# Patient Record
Sex: Female | Born: 1950 | Hispanic: No | Smoking: Former smoker
Health system: Southern US, Community
[De-identification: ages and names within clinical notes are randomized; demographics above are authoritative.]

## PROBLEM LIST (undated history)

## (undated) DIAGNOSIS — R7303 Prediabetes: Secondary | ICD-10-CM

## (undated) DIAGNOSIS — E079 Disorder of thyroid, unspecified: Secondary | ICD-10-CM

## (undated) DIAGNOSIS — T8859XA Other complications of anesthesia, initial encounter: Secondary | ICD-10-CM

## (undated) DIAGNOSIS — H269 Unspecified cataract: Secondary | ICD-10-CM

## (undated) DIAGNOSIS — F32A Depression, unspecified: Secondary | ICD-10-CM

## (undated) DIAGNOSIS — I10 Essential (primary) hypertension: Secondary | ICD-10-CM

## (undated) DIAGNOSIS — I454 Nonspecific intraventricular block: Secondary | ICD-10-CM

## (undated) DIAGNOSIS — E785 Hyperlipidemia, unspecified: Secondary | ICD-10-CM

## (undated) HISTORY — PX: CHOLECYSTECTOMY: SHX55

## (undated) HISTORY — PX: COSMETIC SURGERY: SHX468

## (undated) HISTORY — DX: Disorder of thyroid, unspecified: E07.9

## (undated) HISTORY — DX: Essential (primary) hypertension: I10

## (undated) HISTORY — DX: Prediabetes: R73.03

## (undated) HISTORY — PX: REDUCTION MAMMAPLASTY: SUR839

## (undated) HISTORY — PX: TONSILLECTOMY: SUR1361

## (undated) HISTORY — DX: Depression, unspecified: F32.A

## (undated) HISTORY — PX: EYE SURGERY: SHX253

## (undated) HISTORY — DX: Hyperlipidemia, unspecified: E78.5

## (undated) HISTORY — DX: Unspecified cataract: H26.9

## (undated) HISTORY — PX: BREAST SURGERY: SHX581

## (undated) HISTORY — PX: HERNIA REPAIR: SHX51

---

## 1978-12-21 DIAGNOSIS — E039 Hypothyroidism, unspecified: Secondary | ICD-10-CM

## 1978-12-21 HISTORY — DX: Hypothyroidism, unspecified: E03.9

## 1988-12-21 HISTORY — PX: NASAL SEPTUM SURGERY: SHX37

## 1993-12-21 DIAGNOSIS — D649 Anemia, unspecified: Secondary | ICD-10-CM

## 1993-12-21 HISTORY — DX: Anemia, unspecified: D64.9

## 2018-06-30 LAB — HM MAMMOGRAPHY

## 2018-07-11 LAB — HM PAP SMEAR: HM Pap smear: ABNORMAL

## 2019-09-25 ENCOUNTER — Ambulatory Visit (INDEPENDENT_AMBULATORY_CARE_PROVIDER_SITE_OTHER): Payer: Medicare HMO | Admitting: Nurse Practitioner

## 2019-09-25 ENCOUNTER — Encounter: Payer: Self-pay | Admitting: Nurse Practitioner

## 2019-09-25 ENCOUNTER — Other Ambulatory Visit: Payer: Self-pay

## 2019-09-25 VITALS — BP 132/80 | HR 61 | Temp 97.8°F | Ht 65.8 in | Wt 223.8 lb

## 2019-09-25 DIAGNOSIS — E782 Mixed hyperlipidemia: Secondary | ICD-10-CM

## 2019-09-25 DIAGNOSIS — E039 Hypothyroidism, unspecified: Secondary | ICD-10-CM

## 2019-09-25 DIAGNOSIS — H04123 Dry eye syndrome of bilateral lacrimal glands: Secondary | ICD-10-CM

## 2019-09-25 DIAGNOSIS — Z87898 Personal history of other specified conditions: Secondary | ICD-10-CM | POA: Diagnosis not present

## 2019-09-25 DIAGNOSIS — N182 Chronic kidney disease, stage 2 (mild): Secondary | ICD-10-CM

## 2019-09-25 DIAGNOSIS — I129 Hypertensive chronic kidney disease with stage 1 through stage 4 chronic kidney disease, or unspecified chronic kidney disease: Secondary | ICD-10-CM | POA: Diagnosis not present

## 2019-09-25 DIAGNOSIS — I10 Essential (primary) hypertension: Secondary | ICD-10-CM | POA: Insufficient documentation

## 2019-09-25 DIAGNOSIS — R7303 Prediabetes: Secondary | ICD-10-CM

## 2019-09-25 DIAGNOSIS — Z1231 Encounter for screening mammogram for malignant neoplasm of breast: Secondary | ICD-10-CM

## 2019-09-25 LAB — POCT URINALYSIS DIPSTICK
Bilirubin, UA: NEGATIVE
Blood, UA: NEGATIVE
Glucose, UA: NEGATIVE
Ketones, UA: NEGATIVE
Leukocytes, UA: NEGATIVE
Nitrite, UA: NEGATIVE
Protein, UA: NEGATIVE
Spec Grav, UA: 1.025 (ref 1.010–1.025)
Urobilinogen, UA: 0.2 E.U./dL
pH, UA: 5 (ref 5.0–8.0)

## 2019-09-25 LAB — POCT UA - MICROALBUMIN
Albumin/Creatinine Ratio, Urine, POC: 30
Creatinine, POC: 200 mg/dL
Microalbumin Ur, POC: 10 mg/L

## 2019-09-25 MED ORDER — LEVOTHYROXINE SODIUM 150 MCG PO TABS: 150.0000 ug | ORAL_TABLET | Freq: Every day | ORAL | 1 refills | Status: DC

## 2019-09-25 MED ORDER — SPIRONOLACTONE 50 MG PO TABS: 50.0000 mg | ORAL_TABLET | Freq: Every day | ORAL | 1 refills | Status: DC

## 2019-09-25 NOTE — Progress Notes (Signed)
Subjective:     Patient ID: Nancy [name] , female    DOB: [birth year] , 68 y.o.   MRN: [id number]   Chief Complaint  Patient presents with  . Establish Care  . Hypertension    patient needs a refill on spironlactone     HPI  Here to establish care - she relocated approximately from [place], [place] - Lucas Mallow MD - [phone].  The last time she was seen was January 2020.  She worked as an Tourist information centre manager, now retired.  Divorced. No children.  Brother live here.    She has lost 90 lbs over 1 1/2 years intentionally. She did this with diet and exercise.  She now has loose skin.     Hypertension This is a chronic problem. The current episode started more than 1 year ago. The problem is controlled. Pertinent negatives include no anxiety, chest pain, headaches or palpitations. There are no associated agents to hypertension. Risk factors for coronary artery disease include obesity (prediabetes). Past treatments include diuretics and lifestyle changes. There is no history of angina, kidney disease or CVA.  Diabetes She presents for her follow-up diabetic visit. Diabetes type: prediabetes. Pertinent negatives for hypoglycemia include no dizziness or headaches. Pertinent negatives for diabetes include no chest pain, no polydipsia, no polyphagia and no polyuria. There are no hypoglycemic complications. Pertinent negatives for diabetic complications include no CVA. Risk factors for coronary artery disease include hypertension and obesity. When asked about current treatments, none were reported. When asked about meal planning, she reported none. She has not had a previous visit with a dietitian. (Average blood sugars - 85)      Past Medical History:  Diagnosis Date  . Hyperlipidemia   . Hypertension   . Prediabetes   . Thyroid disease      Family History  Problem Relation Age of Onset  . Diabetes Mother   . Hypertension Mother   . Hypertension Father   . Heart disease Father   .  Diabetes Sister   . Kidney failure Sister   . Diabetes Brother   . Kidney disease Brother   . Diabetes Sister   . Diabetes Sister   . Asthma Sister   . Cancer Maternal Grandfather   . Cancer Paternal Grandfather      Current Outpatient Medications:  .  atorvastatin (LIPITOR) 20 MG tablet, Take 20 mg by mouth daily., Disp: , Rfl:  .  azelastine (OPTIVAR) 0.05 % ophthalmic solution, Place 1 drop into both eyes 2 (two) times daily., Disp: , Rfl:  .  glucose blood (FREESTYLE TEST STRIPS) test strip, 1 each by Other route as needed for other. Take 1 strip twice a day, Disp: , Rfl:  .  levothyroxine (SYNTHROID) 150 MCG tablet, Take 150 mcg by mouth daily before breakfast. Take 2 tablets on wed and Saturday and one tablet once a day the other days, Disp: , Rfl:  .  spironolactone (ALDACTONE) 50 MG tablet, Take 50 mg by mouth daily., Disp: , Rfl:  .  triamcinolone cream (KENALOG) 0.1 %, Apply 1 application topically as needed., Disp: , Rfl:    No Known Allergies   Review of Systems  Constitutional: Negative.   Respiratory: Negative.   Cardiovascular: Negative for chest pain, palpitations and leg swelling.  Endocrine: Negative for polydipsia, polyphagia and polyuria.  Allergic/Immunologic: Negative.   Neurological: Negative for dizziness and headaches.     Today's Vitals   09/25/19 0959  BP: 132/80  Pulse: 61  Temp: 97.8  F (36.6 C)  TempSrc: Oral  SpO2: 97%  Weight: 223 lb 12.8 oz (101.5 kg)  Height: 5' 5.8" (1.671 m)  PainSc: 0-No pain   Body mass index is 36.34 kg/m.   Objective:  Physical Exam Vitals signs reviewed.  Constitutional:      Appearance: Normal appearance.  HENT:     Head: Normocephalic.  Cardiovascular:     Rate and Rhythm: Normal rate and regular rhythm.     Pulses: Normal pulses.     Heart sounds: Normal heart sounds. No murmur.  Pulmonary:     Effort: Pulmonary effort is normal. No respiratory distress.     Breath sounds: Normal breath sounds.   Skin:    General: Skin is warm and dry.     Capillary Refill: Capillary refill takes less than 2 seconds.     Comments: Loose hanging skin noted to bilateral arms   Neurological:     General: No focal deficit present.     Mental Status: She is alert.  Psychiatric:        Mood and Affect: Mood normal.        Behavior: Behavior normal.        Thought Content: Thought content normal.        Judgment: Judgment normal.         Assessment And Plan:     1. Essential hypertension  Blood pressure is fairly controlled today.  - spironolactone (ALDACTONE) 50 MG tablet; Take 1 tablet (50 mg total) by mouth daily.  Dispense: 90 tablet; Refill: 1  2. Stage 2 chronic kidney disease  She reports a history of chronic kidney disease  Encouraged to avoid ibuprofen type medications  3. Mixed hyperlipidemia  Continue with current medications  Will check lipid panel  4. Dry eyes  Chronic, using eye drops regularly  5. Prediabetes  Will refer to opthalmology for her routine screening - Ambulatory referral to Ophthalmology  6. Encounter for screening mammogram for malignant neoplasm of breast  Pt instructed on Self Breast Exam.According to ACOG guidelines Women aged 91 and older are recommended to get an annual mammogram. Form completed and given to patient contact the [facility] for appointment scheduing.   Pt encouraged to get annual mammogram - MM Digital Screening; Future  7. Acquired hypothyroidism  Chronic,   Will check thyroid studies - levothyroxine (SYNTHROID) 150 MCG tablet; Take 1 tablet (150 mcg total) by mouth daily before breakfast.  Dispense: 90 tablet; Refill: 1  8. History of weight loss  Would like to consult about the excess skin since her intentional weight loss - Ambulatory referral to Plastic Surgery    [name], FNP    THE PATIENT IS ENCOURAGED TO PRACTICE SOCIAL DISTANCING DUE TO THE COVID-19 PANDEMIC.

## 2019-09-26 LAB — LIPID PANEL
Chol/HDL Ratio: 3.5 ratio (ref 0.0–4.4)
Cholesterol, Total: 235 mg/dL — ABNORMAL HIGH (ref 100–199)
HDL: 68 mg/dL (ref >39)
LDL Chol Calc (NIH): 151 mg/dL — ABNORMAL HIGH (ref 0–99)
Triglycerides: 92 mg/dL (ref 0–149)
VLDL Cholesterol Cal: 16 mg/dL (ref 5–40)

## 2019-09-26 LAB — TSH: TSH: 2.15 u[IU]/mL (ref 0.450–4.500)

## 2019-09-26 LAB — T3: T3, Total: 81 ng/dL (ref 71–180)

## 2019-10-02 ENCOUNTER — Other Ambulatory Visit: Payer: Self-pay

## 2019-10-02 ENCOUNTER — Other Ambulatory Visit: Payer: Medicare HMO

## 2019-10-02 ENCOUNTER — Other Ambulatory Visit: Payer: Self-pay | Admitting: Internal Medicine

## 2019-10-03 LAB — HEMOGLOBIN A1C
Est. average glucose Bld gHb Est-mCnc: 111 mg/dL
Hgb A1c MFr Bld: 5.5 % (ref 4.8–5.6)

## 2019-10-05 ENCOUNTER — Telehealth: Payer: Self-pay

## 2019-10-05 NOTE — Telephone Encounter (Signed)
Rodriguez-Southworth, Sunday Spillers, PA-C  [place], [place]        Please inform pt that her DM test is normal    Hemoglobin A1c Order: [id number] Status:  Final result Visible to patient:  No (not released)  Ref Range & Units 3d ago  Hgb A1c MFr Bld 4.8 - 5.6 % 5.5   Comment:     Prediabetes: 5.7 - 6.4      Diabetes: >6.4      Glycemic control for adults with diabetes: <7.0        Spoke w/pt

## 2019-10-23 DIAGNOSIS — H0288B Meibomian gland dysfunction left eye, upper and lower eyelids: Secondary | ICD-10-CM | POA: Diagnosis not present

## 2019-10-23 DIAGNOSIS — H25013 Cortical age-related cataract, bilateral: Secondary | ICD-10-CM | POA: Diagnosis not present

## 2019-10-23 DIAGNOSIS — H0288A Meibomian gland dysfunction right eye, upper and lower eyelids: Secondary | ICD-10-CM | POA: Diagnosis not present

## 2019-10-23 DIAGNOSIS — E119 Type 2 diabetes mellitus without complications: Secondary | ICD-10-CM | POA: Diagnosis not present

## 2019-10-23 DIAGNOSIS — H18413 Arcus senilis, bilateral: Secondary | ICD-10-CM | POA: Diagnosis not present

## 2019-10-23 DIAGNOSIS — H11153 Pinguecula, bilateral: Secondary | ICD-10-CM | POA: Diagnosis not present

## 2019-10-23 DIAGNOSIS — H2513 Age-related nuclear cataract, bilateral: Secondary | ICD-10-CM | POA: Diagnosis not present

## 2019-10-23 DIAGNOSIS — H11823 Conjunctivochalasis, bilateral: Secondary | ICD-10-CM | POA: Diagnosis not present

## 2019-10-23 LAB — HM DIABETES EYE EXAM

## 2019-10-25 ENCOUNTER — Encounter: Payer: Self-pay | Admitting: Nurse Practitioner

## 2019-10-31 ENCOUNTER — Other Ambulatory Visit: Payer: Self-pay | Admitting: Nurse Practitioner

## 2019-10-31 DIAGNOSIS — R69 Illness, unspecified: Secondary | ICD-10-CM | POA: Diagnosis not present

## 2019-11-06 DIAGNOSIS — M793 Panniculitis, unspecified: Secondary | ICD-10-CM | POA: Diagnosis not present

## 2019-11-06 DIAGNOSIS — E881 Lipodystrophy, not elsewhere classified: Secondary | ICD-10-CM | POA: Diagnosis not present

## 2019-12-05 ENCOUNTER — Telehealth: Payer: Self-pay

## 2019-12-05 NOTE — Telephone Encounter (Signed)
Patient called wanting some suggestions for diverticulitis and constipation.  I RETURNED PT CALL AND SCHEDULED HER AN APPOINTMENT. Nancy [name]

## 2019-12-06 ENCOUNTER — Encounter: Payer: Self-pay | Admitting: Nurse Practitioner

## 2019-12-06 ENCOUNTER — Ambulatory Visit (INDEPENDENT_AMBULATORY_CARE_PROVIDER_SITE_OTHER): Payer: Medicare HMO | Admitting: Nurse Practitioner

## 2019-12-06 ENCOUNTER — Ambulatory Visit
Admission: RE | Admit: 2019-12-06 | Discharge: 2019-12-06 | Disposition: A | Discharge status: Home / Self Care | Payer: Medicare HMO | Source: Ambulatory Visit | Attending: Nurse Practitioner | Admitting: Nurse Practitioner

## 2019-12-06 ENCOUNTER — Other Ambulatory Visit: Payer: Self-pay

## 2019-12-06 VITALS — BP 128/80 | HR 69 | Temp 97.9°F | Ht 65.8 in | Wt 218.0 lb

## 2019-12-06 DIAGNOSIS — Z1231 Encounter for screening mammogram for malignant neoplasm of breast: Secondary | ICD-10-CM | POA: Diagnosis not present

## 2019-12-06 DIAGNOSIS — K5900 Constipation, unspecified: Secondary | ICD-10-CM | POA: Diagnosis not present

## 2019-12-06 DIAGNOSIS — K5790 Diverticulosis of intestine, part unspecified, without perforation or abscess without bleeding: Secondary | ICD-10-CM | POA: Diagnosis not present

## 2019-12-06 DIAGNOSIS — Z8719 Personal history of other diseases of the digestive system: Secondary | ICD-10-CM

## 2019-12-06 NOTE — Progress Notes (Signed)
This visit occurred during the SARS-CoV-2 public health emergency.  Safety protocols were in place, including screening questions prior to the visit, additional usage of staff PPE, and extensive cleaning of exam room while observing appropriate contact time as indicated for disinfecting solutions.  Subjective:     Patient ID: Nancy [name] , female    DOB: 05/22/1951 , 68 y.o.   MRN: [id number]   Chief Complaint  Patient presents with  . Constipation    patient stated she has been having constipation since saturday. she stated she had a episode of diverticulitis     HPI  She seen Dr. Towanda Malkin for her abdominal hernia, scheduled to see him in January for possible surgery. She tells me she was diagnosed with diverticulitis - unable to complete colonoscopy with a CT scan found to have diverticulitis.   She is having bad abdominal pain, unsure of what triggers the pain. She had been eating a handful of nuts. Took 3 days to calm down.  Having a small amount of pain now.  She is trying to eat bland. Describes a sharp pain, hits and peaks then dies off.      Constipation This is a new problem. The current episode started in the past 7 days. The problem is unchanged. Her stool frequency is 2 to 3 times per week. The patient is not on a high fiber diet. She does not exercise regularly. There has been adequate water intake. Pertinent negatives include no abdominal pain (abdominal discomfort), back pain, bloating, diarrhea, fever, flatus, nausea (when she was having pain ) or vomiting. She has tried stool softeners (magnesium citrate 2 bottles, 2 dulcolax) for the symptoms.     Past Medical History:  Diagnosis Date  . Hyperlipidemia   . Hypertension   . Prediabetes   . Thyroid disease      Family History  Problem Relation Age of Onset  . Diabetes Mother   . Hypertension Mother   . Hypertension Father   . Heart disease Father   . Diabetes Sister   . Kidney failure Sister   . Diabetes  Brother   . Kidney disease Brother   . Diabetes Sister   . Diabetes Sister   . Asthma Sister   . Cancer Maternal Grandfather   . Cancer Paternal Grandfather      Current Outpatient Medications:  .  atorvastatin (LIPITOR) 20 MG tablet, TAKE 1 TABLET EVERY NIGHT AT BEDTIME FOR 90 DAY(S) FOR CHOLESTEROL, Disp: 90 tablet, Rfl: 0 .  azelastine (OPTIVAR) 0.05 % ophthalmic solution, Place 1 drop into both eyes 2 (two) times daily., Disp: , Rfl:  .  glucose blood (FREESTYLE TEST STRIPS) test strip, 1 each by Other route as needed for other. Take 1 strip twice a day, Disp: , Rfl:  .  levothyroxine (SYNTHROID) 150 MCG tablet, Take 1 tablet (150 mcg total) by mouth daily before breakfast., Disp: 90 tablet, Rfl: 1 .  spironolactone (ALDACTONE) 50 MG tablet, Take 1 tablet (50 mg total) by mouth daily., Disp: 90 tablet, Rfl: 1 .  triamcinolone cream (KENALOG) 0.1 %, TAKE 1 THIN LAYER TWICE A DAY AS NEEDED FOR 30 DAY(S), Disp: 60 g, Rfl: 0   No Known Allergies   Review of Systems  Constitutional: Negative for fatigue and fever.  Gastrointestinal: Positive for constipation. Negative for abdominal distention, abdominal pain (abdominal discomfort), bloating, diarrhea, flatus, nausea (when she was having pain ) and vomiting.  Genitourinary: Negative for frequency.  Musculoskeletal: Negative for back pain.  Neurological: Negative for headaches.  Psychiatric/Behavioral: Negative.      Today's Vitals   12/06/19 1138  BP: 128/80  Pulse: 69  Temp: 97.9 F (36.6 C)  TempSrc: Oral  Weight: 218 lb (98.9 kg)  Height: 5' 5.8" (1.671 m)  PainSc: 3   PainLoc: Abdomen   Body mass index is 35.4 kg/m.   Objective:  Physical Exam Constitutional:      General: She is not in acute distress.    Appearance: Normal appearance.  Cardiovascular:     Rate and Rhythm: Normal rate and regular rhythm.     Pulses: Normal pulses.     Heart sounds: Normal heart sounds. No murmur.  Pulmonary:     Effort:  Pulmonary effort is normal. No respiratory distress.     Breath sounds: Normal breath sounds.  Abdominal:     Palpations: Abdomen is soft.     Tenderness: There is abdominal tenderness (mild tenderness to lower abdomen area). There is no right CVA tenderness or left CVA tenderness.     Comments: Hypoactive bowel sounds  Neurological:     General: No focal deficit present.     Mental Status: She is alert and oriented to person, place, and time.     Cranial Nerves: No cranial nerve deficit.  Psychiatric:        Mood and Affect: Mood normal.        Behavior: Behavior normal.        Thought Content: Thought content normal.        Judgment: Judgment normal.         Assessment And Plan:     1. Constipation, unspecified constipation type  5 day history of no bowel movement  Given samples of linzess to take daily  She is also advised to take miralax 1 tsp daily with at least 8 oz of water  Increase fiber intake and can drink warmed prune juice or apple juice  Will obtain abdominal xray  She has hypoactive bowel sounds - DG Abd 1 View; Future  2. Diverticulosis  Previous history of diverticulosis  Advised to avoid foods with seeds and spicy foods  3. History of abdominal hernia  She is being follow by Dr. Smitty Pluck  May need to contact him since she has new onset constipation   [name], FNP    THE PATIENT IS ENCOURAGED TO PRACTICE SOCIAL DISTANCING DUE TO THE COVID-19 PANDEMIC.

## 2019-12-06 NOTE — Patient Instructions (Signed)
Diverticulosis  Diverticulosis is a condition that develops when small pouches (diverticula) form in the wall of the large intestine (colon). The colon is where water is absorbed and stool is formed. The pouches form when the inside layer of the colon pushes through weak spots in the outer layers of the colon. You may have a few pouches or many of them. What are the causes? The cause of this condition is not known. What increases the risk? The following factors may make you more likely to develop this condition:  Being older than age 60. Your risk for this condition increases with age. Diverticulosis is rare among people younger than age 30. By age 80, many people have it.  Eating a low-fiber diet.  Having frequent constipation.  Being overweight.  Not getting enough exercise.  Smoking.  Taking over-the-counter pain medicines, like aspirin and ibuprofen.  Having a family history of diverticulosis. What are the signs or symptoms? In most people, there are no symptoms of this condition. If you do have symptoms, they may include:  Bloating.  Cramps in the abdomen.  Constipation or diarrhea.  Pain in the lower left side of the abdomen. How is this diagnosed? This condition is most often diagnosed during an exam for other colon problems. Because diverticulosis usually has no symptoms, it often cannot be diagnosed independently. This condition may be diagnosed by:  Using a flexible scope to examine the colon (colonoscopy).  Taking an X-ray of the colon after dye has been put into the colon (barium enema).  Doing a CT scan. How is this treated? You may not need treatment for this condition if you have never developed an infection related to diverticulosis. If you have had an infection before, treatment may include:  Eating a high-fiber diet. This may include eating more fruits, vegetables, and grains.  Taking a fiber supplement.  Taking a live bacteria supplement (probiotic).   Taking medicine to relax your colon.  Taking antibiotic medicines. Follow these instructions at home:  Drink 6-8 glasses of water or more each day to prevent constipation.  Try not to strain when you have a bowel movement.  If you have had an infection before: ? Eat more fiber as directed by your health care provider or your diet and nutrition specialist (dietitian). ? Take a fiber supplement or probiotic, if your health care provider approves.  Take over-the-counter and prescription medicines only as told by your health care provider.  If you were prescribed an antibiotic, take it as told by your health care provider. Do not stop taking the antibiotic even if you start to feel better.  Keep all follow-up visits as told by your health care provider. This is important. Contact a health care provider if:  You have pain in your abdomen.  You have bloating.  You have cramps.  You have not had a bowel movement in 3 days. Get help right away if:  Your pain gets worse.  Your bloating becomes very bad.  You have a fever or chills, and your symptoms suddenly get worse.  You vomit.  You have bowel movements that are bloody or black.  You have bleeding from your rectum. Summary  Diverticulosis is a condition that develops when small pouches (diverticula) form in the wall of the large intestine (colon).  You may have a few pouches or many of them.  This condition is most often diagnosed during an exam for other colon problems.  If you have had an infection related to   diverticulosis, treatment may include increasing the fiber in your diet, taking supplements, or taking medicines. This information is not intended to replace advice given to you by your health care provider. Make sure you discuss any questions you have with your health care provider. Document Released: 09/03/2004 Document Revised: 11/19/2017 Document Reviewed: 10/26/2016 Elsevier Patient Education  [place]. Constipation, Adult Constipation is when a person:  Poops (has a bowel movement) fewer times in a week than normal.  Has a hard time pooping.  Has poop that is dry, hard, or bigger than normal. Follow these instructions at home: Eating and drinking   Eat foods that have a lot of fiber, such as: ? Fresh fruits and vegetables. ? Whole grains. ? Beans.  Eat less of foods that are high in fat, low in fiber, or overly processed, such as: ? [place] fries. ? Hamburgers. ? Cookies. ? Candy. ? Soda.  Drink enough fluid to keep your pee (urine) clear or pale yellow. General instructions  Exercise regularly or as told by your doctor.  Go to the restroom when you feel like you need to poop. Do not hold it in.  Take over-the-counter and prescription medicines only as told by your doctor. These include any fiber supplements.  Do pelvic floor retraining exercises, such as: ? Doing deep breathing while relaxing your lower belly (abdomen). ? Relaxing your pelvic floor while pooping.  Watch your condition for any changes.  Keep all follow-up visits as told by your doctor. This is important. Contact a doctor if:  You have pain that gets worse.  You have a fever.  You have not pooped for 4 days.  You throw up (vomit).  You are not hungry.  You lose weight.  You are bleeding from the anus.  You have thin, pencil-like poop (stool). Get help right away if:  You have a fever, and your symptoms suddenly get worse.  You leak poop or have blood in your poop.  Your belly feels hard or bigger than normal (is bloated).  You have very bad belly pain.  You feel dizzy or you faint. This information is not intended to replace advice given to you by your health care provider. Make sure you discuss any questions you have with your health care provider. Document Released: 05/25/2008 Document Revised: 11/19/2017 Document Reviewed: 05/27/2016 Elsevier Patient Education   2020 [employer].

## 2019-12-08 ENCOUNTER — Ambulatory Visit
Admission: RE | Admit: 2019-12-08 | Discharge: 2019-12-08 | Disposition: A | Discharge status: Home / Self Care | Payer: Medicare HMO | Source: Ambulatory Visit | Attending: Nurse Practitioner | Admitting: Nurse Practitioner

## 2019-12-08 ENCOUNTER — Other Ambulatory Visit: Payer: Self-pay

## 2019-12-08 DIAGNOSIS — R109 Unspecified abdominal pain: Secondary | ICD-10-CM | POA: Diagnosis not present

## 2019-12-08 DIAGNOSIS — K5900 Constipation, unspecified: Secondary | ICD-10-CM

## 2019-12-30 ENCOUNTER — Other Ambulatory Visit: Payer: Self-pay | Admitting: Nurse Practitioner

## 2019-12-30 DIAGNOSIS — E039 Hypothyroidism, unspecified: Secondary | ICD-10-CM

## 2020-01-03 DIAGNOSIS — K439 Ventral hernia without obstruction or gangrene: Secondary | ICD-10-CM | POA: Diagnosis not present

## 2020-01-09 ENCOUNTER — Ambulatory Visit: Payer: Medicare HMO

## 2020-01-09 ENCOUNTER — Encounter: Payer: Medicare HMO | Admitting: Nurse Practitioner

## 2020-01-11 ENCOUNTER — Ambulatory Visit (INDEPENDENT_AMBULATORY_CARE_PROVIDER_SITE_OTHER): Payer: Medicare HMO | Admitting: Nurse Practitioner

## 2020-01-11 ENCOUNTER — Encounter: Payer: Self-pay | Admitting: Nurse Practitioner

## 2020-01-11 ENCOUNTER — Other Ambulatory Visit: Payer: Self-pay

## 2020-01-11 ENCOUNTER — Ambulatory Visit (INDEPENDENT_AMBULATORY_CARE_PROVIDER_SITE_OTHER): Payer: Medicare HMO

## 2020-01-11 VITALS — BP 108/60 | HR 73 | Temp 97.6°F | Ht 65.4 in | Wt 206.0 lb

## 2020-01-11 VITALS — BP 108/60 | HR 73 | Temp 97.6°F | Ht 65.4 in | Wt 206.2 lb

## 2020-01-11 DIAGNOSIS — Z23 Encounter for immunization: Secondary | ICD-10-CM

## 2020-01-11 DIAGNOSIS — M25531 Pain in right wrist: Secondary | ICD-10-CM

## 2020-01-11 DIAGNOSIS — Z0001 Encounter for general adult medical examination with abnormal findings: Secondary | ICD-10-CM

## 2020-01-11 DIAGNOSIS — Z1159 Encounter for screening for other viral diseases: Secondary | ICD-10-CM

## 2020-01-11 DIAGNOSIS — I452 Bifascicular block: Secondary | ICD-10-CM

## 2020-01-11 DIAGNOSIS — E039 Hypothyroidism, unspecified: Secondary | ICD-10-CM | POA: Diagnosis not present

## 2020-01-11 DIAGNOSIS — R7303 Prediabetes: Secondary | ICD-10-CM | POA: Diagnosis not present

## 2020-01-11 DIAGNOSIS — R9431 Abnormal electrocardiogram [ECG] [EKG]: Secondary | ICD-10-CM | POA: Diagnosis not present

## 2020-01-11 DIAGNOSIS — I10 Essential (primary) hypertension: Secondary | ICD-10-CM

## 2020-01-11 DIAGNOSIS — Z0000 Encounter for general adult medical examination without abnormal findings: Secondary | ICD-10-CM | POA: Diagnosis not present

## 2020-01-11 DIAGNOSIS — K5900 Constipation, unspecified: Secondary | ICD-10-CM

## 2020-01-11 DIAGNOSIS — E782 Mixed hyperlipidemia: Secondary | ICD-10-CM | POA: Diagnosis not present

## 2020-01-11 DIAGNOSIS — Z01818 Encounter for other preprocedural examination: Secondary | ICD-10-CM | POA: Diagnosis not present

## 2020-01-11 MED ORDER — PREVNAR 13 IM SUSP: 0.5000 mL | INTRAMUSCULAR | 0 refills | Status: AC

## 2020-01-11 MED ORDER — TETANUS-DIPHTH-ACELL PERTUSSIS 5-2.5-18.5 LF-MCG/0.5 IM SUSP: 0.5000 mL | Freq: Once | INTRAMUSCULAR | 0 refills | Status: AC

## 2020-01-11 MED ORDER — LINACLOTIDE 72 MCG PO CAPS: 72.0000 ug | ORAL_CAPSULE | Freq: Every day | ORAL | 1 refills | Status: DC

## 2020-01-11 NOTE — Progress Notes (Signed)
This visit occurred during the SARS-CoV-2 public health emergency.  Safety protocols were in place, including screening questions prior to the visit, additional usage of staff PPE, and extensive cleaning of exam room while observing appropriate contact time as indicated for disinfecting solutions.  Subjective:     Patient ID: Nancy [name] , female    DOB: 01-16-1951 , 69 y.o.   MRN: [id number]   Chief Complaint  Patient presents with  . Annual Exam    HPI  Here for HM   She had a CT scan of her abdomen in less than a year and had a "twisted colon".    Dr. Towanda Malkin (plastic surgeon) is doing a panus on next Friday.  Would like a copy of physical note.     Dr. Thornell Mule (GI) in [facility] did her colonoscopy.   Hypertension This is a chronic problem. The current episode started more than 1 year ago. The problem is unchanged. The problem is controlled. Pertinent negatives include no anxiety, chest pain, headaches, palpitations or peripheral edema. There are no associated agents to hypertension. Risk factors for coronary artery disease include obesity and sedentary lifestyle. Past treatments include diuretics. There are no compliance problems.   Constipation The stool is described as firm. The patient is on a high fiber diet. She exercises regularly. There has been adequate water intake. Pertinent negatives include no abdominal pain, bloating or nausea. Treatments tried: linzess. The treatment provided moderate relief. There is no history of abdominal surgery. (Per patient history of twisted colon)    The patient states she uses post menopausal status for birth control. Mammogram last done 12/06/2019.  Negative for: breast discharge, breast lump(s), breast pain and breast self exam.  Pertinent negatives include abnormal bleeding (hematology), anxiety, decreased libido, depression, difficulty falling sleep, dyspareunia, history of infertility, nocturia, sexual dysfunction, sleep  disturbances, urinary incontinence, urinary urgency, vaginal discharge and vaginal itching. Diet regular, she is cutting back on carbs and meat intake. The patient states her exercise level moderate; exercises daily with walking for at least 30 minutes.     The patient's tobacco use is:  Social History   Tobacco Use  Smoking Status Former Smoker  Smokeless Tobacco Never Used   She has been exposed to passive smoke. The patient's alcohol use is:  Social History   Substance and Sexual Activity  Alcohol Use Yes   Comment: occasionally      Past Medical History:  Diagnosis Date  . Hyperlipidemia   . Hypertension   . Prediabetes   . Thyroid disease      Family History  Problem Relation Age of Onset  . Diabetes Mother   . Hypertension Mother   . Hypertension Father   . Heart disease Father   . Diabetes Sister   . Kidney failure Sister   . Diabetes Brother   . Kidney disease Brother   . Diabetes Sister   . Diabetes Sister   . Asthma Sister   . Cancer Maternal Grandfather   . Cancer Paternal Grandfather      Current Outpatient Medications:  .  atorvastatin (LIPITOR) 20 MG tablet, TAKE 1 TABLET EVERY NIGHT AT BEDTIME FOR 90 DAY(S) FOR CHOLESTEROL, Disp: 90 tablet, Rfl: 0 .  azelastine (OPTIVAR) 0.05 % ophthalmic solution, Place 1 drop into both eyes 2 (two) times daily., Disp: , Rfl:  .  glucose blood (FREESTYLE TEST STRIPS) test strip, 1 each by Other route as needed for other. Take 1 strip twice a day,  Disp: , Rfl:  .  levothyroxine (SYNTHROID) 150 MCG tablet, TAKE 2 TABLETS ON WED AND SATURDAY AND ONE TABLET ONCE A DAY THE OTHER DAYS, Disp: 180 tablet, Rfl: 0 .  spironolactone (ALDACTONE) 50 MG tablet, Take 1 tablet (50 mg total) by mouth daily., Disp: 90 tablet, Rfl: 1 .  triamcinolone cream (KENALOG) 0.1 %, TAKE 1 THIN LAYER TWICE A DAY AS NEEDED FOR 30 DAY(S), Disp: 60 g, Rfl: 0   No Known Allergies   Review of Systems  Constitutional: Negative.   HENT: Negative.    Eyes: Negative.   Respiratory: Negative.   Cardiovascular: Negative.  Negative for chest pain, palpitations and leg swelling.  Gastrointestinal: Positive for constipation. Negative for abdominal pain, bloating and nausea.  Endocrine: Negative.   Genitourinary: Negative.   Musculoskeletal: Negative.   Skin: Negative.   Neurological: Negative for dizziness and headaches.  Psychiatric/Behavioral: Negative.      Today's Vitals   01/11/20 0946  BP: 108/60  Pulse: 73  Temp: 97.6 F (36.4 C)  TempSrc: Oral  Weight: 206 lb 3.2 oz (93.5 kg)  Height: 5' 5.4" (1.661 m)  PainSc: 0-No pain   Body mass index is 33.9 kg/m.   Objective:  Physical Exam Constitutional:      General: She is not in acute distress.    Appearance: Normal appearance. She is obese.  HENT:     Head: Normocephalic and atraumatic.  Eyes:     Extraocular Movements: Extraocular movements intact.     Conjunctiva/sclera: Conjunctivae normal.     Pupils: Pupils are equal, round, and reactive to light.  Cardiovascular:     Rate and Rhythm: Normal rate and regular rhythm.     Pulses: Normal pulses.     Heart sounds: Normal heart sounds. No murmur.  Pulmonary:     Effort: Pulmonary effort is normal. No respiratory distress.     Breath sounds: Normal breath sounds.  Abdominal:     General: Bowel sounds are normal. There is no distension.     Palpations: Abdomen is soft.     Tenderness: There is no abdominal tenderness.  Musculoskeletal:        General: No tenderness. Normal range of motion.     Cervical back: Normal range of motion and neck supple. No tenderness.  Skin:    General: Skin is warm and dry.     Capillary Refill: Capillary refill takes less than 2 seconds.  Neurological:     General: No focal deficit present.     Mental Status: She is alert and oriented to person, place, and time.  Psychiatric:        Mood and Affect: Mood normal.        Behavior: Behavior normal.        Thought Content: Thought  content normal.        Judgment: Judgment normal.         Assessment And Plan:     1. Health maintenance examination . Behavior modifications discussed and diet history reviewed.   . Pt will continue to exercise regularly and modify diet with low GI, plant based foods and decrease intake of processed foods.  . Recommend intake of daily multivitamin, Vitamin D, and calcium.  . Recommend mammogram and colonoscopy for preventive screenings, as well as recommend immunizations that include influenza, TDAP, and Shingles  2. Encounter for hepatitis C screening test for low risk patient  Will check for Hepatitis C screening due to being born between the years  2992-4268 - Hepatitis C antibody  3. 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 Constipation, unspecified constipation type  Continue with Linzess daily - linaclotide (LINZESS) 72 MCG capsule; Take 1 capsule (72 mcg total) by mouth daily before breakfast.  Dispense: 90 capsule; Refill: 1  5. Mixed hyperlipidemia Chronic, controlled Continue with current medications - CMP14+EGFR - Lipid panel  6. Prediabetes  Chronic, stable  Continue to eat a healthy diet low in sugar and starches and regular physical activity - Hemoglobin A1c  7. Pre-operative clearance  Pending labs she can be cleared medically for surgery, I am referring to Cardiology for an evaluation due to abnormal EKG - CBC with Differential/Platelet  8. Acquired hypothyroidism  Chronic, controlled  Continue with current medications - TSH - T4 - T3, free  9. Encounter for immunization  Will give tetanus vaccine today while in  office. Refer to order management. TDAP will be administered to adults 75-69 years old every 10 years. - Tdap (BOOSTRIX) 5-2.5-18.5 LF-MCG/0.5 injection; Inject 0.5 mLs into the muscle once for 1 dose.  Dispense: 0.5 mL; Refill: 0  10. Right wrist pain  Bursitis vs arthritis vs carpal tunnel  Rx given for wrist splint  11. 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  Needs to be cleared before her surgery       [name], FNP    THE PATIENT IS ENCOURAGED TO PRACTICE SOCIAL DISTANCING DUE TO THE COVID-19 PANDEMIC.

## 2020-01-11 NOTE — Patient Instructions (Signed)
Nancy [name] , Thank you for taking time to come for your Medicare Wellness Visit. I appreciate your ongoing commitment to your health goals. Please review the following plan we discussed and let me know if I can assist you in the future.   Screening recommendations/referrals: Colonoscopy: 2020 per patient Mammogram: 11/2019 Bone Density: due Recommended yearly ophthalmology/optometry visit for glaucoma screening and checkup Recommended yearly dental visit for hygiene and checkup  Vaccinations: Influenza vaccine: 07/2019 Pneumococcal vaccine: sent to pharmacy Tdap vaccine: sent to pharmacy Shingles vaccine: discussed    Advanced directives: Advance directive discussed with you today. I have provided a copy for you to complete at home and have notarized. Once this is complete please bring a copy in to our office so we can scan it into your chart.   Conditions/risks identified: obesity  Next appointment:    Preventive Care 90 Years and Older, Female Preventive care refers to lifestyle choices and visits with your health care provider that can promote health and wellness. What does preventive care include?  A yearly physical exam. This is also called an annual well check.  Dental exams once or twice a year.  Routine eye exams. Ask your health care provider how often you should have your eyes checked.  Personal lifestyle choices, including:  Daily care of your teeth and gums.  Regular physical activity.  Eating a healthy diet.  Avoiding tobacco and drug use.  Limiting alcohol use.  Practicing safe sex.  Taking low-dose aspirin every day.  Taking vitamin and mineral supplements as recommended by your health care provider. What happens during an annual well check? The services and screenings done by your health care provider during your annual well check will depend on your age, overall health, lifestyle risk factors, and family history of disease. Counseling  Your health  care provider may ask you questions about your:  Alcohol use.  Tobacco use.  Drug use.  Emotional well-being.  Home and relationship well-being.  Sexual activity.  Eating habits.  History of falls.  Memory and ability to understand (cognition).  Work and work Statistician.  Reproductive health. Screening  You may have the following tests or measurements:  Height, weight, and BMI.  Blood pressure.  Lipid and cholesterol levels. These may be checked every 5 years, or more frequently if you are over 67 years old.  Skin check.  Lung cancer screening. You may have this screening every year starting at age 60 if you have a 30-pack-year history of smoking and currently smoke or have quit within the past 15 years.  Fecal occult blood test (FOBT) of the stool. You may have this test every year starting at age 46.  Flexible sigmoidoscopy or colonoscopy. You may have a sigmoidoscopy every 5 years or a colonoscopy every 10 years starting at age 85.  Hepatitis C blood test.  Hepatitis B blood test.  Sexually transmitted disease (STD) testing.  Diabetes screening. This is done by checking your blood sugar (glucose) after you have not eaten for a while (fasting). You may have this done every 1-3 years.  Bone density scan. This is done to screen for osteoporosis. You may have this done starting at age 14.  Mammogram. This may be done every 1-2 years. Talk to your health care provider about how often you should have regular mammograms. Talk with your health care provider about your test results, treatment options, and if necessary, the need for more tests. Vaccines  Your health care provider may recommend certain  vaccines, such as:  Influenza vaccine. This is recommended every year.  Tetanus, diphtheria, and acellular pertussis (Tdap, Td) vaccine. You may need a Td booster every 10 years.  Zoster vaccine. You may need this after age 3.  Pneumococcal 13-valent conjugate  (PCV13) vaccine. One dose is recommended after age 76.  Pneumococcal polysaccharide (PPSV23) vaccine. One dose is recommended after age 48. Talk to your health care provider about which screenings and vaccines you need and how often you need them. This information is not intended to replace advice given to you by your health care provider. Make sure you discuss any questions you have with your health care provider. Document Released: 01/03/2016 Document Revised: 08/26/2016 Document Reviewed: 10/08/2015 Elsevier Interactive Patient Education  2017 [place] Prevention in the Home Falls can cause injuries. They can happen to people of all ages. There are many things you can do to make your home safe and to help prevent falls. What can I do on the outside of my home?  Regularly fix the edges of walkways and driveways and fix any cracks.  Remove anything that might make you trip as you walk through a door, such as a raised step or threshold.  Trim any bushes or trees on the path to your home.  Use bright outdoor lighting.  Clear any walking paths of anything that might make someone trip, such as rocks or tools.  Regularly check to see if handrails are loose or broken. Make sure that both sides of any steps have handrails.  Any raised decks and porches should have guardrails on the edges.  Have any leaves, snow, or ice cleared regularly.  Use sand or salt on walking paths during winter.  Clean up any spills in your garage right away. This includes oil or grease spills. What can I do in the bathroom?  Use night lights.  Install grab bars by the toilet and in the tub and shower. Do not use towel bars as grab bars.  Use non-skid mats or decals in the tub or shower.  If you need to sit down in the shower, use a plastic, non-slip stool.  Keep the floor dry. Clean up any water that spills on the floor as soon as it happens.  Remove soap buildup in the tub or shower  regularly.  Attach bath mats securely with double-sided non-slip rug tape.  Do not have throw rugs and other things on the floor that can make you trip. What can I do in the bedroom?  Use night lights.  Make sure that you have a light by your bed that is easy to reach.  Do not use any sheets or blankets that are too big for your bed. They should not hang down onto the floor.  Have a firm chair that has side arms. You can use this for support while you get dressed.  Do not have throw rugs and other things on the floor that can make you trip. What can I do in the kitchen?  Clean up any spills right away.  Avoid walking on wet floors.  Keep items that you use a lot in easy-to-reach places.  If you need to reach something above you, use a strong step stool that has a grab bar.  Keep electrical cords out of the way.  Do not use floor polish or wax that makes floors slippery. If you must use wax, use non-skid floor wax.  Do not have throw rugs and other things  on the floor that can make you trip. What can I do with my stairs?  Do not leave any items on the stairs.  Make sure that there are handrails on both sides of the stairs and use them. Fix handrails that are broken or loose. Make sure that handrails are as long as the stairways.  Check any carpeting to make sure that it is firmly attached to the stairs. Fix any carpet that is loose or worn.  Avoid having throw rugs at the top or bottom of the stairs. If you do have throw rugs, attach them to the floor with carpet tape.  Make sure that you have a light switch at the top of the stairs and the bottom of the stairs. If you do not have them, ask someone to add them for you. What else can I do to help prevent falls?  Wear shoes that:  Do not have high heels.  Have rubber bottoms.  Are comfortable and fit you well.  Are closed at the toe. Do not wear sandals.  If you use a stepladder:  Make sure that it is fully  opened. Do not climb a closed stepladder.  Make sure that both sides of the stepladder are locked into place.  Ask someone to hold it for you, if possible.  Clearly mark and make sure that you can see:  Any grab bars or handrails.  First and last steps.  Where the edge of each step is.  Use tools that help you move around (mobility aids) if they are needed. These include:  Canes.  Walkers.  Scooters.  Crutches.  Turn on the lights when you go into a dark area. Replace any light bulbs as soon as they burn out.  Set up your furniture so you have a clear path. Avoid moving your furniture around.  If any of your floors are uneven, fix them.  If there are any pets around you, be aware of where they are.  Review your medicines with your doctor. Some medicines can make you feel dizzy. This can increase your chance of falling. Ask your doctor what other things that you can do to help prevent falls. This information is not intended to replace advice given to you by your health care provider. Make sure you discuss any questions you have with your health care provider. Document Released: 10/03/2009 Document Revised: 05/14/2016 Document Reviewed: 01/11/2015 Elsevier Interactive Patient Education  2017 [employer].

## 2020-01-11 NOTE — Progress Notes (Signed)
This visit occurred during the SARS-CoV-2 public health emergency.  Safety protocols were in place, including screening questions prior to the visit, additional usage of staff PPE, and extensive cleaning of exam room while observing appropriate contact time as indicated for disinfecting solutions.  Subjective:   Nancy [name] is a 69 y.o. female who presents for Medicare Annual (Subsequent) preventive examination.  Review of Systems:  n/a Cardiac Risk Factors include: advanced age (>65men, >25 women);dyslipidemia;obesity (BMI >30kg/m2);hypertension     Objective:     Vitals: BP 108/60 (BP Location: Left Arm, Patient Position: Sitting)   Pulse 73   Temp 97.6 F (36.4 C) (Oral)   Ht 5' 5.4" (1.661 m)   Wt 206 lb (93.4 kg)   BMI 33.86 kg/m   Body mass index is 33.86 kg/m.  Advanced Directives 01/11/2020  Does Patient Have a Medical Advance Directive? No  Would patient like information on creating a medical advance directive? Yes (MAU/Ambulatory/Procedural Areas - Information given)    Tobacco Social History   Tobacco Use  Smoking Status Former Smoker  Smokeless Tobacco Never Used     Counseling given: Not Answered   Clinical Intake:  Pre-visit preparation completed: Yes  Pain : No/denies pain     Nutritional Status: BMI > 30  Obese Nutritional Risks: None Diabetes: No  How often do you need to have someone help you when you read instructions, pamphlets, or other written materials from your doctor or pharmacy?: 1 - Never What is the last grade level you completed in school?: master's degree  Interpreter Needed?: No  Information entered by :: NAllen LPN  Past Medical History:  Diagnosis Date  . Hyperlipidemia   . Hypertension   . Prediabetes   . Thyroid disease    Past Surgical History:  Procedure Laterality Date  . BREAST SURGERY    . CHOLECYSTECTOMY    . REDUCTION MAMMAPLASTY    . TONSILLECTOMY     Family History  Problem Relation Age of Onset  .  Diabetes Mother   . Hypertension Mother   . Hypertension Father   . Heart disease Father   . Diabetes Sister   . Kidney failure Sister   . Diabetes Brother   . Kidney disease Brother   . Diabetes Sister   . Diabetes Sister   . Asthma Sister   . Cancer Maternal Grandfather   . Cancer Paternal Grandfather    Social History   Socioeconomic History  . Marital status: Unknown    Spouse name: Not on file  . Number of children: Not on file  . Years of education: Not on file  . Highest education level: Not on file  Occupational History  . Occupation: retired  Tobacco Use  . Smoking status: Former Research scientist (life sciences)  . Smokeless tobacco: Never Used  Substance and Sexual Activity  . Alcohol use: Not Currently  . Drug use: Never  . Sexual activity: Not Currently  Other Topics Concern  . Not on file  Social History Narrative  . Not on file   Social Determinants of Health   Financial Resource Strain: Low Risk   . Difficulty of Paying Living Expenses: Not hard at all  Food Insecurity: No Food Insecurity  . Worried About Charity fundraiser in the Last Year: Never true  . Ran Out of Food in the Last Year: Never true  Transportation Needs: No Transportation Needs  . Lack of Transportation (Medical): No  . Lack of Transportation (Non-Medical): No  Physical Activity: Sufficiently  Active  . Days of Exercise per Week: 7 days  . Minutes of Exercise per Session: 90 min  Stress: No Stress Concern Present  . Feeling of Stress : Not at all  Social Connections:   . Frequency of Communication with Friends and Family: Not on file  . Frequency of Social Gatherings with Friends and Family: Not on file  . Attends Religious Services: Not on file  . Active Member of Clubs or Organizations: Not on file  . Attends Archivist Meetings: Not on file  . Marital Status: Not on file    Outpatient Encounter Medications as of 01/11/2020  Medication Sig  . atorvastatin (LIPITOR) 20 MG tablet TAKE 1  TABLET EVERY NIGHT AT BEDTIME FOR 90 DAY(S) FOR CHOLESTEROL  . azelastine (OPTIVAR) 0.05 % ophthalmic solution Place 1 drop into both eyes 2 (two) times daily.  Marland Kitchen glucose blood (FREESTYLE TEST STRIPS) test strip 1 each by Other route as needed for other. Take 1 strip twice a day  . levothyroxine (SYNTHROID) 150 MCG tablet TAKE 2 TABLETS ON WED AND SATURDAY AND ONE TABLET ONCE A DAY THE OTHER DAYS  . linaclotide (LINZESS) 72 MCG capsule Take 1 capsule (72 mcg total) by mouth daily before breakfast.  . pneumococcal 13-valent conjugate vaccine (PREVNAR 13) SUSP injection Inject 0.5 mLs into the muscle tomorrow at 10 am for 1 dose.  . spironolactone (ALDACTONE) 50 MG tablet Take 1 tablet (50 mg total) by mouth daily.  Marland Kitchen triamcinolone cream (KENALOG) 0.1 % TAKE 1 THIN LAYER TWICE A DAY AS NEEDED FOR 30 DAY(S)   No facility-administered encounter medications on file as of 01/11/2020.    Activities of Daily Living In your present state of health, do you have any difficulty performing the following activities: 01/11/2020 09/25/2019  Hearing? N N  Vision? N N  Difficulty concentrating or making decisions? N N  Walking or climbing stairs? N N  Dressing or bathing? N N  Doing errands, shopping? N N  Preparing Food and eating ? N -  Using the Toilet? N -  In the past six months, have you accidently leaked urine? N -  Do you have problems with loss of bowel control? N -  Managing your Medications? N -  Managing your Finances? N -  Housekeeping or managing your Housekeeping? N -    Patient Care Team: [name], FNP as PCP - General (General Practice)    Assessment:   This is a routine wellness examination for Merna.  Exercise Activities and Dietary recommendations Current Exercise Habits: Home exercise routine, Type of exercise: walking, Time (Minutes): > 60, Frequency (Times/Week): 7, Weekly Exercise (Minutes/Week): 0  Goals    . Patient Stated     01/11/2020, wants to get down to 160-170  pounds       Fall Risk Fall Risk  01/11/2020 01/11/2020 12/06/2019 09/25/2019  Falls in the past year? 1 0 0 1  Comment tripped - - -  Number falls in past yr: - - - 0  Injury with Fall? - - - 0  Risk for fall due to : History of fall(s) - - -  Follow up Falls evaluation completed;Education provided;Falls prevention discussed - - -   Is the patient's home free of loose throw rugs in walkways, pet beds, electrical cords, etc?   yes      Grab bars in the bathroom? yes      Handrails on the stairs?   n/a  Adequate lighting?   yes  Timed Get Up and Go performed: n/a  Depression Screen PHQ 2/9 Scores 01/11/2020 01/11/2020 12/06/2019 09/25/2019  PHQ - 2 Score 0 0 0 0  PHQ- 9 Score 0 - - -     Cognitive Function     6CIT Screen 01/11/2020  What Year? 0 points  What month? 0 points  What time? 0 points  Count back from 20 0 points  Months in reverse 0 points  Repeat phrase 0 points  Total Score 0    Immunization History  Administered Date(s) Administered  . Influenza,inj,Quad PF,6+ Mos 08/13/2019    Qualifies for Shingles Vaccine? yes  Screening Tests Health Maintenance  Topic Date Due  . TETANUS/TDAP  04/30/1970  . DEXA SCAN  04/30/2016  . PNA vac Low Risk Adult (1 of 2 - PCV13) 04/30/2016  . COLONOSCOPY  01/10/2021 (Originally 04/30/2001)  . HEMOGLOBIN A1C  04/01/2020  . URINE MICROALBUMIN  09/24/2020  . OPHTHALMOLOGY EXAM  10/22/2020  . FOOT EXAM  01/10/2021  . MAMMOGRAM  12/05/2021  . INFLUENZA VACCINE  Completed  . Hepatitis C Screening  Completed    Cancer Screenings: Lung: Low Dose CT Chest recommended if Age 38-80 years, 30 pack-year currently smoking OR have quit w/in 15years. Patient does not qualify. Breast:  Up to date on Mammogram? Yes   Up to date of Bone Density/Dexa? No Colorectal: up to date  Additional Screenings: : Hepatitis C Screening: today     Plan:    Patient would like to get more in to exercise and weigh 160-170 pounds.   I  have personally reviewed and noted the following in the patient's chart:   . Medical and social history . Use of alcohol, tobacco or illicit drugs  . Current medications and supplements . Functional ability and status . Nutritional status . Physical activity . Advanced directives . List of other physicians . Hospitalizations, surgeries, and ER visits in previous 12 months . Vitals . Screenings to include cognitive, depression, and falls . Referrals and appointments  In addition, I have reviewed and discussed with patient certain preventive protocols, quality metrics, and best practice recommendations. A written personalized care plan for preventive services as well as general preventive health recommendations were provided to patient.     Kellie Simmering, LPN  [phone]

## 2020-01-12 LAB — HEMOGLOBIN A1C
Est. average glucose Bld gHb Est-mCnc: 117 mg/dL
Hgb A1c MFr Bld: 5.7 % — ABNORMAL HIGH (ref 4.8–5.6)

## 2020-01-12 LAB — CBC WITH DIFFERENTIAL/PLATELET
Basophils Absolute: 0.1 10*3/uL (ref 0.0–0.2)
Basos: 1 %
EOS (ABSOLUTE): 0.2 10*3/uL (ref 0.0–0.4)
Eos: 3 %
Hematocrit: 44.7 % (ref 34.0–46.6)
Hemoglobin: 14.9 g/dL (ref 11.1–15.9)
Immature Grans (Abs): 0 10*3/uL (ref 0.0–0.1)
Immature Granulocytes: 0 %
Lymphocytes Absolute: 1.9 10*3/uL (ref 0.7–3.1)
Lymphs: 32 %
MCH: 30.3 pg (ref 26.6–33.0)
MCHC: 33.3 g/dL (ref 31.5–35.7)
MCV: 91 fL (ref 79–97)
Monocytes Absolute: 0.4 10*3/uL (ref 0.1–0.9)
Monocytes: 7 %
Neutrophils Absolute: 3.4 10*3/uL (ref 1.4–7.0)
Neutrophils: 57 %
Platelets: 244 10*3/uL (ref 150–450)
RBC: 4.92 x10E6/uL (ref 3.77–5.28)
RDW: 13.5 % (ref 11.7–15.4)
WBC: 6 10*3/uL (ref 3.4–10.8)

## 2020-01-12 LAB — CMP14+EGFR
ALT: 16 IU/L (ref 0–32)
AST: 20 IU/L (ref 0–40)
Albumin/Globulin Ratio: 1.8 (ref 1.2–2.2)
Albumin: 4.7 g/dL (ref 3.8–4.8)
Alkaline Phosphatase: 56 IU/L (ref 39–117)
BUN/Creatinine Ratio: 14 (ref 12–28)
BUN: 14 mg/dL (ref 8–27)
Bilirubin Total: 0.6 mg/dL (ref 0.0–1.2)
CO2: 25 mmol/L (ref 20–29)
Calcium: 9.9 mg/dL (ref 8.7–10.3)
Chloride: 105 mmol/L (ref 96–106)
Creatinine, Ser: 0.99 mg/dL (ref 0.57–1.00)
GFR calc Af Amer: 68 mL/min/{1.73_m2} (ref >59)
GFR calc non Af Amer: 59 mL/min/{1.73_m2} — ABNORMAL LOW (ref >59)
Globulin, Total: 2.6 g/dL (ref 1.5–4.5)
Glucose: 78 mg/dL (ref 65–99)
Potassium: 4.2 mmol/L (ref 3.5–5.2)
Sodium: 143 mmol/L (ref 134–144)
Total Protein: 7.3 g/dL (ref 6.0–8.5)

## 2020-01-12 LAB — HEPATITIS C ANTIBODY: Hep C Virus Ab: <0.1 s/co ratio (ref 0.0–0.9)

## 2020-01-12 LAB — TSH: TSH: 0.317 u[IU]/mL — ABNORMAL LOW (ref 0.450–4.500)

## 2020-01-12 LAB — LIPID PANEL
Chol/HDL Ratio: 3.2 ratio (ref 0.0–4.4)
Cholesterol, Total: 198 mg/dL (ref 100–199)
HDL: 62 mg/dL (ref >39)
LDL Chol Calc (NIH): 122 mg/dL — ABNORMAL HIGH (ref 0–99)
Triglycerides: 78 mg/dL (ref 0–149)
VLDL Cholesterol Cal: 14 mg/dL (ref 5–40)

## 2020-01-12 LAB — T4: T4, Total: 10.3 ug/dL (ref 4.5–12.0)

## 2020-01-12 LAB — T3, FREE: T3, Free: 2.8 pg/mL (ref 2.0–4.4)

## 2020-01-15 ENCOUNTER — Other Ambulatory Visit: Payer: Self-pay

## 2020-01-15 DIAGNOSIS — R9431 Abnormal electrocardiogram [ECG] [EKG]: Secondary | ICD-10-CM

## 2020-01-15 NOTE — Patient Instructions (Signed)
Health Maintenance  Topic Date Due  . TETANUS/TDAP  04/30/1970  . DEXA SCAN  04/30/2016  . PNA vac Low Risk Adult (1 of 2 - PCV13) 04/30/2016  . COLONOSCOPY  01/10/2021 (Originally 04/30/2001)  . HEMOGLOBIN A1C  07/10/2020  . URINE MICROALBUMIN  09/24/2020  . OPHTHALMOLOGY EXAM  10/22/2020  . FOOT EXAM  01/10/2021  . MAMMOGRAM  12/05/2021  . INFLUENZA VACCINE  Completed  . Hepatitis C Screening  Completed   Health Maintenance After Age 69 After age 53, you are at a higher risk for certain long-term diseases and infections as well as injuries from falls. Falls are a major cause of broken bones and head injuries in people who are older than age 38. Getting regular preventive care can help to keep you healthy and well. Preventive care includes getting regular testing and making lifestyle changes as recommended by your health care provider. Talk with your health care provider about:  Which screenings and tests you should have. A screening is a test that checks for a disease when you have no symptoms.  A diet and exercise plan that is right for you. What should I know about screenings and tests to prevent falls? Screening and testing are the best ways to find a health problem early. Early diagnosis and treatment give you the best chance of managing medical conditions that are common after age 17. Certain conditions and lifestyle choices may make you more likely to have a fall. Your health care provider may recommend:  Regular vision checks. Poor vision and conditions such as cataracts can make you more likely to have a fall. If you wear glasses, make sure to get your prescription updated if your vision changes.  Medicine review. Work with your health care provider to regularly review all of the medicines you are taking, including over-the-counter medicines. Ask your health care provider about any side effects that may make you more likely to have a fall. Tell your health care provider if any  medicines that you take make you feel dizzy or sleepy.  Osteoporosis screening. Osteoporosis is a condition that causes the bones to get weaker. This can make the bones weak and cause them to break more easily.  Blood pressure screening. Blood pressure changes and medicines to control blood pressure can make you feel dizzy.  Strength and balance checks. Your health care provider may recommend certain tests to check your strength and balance while standing, walking, or changing positions.  Foot health exam. Foot pain and numbness, as well as not wearing proper footwear, can make you more likely to have a fall.  Depression screening. You may be more likely to have a fall if you have a fear of falling, feel emotionally low, or feel unable to do activities that you used to do.  Alcohol use screening. Using too much alcohol can affect your balance and may make you more likely to have a fall. What actions can I take to lower my risk of falls? General instructions  Talk with your health care provider about your risks for falling. Tell your health care provider if: ? You fall. Be sure to tell your health care provider about all falls, even ones that seem minor. ? You feel dizzy, sleepy, or off-balance.  Take over-the-counter and prescription medicines only as told by your health care provider. These include any supplements.  Eat a healthy diet and maintain a healthy weight. A healthy diet includes low-fat dairy products, low-fat (lean) meats, and fiber from  whole grains, beans, and lots of fruits and vegetables. Home safety  Remove any tripping hazards, such as rugs, cords, and clutter.  Install safety equipment such as grab bars in bathrooms and safety rails on stairs.  Keep rooms and walkways well-lit. Activity   Follow a regular exercise program to stay fit. This will help you maintain your balance. Ask your health care provider what types of exercise are appropriate for you.  If you  need a cane or walker, use it as recommended by your health care provider.  Wear supportive shoes that have nonskid soles. Lifestyle  Do not drink alcohol if your health care provider tells you not to drink.  If you drink alcohol, limit how much you have: ? 0-1 drink a day for women. ? 0-2 drinks a day for men.  Be aware of how much alcohol is in your drink. In the [place] one drink equals one typical bottle of beer (12 oz), one-half glass of wine (5 oz), or one shot of hard liquor (1 oz).  Do not use any products that contain nicotine or tobacco, such as cigarettes and e-cigarettes. If you need help quitting, ask your health care provider. Summary  Having a healthy lifestyle and getting preventive care can help to protect your health and wellness after age 28.  Screening and testing are the best way to find a health problem early and help you avoid having a fall. Early diagnosis and treatment give you the best chance for managing medical conditions that are more common for people who are older than age 3.  Falls are a major cause of broken bones and head injuries in people who are older than age 88. Take precautions to prevent a fall at home.  Work with your health care provider to learn what changes you can make to improve your health and wellness and to prevent falls. This information is not intended to replace advice given to you by your health care provider. Make sure you discuss any questions you have with your health care provider. Document Revised: 03/30/2019 Document Reviewed: 10/20/2017 Elsevier Patient Education  2020 [employer].

## 2020-01-17 ENCOUNTER — Encounter: Payer: Self-pay | Admitting: Nurse Practitioner

## 2020-01-18 ENCOUNTER — Encounter: Payer: Self-pay | Admitting: Nurse Practitioner

## 2020-01-24 ENCOUNTER — Telehealth: Payer: Self-pay | Admitting: Nurse Practitioner

## 2020-01-24 NOTE — Telephone Encounter (Signed)
Spoke with the patient via phone to explain more in detail of her results of her EKG, re-assured the referral was not an urgent referral but needed to be addressed before she has her surgery. Once cardiology clears her she is cleared medically for her surgery. After review of her medical records from her previous provider the notes indicate her sister is deceased from cardiogenic shock, endocarditis, septic shock and aortic stenosis.

## 2020-01-26 ENCOUNTER — Other Ambulatory Visit: Payer: Self-pay | Admitting: Nurse Practitioner

## 2020-01-29 DIAGNOSIS — R69 Illness, unspecified: Secondary | ICD-10-CM | POA: Diagnosis not present

## 2020-01-30 DIAGNOSIS — R69 Illness, unspecified: Secondary | ICD-10-CM | POA: Diagnosis not present

## 2020-02-08 ENCOUNTER — Encounter: Payer: Self-pay | Admitting: Cardiology

## 2020-02-08 ENCOUNTER — Encounter: Payer: Self-pay | Admitting: *Deleted

## 2020-02-08 ENCOUNTER — Other Ambulatory Visit: Payer: Self-pay

## 2020-02-08 ENCOUNTER — Telehealth (INDEPENDENT_AMBULATORY_CARE_PROVIDER_SITE_OTHER): Payer: Medicare HMO | Admitting: Cardiology

## 2020-02-08 VITALS — Ht 65.4 in | Wt 207.0 lb

## 2020-02-08 DIAGNOSIS — I4510 Unspecified right bundle-branch block: Secondary | ICD-10-CM

## 2020-02-08 DIAGNOSIS — Z8249 Family history of ischemic heart disease and other diseases of the circulatory system: Secondary | ICD-10-CM | POA: Insufficient documentation

## 2020-02-08 DIAGNOSIS — I452 Bifascicular block: Secondary | ICD-10-CM | POA: Diagnosis not present

## 2020-02-08 DIAGNOSIS — Z01810 Encounter for preprocedural cardiovascular examination: Secondary | ICD-10-CM | POA: Diagnosis not present

## 2020-02-08 DIAGNOSIS — R9431 Abnormal electrocardiogram [ECG] [EKG]: Secondary | ICD-10-CM

## 2020-02-08 DIAGNOSIS — I444 Left anterior fascicular block: Secondary | ICD-10-CM | POA: Insufficient documentation

## 2020-02-08 NOTE — Progress Notes (Signed)
Virtual Visit via Video Note   This visit type was conducted due to national recommendations for restrictions regarding the COVID-19 Pandemic (e.g. social distancing) in an effort to limit this patient's exposure and mitigate transmission in our community.  Due to her co-morbid illnesses, this patient is at least at moderate risk for complications without adequate follow up.  This format is felt to be most appropriate for this patient at this time.  All issues noted in this document were discussed and addressed.  A limited physical exam was performed with this format.  Please refer to the patient's chart for her consent to telehealth for Nancy [name].   Date:  02/08/2020   ID:  Nancy [name], DOB 10/01/1951, MRN [id number]  Patient Location: Home Provider Location: Home  PCP:  Nancy Brine, FNP  Cardiologist:  No primary care provider on file. Nancy [name] Electrophysiologist:  None   Evaluation Performed:  New Patient Evaluation  Chief Complaint: Abnormal EKG  History of Present Illness:    Nancy [name] is a 69 y.o. female here for preoperative evaluation and evaluation of abnormal EKG.    Dr. Towanda [name] (plastic surgeon) is to perform a pannus removal.  She has had approximately 115 pound weight loss over the past 2 years.  She is being treated for hypertension.  She had an EKG performed during her preoperative office visit that was abnormal.  This EKG personally reviewed and interpreted from 01/15/2020 shows a sinus rhythm, PAC, right bundle branch block with left anterior fascicular block.  There is no old EKG for comparison.  She remembers that years ago she had an EKG on "ticker tape "and was sent to a cardiologist who performed a stress test that was normal.  She does not recall the words right bundle branch block but my hunch is that this may have been present for years.  Overall, she tries to walk the treadmill every day, over 2 hours she walks approximately 5 miles.  She thinks  that she would be able to traverse 1-2 flights of stairs without major difficulty.  Potentially would be short winded but not severe.  She denies any syncopal episodes, no significant chest pain, no bleeding no fevers no chills.  She smoked for 1 year out of her life and quit many years ago.  She states that her father had early coronary artery disease with his first heart attack in his 22s.  Her lab work was unremarkable.  LDL cholesterol however mildly elevated at 122.  Creatinine was 0.9.  Potassium 4.2 hemoglobin 14.9.   Her sister died from cardiogenic shock endocarditis septic shock and aortic stenosis.  She had end-stage renal disease.  The patient does not have symptoms concerning for COVID-19 infection (fever, chills, cough, or new shortness of breath).    Past Medical History:  Diagnosis Date  . Hyperlipidemia   . Hypertension   . Prediabetes   . Thyroid disease    Past Surgical History:  Procedure Laterality Date  . BREAST SURGERY    . CHOLECYSTECTOMY    . REDUCTION MAMMAPLASTY    . TONSILLECTOMY       Current Meds  Medication Sig  . atorvastatin (LIPITOR) 20 MG tablet TAKE 1 TABLET BY MOUTH EVERY NIGHT FOR CHOLESTEROL  . azelastine (OPTIVAR) 0.05 % ophthalmic solution Place 1 drop into both eyes 2 (two) times daily.  Marland Kitchen glucose blood (FREESTYLE TEST STRIPS) test strip 1 each by Other route as needed for other. Take 1 strip twice a day  .  levothyroxine (SYNTHROID) 150 MCG tablet TAKE 2 TABLETS ON WED AND SATURDAY AND ONE TABLET ONCE A DAY THE OTHER DAYS (Patient taking differently: Take 150 mcg by mouth daily. )  . linaclotide (LINZESS) 72 MCG capsule Take 1 capsule (72 mcg total) by mouth daily before breakfast.  . spironolactone (ALDACTONE) 50 MG tablet Take 1 tablet (50 mg total) by mouth daily.  Marland Kitchen triamcinolone cream (KENALOG) 0.1 % TAKE 1 THIN LAYER TWICE A DAY AS NEEDED FOR 30 DAY(S)     Allergies:   Patient has no known allergies.   Social History   Tobacco  Use  . Smoking status: Former Research scientist (life sciences)  . Smokeless tobacco: Never Used  Substance Use Topics  . Alcohol use: Not Currently  . Drug use: Never     Family Hx: The patient's family history includes Asthma in her sister; Cancer in her maternal grandfather and paternal grandfather; Diabetes in her brother, mother, sister, sister, and sister; Heart disease in her father; Hypertension in her father and mother; Kidney disease in her brother; Kidney failure in her sister.  ROS:   Please see the history of present illness.     All other systems reviewed and are negative.   Prior CV studies:   The following studies were reviewed today:  EKG, prior office note, lab work  Labs/Other Tests and Data Reviewed:    EKG:  EKG reviewed as above personally and interpreted shows right bundle branch block left anterior fascicular block  Recent Labs: 01/11/2020: ALT 16; BUN 14; Creatinine, Ser 0.99; Hemoglobin 14.9; Platelets 244; Potassium 4.2; Sodium 143; TSH 0.317   Recent Lipid Panel Lab Results  Component Value Date/Time   CHOL 198 01/11/2020 11:05 AM   TRIG 78 01/11/2020 11:05 AM   HDL 62 01/11/2020 11:05 AM   CHOLHDL 3.2 01/11/2020 11:05 AM   LDLCALC 122 (H) 01/11/2020 11:05 AM    Wt Readings from Last 3 Encounters:  02/08/20 207 lb (93.9 kg)  01/11/20 206 lb (93.4 kg)  01/11/20 206 lb 3.2 oz (93.5 kg)     Objective:    Vital Signs:  Ht 5' 5.4" (1.661 m)   Wt 207 lb (93.9 kg)   BMI 34.03 kg/m    VITAL SIGNS:  reviewed GEN:  no acute distress EYES:  sclerae anicteric, EOMI - Extraocular Movements Intact RESPIRATORY:  normal respiratory effort, symmetric expansion SKIN:  no rash, lesions or ulcers. MUSCULOSKELETAL:  no obvious deformities. NEURO:  alert and oriented x 3, no obvious focal deficit PSYCH:  normal affect  ASSESSMENT & PLAN:    Abnormal EKG, right bundle branch block, left anterior fascicular block -I will go ahead and check an echocardiogram to ensure proper  structure and function of her heart.  Her sister did have aortic stenosis. -I explained to her her increased risk for pacemaker in the future.  Obviously if she has any warning signs as extremely low heart rate in the 30s for instance or syncope, extreme shortness of breath she knows to seek medical attention.  Preoperative cardiac evaluation prior to panniculectomy -Given her abnormal EKG findings including left anterior fascicular block, as well as her father's early family history of coronary artery disease with heart attack in his 53s, I will go ahead and perform a pharmacologic stress test to ensure she is of low risk to proceed with anesthesia.  COVID-19 Education: The signs and symptoms of COVID-19 were discussed with the patient and how to seek care for testing (follow up with PCP or  arrange E-visit).  The importance of social distancing was discussed today.  Time:   Today, I have spent 30 minutes with the patient with telehealth technology discussing the above problems.     Medication Adjustments/Labs and Tests Ordered: Current medicines are reviewed at length with the patient today.  Concerns regarding medicines are outlined above.   Tests Ordered: Orders Placed This Encounter  Procedures  . MYOCARDIAL PERFUSION IMAGING  . ECHOCARDIOGRAM COMPLETE    Medication Changes: No orders of the defined types were placed in this encounter.   Follow Up:  In Person prn  Signed, Candee Furbish, MD  02/08/2020 10:22 AM    [facility]

## 2020-02-08 NOTE — Patient Instructions (Signed)
Medication Instructions:  The current medical regimen is effective;  continue present plan and medications.  *If you need a refill on your cardiac medications before your next appointment, please call your pharmacy*  Testing/Procedures: Your physician has requested that you have an echocardiogram. Echocardiography is a painless test that uses sound waves to create images of your heart. It provides your doctor with information about the size and shape of your heart and how well your heart's chambers and valves are working. This procedure takes approximately one hour. There are no restrictions for this procedure.  Your physician has requested that you have a lexiscan myoview. For further information please visit [email or web address]. Please follow instruction sheet, as given.  Follow-Up: Further follow up will be based on the results of the above testing.  Thank you for choosing [facility]!!     Echocardiogram An echocardiogram is a procedure that uses painless sound waves (ultrasound) to produce an image of the heart. Images from an echocardiogram can provide important information about:  Signs of coronary artery disease (CAD).  Aneurysm detection. An aneurysm is a weak or damaged part of an artery wall that bulges out from the normal force of blood pumping through the body.  Heart size and shape. Changes in the size or shape of the heart can be associated with certain conditions, including heart failure, aneurysm, and CAD.  Heart muscle function.  Heart valve function.  Signs of a past heart attack.  Fluid buildup around the heart.  Thickening of the heart muscle.  A tumor or infectious growth around the heart valves. Tell a health care provider about:  Any allergies you have.  All medicines you are taking, including vitamins, herbs, eye drops, creams, and over-the-counter medicines.  Any blood disorders you have.  Any surgeries you have had.  Any medical  conditions you have.  Whether you are pregnant or may be pregnant. What are the risks? Generally, this is a safe procedure. However, problems may occur, including:  Allergic reaction to dye (contrast) that may be used during the procedure. What happens before the procedure? No specific preparation is needed. You may eat and drink normally. What happens during the procedure?   An IV tube may be inserted into one of your veins.  You may receive contrast through this tube. A contrast is an injection that improves the quality of the pictures from your heart.  A gel will be applied to your chest.  A wand-like tool (transducer) will be moved over your chest. The gel will help to transmit the sound waves from the transducer.  The sound waves will harmlessly bounce off of your heart to allow the heart images to be captured in real-time motion. The images will be recorded on a computer. The procedure may vary among health care providers and hospitals. What happens after the procedure?  You may return to your normal, everyday life, including diet, activities, and medicines, unless your health care provider tells you not to do that. Summary  An echocardiogram is a procedure that uses painless sound waves (ultrasound) to produce an image of the heart.  Images from an echocardiogram can provide important information about the size and shape of your heart, heart muscle function, heart valve function, and fluid buildup around your heart.  You do not need to do anything to prepare before this procedure. You may eat and drink normally.  After the echocardiogram is completed, you may return to your normal, everyday life, unless your health care provider  tells you not to do that. This information is not intended to replace advice given to you by your health care provider. Make sure you discuss any questions you have with your health care provider. Document Revised: 03/30/2019 Document Reviewed:  01/09/2017 Elsevier Patient Education  2020 [place].   Cardiac Nuclear Scan A cardiac nuclear scan is a test that is done to check the flow of blood to your heart. It is done when you are resting and when you are exercising. The test looks for problems such as:  Not enough blood reaching a portion of the heart.  The heart muscle not working as it should. You may need this test if:  You have heart disease.  You have had lab results that are not normal.  You have had heart surgery or a balloon procedure to open up blocked arteries (angioplasty).  You have chest pain.  You have shortness of breath. In this test, a special dye (tracer) is put into your bloodstream. The tracer will travel to your heart. A camera will then take pictures of your heart to see how the tracer moves through your heart. This test is usually done at a hospital and takes 2-4 hours. Tell a doctor about:  Any allergies you have.  All medicines you are taking, including vitamins, herbs, eye drops, creams, and over-the-counter medicines.  Any problems you or family members have had with anesthetic medicines.  Any blood disorders you have.  Any surgeries you have had.  Any medical conditions you have.  Whether you are pregnant or may be pregnant. What are the risks? Generally, this is a safe test. However, problems may occur, such as:  Serious chest pain and heart attack. This is only a risk if the stress portion of the test is done.  Rapid heartbeat.  A feeling of warmth in your chest. This feeling usually does not last long.  Allergic reaction to the tracer. What happens before the test?  Ask your doctor about changing or stopping your normal medicines. This is important.  Follow instructions from your doctor about what you cannot eat or drink.  Remove your jewelry on the day of the test. What happens during the test?  An IV tube will be inserted into one of your veins.  Your doctor  will give you a small amount of tracer through the IV tube.  You will wait for 20-40 minutes while the tracer moves through your bloodstream.  Your heart will be monitored with an electrocardiogram (ECG).  You will lie down on an exam table.  Pictures of your heart will be taken for about 15-20 minutes.  You may also have a stress test. For this test, one of these things may be done: ? You will be asked to exercise on a treadmill or a stationary bike. ? You will be given medicines that will make your heart work harder. This is done if you are unable to exercise.  When blood flow to your heart has peaked, a tracer will again be given through the IV tube.  After 20-40 minutes, you will get back on the exam table. More pictures will be taken of your heart.  Depending on the tracer that is used, more pictures may need to be taken 3-4 hours later.  Your IV tube will be removed when the test is over. The test may vary among doctors and hospitals. What happens after the test?  Ask your doctor: ? Whether you can return to your normal  schedule, including diet, activities, and medicines. ? Whether you should drink more fluids. This will help to remove the tracer from your body. Drink enough fluid to keep your pee (urine) pale yellow.  Ask your doctor, or the department that is doing the test: ? When will my results be ready? ? How will I get my results? Summary  A cardiac nuclear scan is a test that is done to check the flow of blood to your heart.  Tell your doctor whether you are pregnant or may be pregnant.  Before the test, ask your doctor about changing or stopping your normal medicines. This is important.  Ask your doctor whether you can return to your normal activities. You may be asked to drink more fluids. This information is not intended to replace advice given to you by your health care provider. Make sure you discuss any questions you have with your health care  provider. Document Revised: 03/29/2019 Document Reviewed: 05/23/2018 Elsevier Patient Education  [place].

## 2020-02-12 ENCOUNTER — Telehealth (HOSPITAL_COMMUNITY): Payer: Self-pay | Admitting: *Deleted

## 2020-02-12 DIAGNOSIS — R69 Illness, unspecified: Secondary | ICD-10-CM | POA: Diagnosis not present

## 2020-02-12 NOTE — Telephone Encounter (Signed)
Patient given detailed instructions per Myocardial Perfusion Study Information Sheet for the test on 02/14/20. Patient notified to arrive 15 minutes early and that it is imperative to arrive on time for appointment to keep from having the test rescheduled.  If you need to cancel or reschedule your appointment, please call the office within 24 hours of your appointment. . Patient verbalized understanding. Kirstie Peri

## 2020-02-14 ENCOUNTER — Ambulatory Visit (HOSPITAL_COMMUNITY): Payer: Medicare HMO | Attending: Internal Medicine

## 2020-02-14 ENCOUNTER — Other Ambulatory Visit: Payer: Self-pay

## 2020-02-14 DIAGNOSIS — I444 Left anterior fascicular block: Secondary | ICD-10-CM

## 2020-02-14 DIAGNOSIS — I452 Bifascicular block: Secondary | ICD-10-CM | POA: Diagnosis not present

## 2020-02-14 DIAGNOSIS — R9431 Abnormal electrocardiogram [ECG] [EKG]: Secondary | ICD-10-CM | POA: Insufficient documentation

## 2020-02-14 DIAGNOSIS — Z01810 Encounter for preprocedural cardiovascular examination: Secondary | ICD-10-CM | POA: Insufficient documentation

## 2020-02-14 DIAGNOSIS — Z8249 Family history of ischemic heart disease and other diseases of the circulatory system: Secondary | ICD-10-CM | POA: Diagnosis not present

## 2020-02-14 DIAGNOSIS — I4510 Unspecified right bundle-branch block: Secondary | ICD-10-CM | POA: Diagnosis not present

## 2020-02-14 LAB — MYOCARDIAL PERFUSION IMAGING
LV dias vol: 91 mL (ref 46–106)
LV sys vol: 33 mL
Peak HR: 94 {beats}/min
Rest HR: 57 {beats}/min
SDS: 0
SRS: 0
SSS: 0
TID: 1.08

## 2020-02-14 MED ORDER — TECHNETIUM TC 99M TETROFOSMIN IV KIT
10.6000 | PACK | Freq: Once | INTRAVENOUS | Status: AC | PRN
  Administered 2020-02-14: 08:00:00 10.6 via INTRAVENOUS
  Filled 2020-02-14: qty 11

## 2020-02-14 MED ORDER — REGADENOSON 0.4 MG/5ML IV SOLN
0.4000 mg | Freq: Once | INTRAVENOUS | Status: AC
  Administered 2020-02-14: 0.4 mg via INTRAVENOUS

## 2020-02-14 MED ORDER — TECHNETIUM TC 99M TETROFOSMIN IV KIT
32.6000 | PACK | Freq: Once | INTRAVENOUS | Status: AC | PRN
  Administered 2020-02-14: 10:00:00 32.6 via INTRAVENOUS
  Filled 2020-02-14: qty 33

## 2020-02-16 ENCOUNTER — Ambulatory Visit: Payer: Medicare HMO | Attending: Internal Medicine

## 2020-02-16 DIAGNOSIS — Z23 Encounter for immunization: Secondary | ICD-10-CM | POA: Insufficient documentation

## 2020-02-16 NOTE — Progress Notes (Signed)
   Covid-19 Vaccination Clinic  Name:  Nancy [name]    MRN: [id number] DOB: 06-16-1951  02/16/2020  [name] was observed post Covid-19 immunization for 15 minutes without incidence. She was provided with Vaccine Information Sheet and instruction to access the V-Safe system.   [name] was instructed to call 911 with any severe reactions post vaccine: Marland Kitchen Difficulty breathing  . Swelling of your face and throat  . A fast heartbeat  . A bad rash all over your body  . Dizziness and weakness    Immunizations Administered    Name Date Dose VIS Date Route   Pfizer COVID-19 Vaccine 02/16/2020  1:13 PM 0.3 mL 12/01/2019 Intramuscular   Manufacturer: [place]   Lot: [id number]   [place]: [id number]

## 2020-02-22 ENCOUNTER — Ambulatory Visit (HOSPITAL_COMMUNITY): Payer: Medicare HMO | Attending: Cardiology

## 2020-02-22 ENCOUNTER — Other Ambulatory Visit: Payer: Self-pay

## 2020-02-22 DIAGNOSIS — Z01810 Encounter for preprocedural cardiovascular examination: Secondary | ICD-10-CM | POA: Insufficient documentation

## 2020-02-22 DIAGNOSIS — R9431 Abnormal electrocardiogram [ECG] [EKG]: Secondary | ICD-10-CM | POA: Insufficient documentation

## 2020-02-26 ENCOUNTER — Other Ambulatory Visit: Payer: Self-pay | Admitting: *Deleted

## 2020-02-26 DIAGNOSIS — I359 Nonrheumatic aortic valve disorder, unspecified: Secondary | ICD-10-CM

## 2020-03-10 ENCOUNTER — Other Ambulatory Visit: Payer: Self-pay | Admitting: Nurse Practitioner

## 2020-03-10 DIAGNOSIS — I10 Essential (primary) hypertension: Secondary | ICD-10-CM

## 2020-03-13 ENCOUNTER — Ambulatory Visit: Payer: Medicare HMO | Attending: Internal Medicine

## 2020-03-13 DIAGNOSIS — Z23 Encounter for immunization: Secondary | ICD-10-CM

## 2020-03-13 NOTE — Progress Notes (Signed)
   Covid-19 Vaccination Clinic  Name:  Nancy [name]    MRN: [id number] DOB: 10-17-1951  03/13/2020  [name] was observed post Covid-19 immunization for 15 minutes without incident. She was provided with Vaccine Information Sheet and instruction to access the V-Safe system.   [name] was instructed to call 911 with any severe reactions post vaccine: Marland Kitchen Difficulty breathing  . Swelling of face and throat  . A fast heartbeat  . A bad rash all over body  . Dizziness and weakness   Immunizations Administered    Name Date Dose VIS Date Route   Pfizer COVID-19 Vaccine 03/13/2020 10:37 AM 0.3 mL 12/01/2019 Intramuscular   Manufacturer: [place]   Lot: [id number]   [place]: [id number]

## 2020-03-14 ENCOUNTER — Telehealth: Payer: Self-pay | Admitting: Cardiology

## 2020-03-14 NOTE — Telephone Encounter (Signed)
   [facility] Pre-operative Risk Assessment    Request for surgical clearance:  1. What type of surgery is being performed? PANICULOPLASTY, HERNIA REPAIR, LIPOSUCTION  2. When is this surgery scheduled? TBD   3. What type of clearance is required (medical clearance vs. Pharmacy clearance to hold med vs. Both)? MEDICAL  4. Are there any medications that need to be held prior to surgery and how long? NO MEDS NEED TO BE HELD PER SURGEON   5. Practice name and name of physician performing surgery? [facility]; DR. Towanda Malkin   6. What is your office phone number [phone]    7.   What is your office fax number [phone]; ATTN : [id number]   8.   Anesthesia type (None, local, MAC, general) ? CONSCIOUS SEDATION   Julaine Hua 03/14/2020, 12:27 PM  _________________________________________________________________   (provider comments below)

## 2020-03-14 NOTE — Telephone Encounter (Signed)
Patient wants to know if she has been cleared for her surgery.

## 2020-03-14 NOTE — Telephone Encounter (Signed)
I will send call to our Pre Op Team to please review Dr. Evon Slack note for pre op clearance.

## 2020-03-14 NOTE — Telephone Encounter (Signed)
   Primary Cardiologist: Candee Furbish, MD  Chart reviewed as part of pre-operative protocol coverage. Nancy [name] was seen in our office on 02/08/2020 for initial cardiac evaluation by Dr. Marlou Porch. She had a low risk myocardial perfusion study on 01/25/20. An echocardiogram on 02/22/20 showed normal LV systolic function with EF [id number], grade 1 diastolic dysfunction. She has mild-mod aortic regurg which will be followed over time. This is not a contraindication for surgery.   According to the [place] is a class I risk with 0.4% risk of major cardiac event perioperatively which is low risk.   Given past medical history and time since last visit, based on ACC/AHA guidelines, Nancy [name] would be at acceptable risk for the planned procedure without further cardiovascular testing.   I will route this recommendation to the requesting party via Epic fax function and remove from pre-op pool.  Please call with questions.  Daune Perch, NP 03/14/2020, 12:33 PM

## 2020-03-26 ENCOUNTER — Other Ambulatory Visit: Payer: Self-pay | Admitting: Nurse Practitioner

## 2020-03-26 DIAGNOSIS — E039 Hypothyroidism, unspecified: Secondary | ICD-10-CM

## 2020-03-28 ENCOUNTER — Encounter: Payer: Self-pay | Admitting: Nurse Practitioner

## 2020-04-10 ENCOUNTER — Other Ambulatory Visit: Payer: Self-pay

## 2020-04-10 ENCOUNTER — Encounter: Payer: Self-pay | Admitting: Nurse Practitioner

## 2020-04-10 ENCOUNTER — Ambulatory Visit (INDEPENDENT_AMBULATORY_CARE_PROVIDER_SITE_OTHER): Payer: Medicare HMO | Admitting: Nurse Practitioner

## 2020-04-10 VITALS — BP 116/80 | HR 58 | Temp 97.6°F | Ht 65.0 in | Wt 209.4 lb

## 2020-04-10 DIAGNOSIS — R7303 Prediabetes: Secondary | ICD-10-CM

## 2020-04-10 DIAGNOSIS — K5900 Constipation, unspecified: Secondary | ICD-10-CM | POA: Diagnosis not present

## 2020-04-10 DIAGNOSIS — E039 Hypothyroidism, unspecified: Secondary | ICD-10-CM

## 2020-04-10 DIAGNOSIS — M79671 Pain in right foot: Secondary | ICD-10-CM | POA: Diagnosis not present

## 2020-04-10 DIAGNOSIS — M79672 Pain in left foot: Secondary | ICD-10-CM | POA: Diagnosis not present

## 2020-04-10 MED ORDER — BLOOD GLUCOSE MONITOR KIT: PACK | 0 refills | Status: DC

## 2020-04-10 MED ORDER — LEVOTHYROXINE SODIUM 150 MCG PO TABS: ORAL_TABLET | ORAL | 1 refills | Status: DC

## 2020-04-10 NOTE — Progress Notes (Addendum)
This visit occurred during the SARS-CoV-2 public health emergency.  Safety protocols were in place, including screening questions prior to the visit, additional usage of staff PPE, and extensive cleaning of exam room while observing appropriate contact time as indicated for disinfecting solutions.  Subjective:     Patient ID: Nancy [name] , female    DOB: 1951-10-10 , 69 y.o.   MRN: [id number]   Chief Complaint  Patient presents with  . Hypertension  . Foot Pain    HPI  She is awaiting cardiac clearance from Dr. Marlou Porch for her liposuction.   Hypertension This is a chronic problem. The current episode started more than 1 year ago. The problem is unchanged. The problem is controlled. Pertinent negatives include no anxiety, chest pain, headaches, palpitations or peripheral edema. There are no associated agents to hypertension. Risk factors for coronary artery disease include obesity and sedentary lifestyle. Past treatments include diuretics. There are no compliance problems.  There is no history of angina. There is no history of chronic renal disease.  Constipation This is a chronic problem. The current episode started more than 1 year ago. Her stool frequency is 2 to 3 times per week. The stool is described as firm. The patient is on a high fiber diet. She exercises regularly. There has been adequate water intake. Pertinent negatives include no abdominal pain, bloating or nausea. Treatments tried: Unable to get Linzess due to cost - $400 - having a bowel movement every 2 days. The treatment provided moderate relief. There is no history of abdominal surgery. (Per patient history of twisted colon)    Past Medical History:  Diagnosis Date  . Hyperlipidemia   . Hypertension   . Prediabetes   . Thyroid disease      Family History  Problem Relation Age of Onset  . Diabetes Mother   . Hypertension Mother   . Hypertension Father   . Heart disease Father   . Diabetes Sister   . Kidney  failure Sister   . Diabetes Brother   . Kidney disease Brother   . Diabetes Sister   . Diabetes Sister   . Asthma Sister   . Cancer Maternal Grandfather   . Cancer Paternal Grandfather      Current Outpatient Medications:  .  atorvastatin (LIPITOR) 20 MG tablet, TAKE 1 TABLET BY MOUTH EVERY NIGHT FOR CHOLESTEROL, Disp: 90 tablet, Rfl: 0 .  azelastine (OPTIVAR) 0.05 % ophthalmic solution, Place 1 drop into both eyes 2 (two) times daily., Disp: , Rfl:  .  glucose blood (FREESTYLE TEST STRIPS) test strip, 1 each by Other route as needed for other. Take 1 strip twice a day, Disp: , Rfl:  .  levothyroxine (SYNTHROID) 150 MCG tablet, 1 tablet by mouth on Mon, Tue, Th, Fri and Sun and 1/2 tablet on Wednesday and saturday, Disp: 90 tablet, Rfl: 1 .  spironolactone (ALDACTONE) 50 MG tablet, TAKE 1 TABLET BY MOUTH EVERY DAY, Disp: 90 tablet, Rfl: 1 .  triamcinolone cream (KENALOG) 0.1 %, TAKE 1 THIN LAYER TWICE A DAY AS NEEDED FOR 30 DAY(S), Disp: 60 g, Rfl: 0 .  blood glucose meter kit and supplies KIT, Dispense based on patient and insurance preference. Use up to four times daily as directed. (FOR ICD-9 250.00, 250.01)., Disp: 1 each, Rfl: 0 .  linaclotide (LINZESS) 72 MCG capsule, Take 1 capsule (72 mcg total) by mouth daily before breakfast. (Patient not taking: Reported on 04/10/2020), Disp: 90 capsule, Rfl: 1   No Known Allergies  Review of Systems  Constitutional: Negative.   HENT: Negative.   Eyes: Negative.   Respiratory: Negative.   Cardiovascular: Negative.  Negative for chest pain, palpitations and leg swelling.  Gastrointestinal: Positive for constipation. Negative for abdominal pain, bloating and nausea.  Endocrine: Negative.   Genitourinary: Negative.   Musculoskeletal: Negative.   Skin: Negative.   Neurological: Negative for dizziness and headaches.  Psychiatric/Behavioral: Negative.      Today's Vitals   04/10/20 1038  BP: 116/80  Pulse: (!) 58  Temp: 97.6 F (36.4  C)  TempSrc: Oral  Weight: 209 lb 6.4 oz (95 kg)  Height: '5\' 5"'$  (1.651 m)  PainSc: 3   PainLoc: Foot   Body mass index is 34.85 kg/m.   Objective:  Physical Exam Constitutional:      General: She is not in acute distress.    Appearance: Normal appearance. She is obese.  HENT:     Head: Normocephalic and atraumatic.  Eyes:     Extraocular Movements: Extraocular movements intact.     Conjunctiva/sclera: Conjunctivae normal.     Pupils: Pupils are equal, round, and reactive to light.  Cardiovascular:     Rate and Rhythm: Normal rate and regular rhythm.     Pulses: Normal pulses.     Heart sounds: Normal heart sounds. No murmur.  Pulmonary:     Effort: Pulmonary effort is normal. No respiratory distress.     Breath sounds: Normal breath sounds.  Abdominal:     General: Bowel sounds are normal. There is no distension.     Palpations: Abdomen is soft.     Tenderness: There is no abdominal tenderness.  Musculoskeletal:        General: No tenderness. Normal range of motion.     Cervical back: Normal range of motion and neck supple. No tenderness.  Skin:    General: Skin is warm and dry.     Capillary Refill: Capillary refill takes less than 2 seconds.  Neurological:     General: No focal deficit present.     Mental Status: She is alert and oriented to person, place, and time.  Psychiatric:        Mood and Affect: Mood normal.        Behavior: Behavior normal.        Thought Content: Thought content normal.        Judgment: Judgment normal.         Assessment And Plan:     1. Prediabetes  Chronic, controlled  Continue with current medications  Encouraged to limit intake of sugary foods and drinks  Encouraged to increase physical activity to 150 minutes per week as tolerated - blood glucose meter kit and supplies KIT; Dispense based on patient and insurance preference. Use up to four times daily as directed. (FOR ICD-9 250.00, 250.01).  Dispense: 1 each; Refill:  0 - Hemoglobin A1c - TSH - T4 - T3, free  2. Acquired hypothyroidism  Chronic  Will check thyroid levels - levothyroxine (SYNTHROID) 150 MCG tablet; 1 tablet by mouth on Mon, Tue, Th, Fri and Sun and 1/2 tablet on Wednesday and saturday  Dispense: 90 tablet; Refill: 1 - CMP14+EGFR - TSH - T4 - T3, free  3. Bilateral foot pain  She is having foot pain to the ball of both feet left worse than right  Will refer to podiatrist - Ambulatory referral to Podiatry   4. Constipation, unspecified constipation type  Improved, now having regular bowel movements  Eveny Anastas, FNP    THE PATIENT IS ENCOURAGED TO PRACTICE SOCIAL DISTANCING DUE TO THE COVID-19 PANDEMIC.   

## 2020-04-10 NOTE — Patient Instructions (Signed)
Chronic Kidney Disease, Adult Chronic kidney disease (CKD) happens when the kidneys are damaged over a long period of time. The kidneys are two organs that help with:  Getting rid of waste and extra fluid from the blood.  Making hormones that maintain the amount of fluid in your tissues and blood vessels.  Making sure that the body has the right amount of fluids and chemicals. Most of the time, CKD does not go away, but it can usually be controlled. Steps must be taken to slow down the kidney damage or to stop it from getting worse. If this is not done, the kidneys may stop working. Follow these instructions at home: Medicines  Take over-the-counter and prescription medicines only as told by your doctor. You may need to change the amount of medicines you take.  Do not take any new medicines unless your doctor says it is okay. Many medicines can make your kidney damage worse.  Do not take any vitamin and supplements unless your doctor says it is okay. Many vitamins and supplements can make your kidney damage worse. General instructions  Follow a diet as told by your doctor. You may need to stay away from: ? Alcohol. ? Salty foods. ? Foods that are high in:  Potassium.  Calcium.  Protein.  Do not use any products that contain nicotine or tobacco, such as cigarettes and e-cigarettes. If you need help quitting, ask your doctor.  Keep track of your blood pressure at home. Tell your doctor about any changes.  If you have diabetes, keep track of your blood sugar as told by your doctor.  Try to stay at a healthy weight. If you need help, ask your doctor.  Exercise at least 30 minutes a day, 5 days a week.  Stay up-to-date with your shots (immunizations) as told by your doctor.  Keep all follow-up visits as told by your doctor. This is important. Contact a doctor if:  Your symptoms get worse.  You have new symptoms. Get help right away if:  You have symptoms of end-stage  kidney disease. These may include: ? Headaches. ? Numbness in your hands or feet. ? Easy bruising. ? Having hiccups often. ? Chest pain. ? Shortness of breath. ? Stopping of menstrual periods in women.  You have a fever.  You have very little pee (urine).  You have pain or bleeding when you pee. Summary  Chronic kidney disease (CKD) happens when the kidneys are damaged over a long period of time.  Most of the time, this condition does not go away, but it can usually be controlled. Steps must be taken to slow down the kidney damage or to stop it from getting worse.  Treatment may include a combination of medicines and lifestyle changes. This information is not intended to replace advice given to you by your health care provider. Make sure you discuss any questions you have with your health care provider. Document Revised: 11/19/2017 Document Reviewed: 01/11/2017 Elsevier Patient Education  2020 [employer]  

## 2020-04-11 ENCOUNTER — Telehealth: Payer: Self-pay | Admitting: Cardiology

## 2020-04-11 LAB — CMP14+EGFR
ALT: 40 IU/L — ABNORMAL HIGH (ref 0–32)
AST: 46 IU/L — ABNORMAL HIGH (ref 0–40)
Albumin/Globulin Ratio: 1.3 (ref 1.2–2.2)
Albumin: 4.6 g/dL (ref 3.8–4.8)
Alkaline Phosphatase: 172 IU/L — ABNORMAL HIGH (ref 39–117)
BUN/Creatinine Ratio: 23 (ref 12–28)
BUN: 24 mg/dL (ref 8–27)
Bilirubin Total: 0.4 mg/dL (ref 0.0–1.2)
CO2: 25 mmol/L (ref 20–29)
Calcium: 10.2 mg/dL (ref 8.7–10.3)
Chloride: 105 mmol/L (ref 96–106)
Creatinine, Ser: 1.06 mg/dL — ABNORMAL HIGH (ref 0.57–1.00)
GFR calc Af Amer: 62 mL/min/{1.73_m2} (ref >59)
GFR calc non Af Amer: 54 mL/min/{1.73_m2} — ABNORMAL LOW (ref >59)
Globulin, Total: 3.5 g/dL (ref 1.5–4.5)
Glucose: 79 mg/dL (ref 65–99)
Potassium: 5 mmol/L (ref 3.5–5.2)
Sodium: 143 mmol/L (ref 134–144)
Total Protein: 8.1 g/dL (ref 6.0–8.5)

## 2020-04-11 LAB — T3, FREE: T3, Free: 1.9 pg/mL — ABNORMAL LOW (ref 2.0–4.4)

## 2020-04-11 LAB — HEMOGLOBIN A1C
Est. average glucose Bld gHb Est-mCnc: 117 mg/dL
Hgb A1c MFr Bld: 5.7 % — ABNORMAL HIGH (ref 4.8–5.6)

## 2020-04-11 LAB — TSH: TSH: 6.61 u[IU]/mL — ABNORMAL HIGH (ref 0.450–4.500)

## 2020-04-11 LAB — T4: T4, Total: 9.4 ug/dL (ref 4.5–12.0)

## 2020-04-11 NOTE — Telephone Encounter (Signed)
Pt asking if surgical clearance has been completed and sent to surgeon.  Pt advised clearance was completed and faxed on 03/14/2020 by Daune Perch, NP.  Pt states she saw her PCP yesterday 04/10/2020 and was advised her HR was 58.  PCP recommended she contact Dr Marlou Porch and notify of lower heart rate d/t upcoming surgery. 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

## 2020-04-11 NOTE — Telephone Encounter (Signed)
New Message  Patient called and stated that she needs a hard copy of release to have surgery for her and for the surgeon. Also stated that Dr. Marlou Porch told her that if her heart rate drops below 60, its a indication of something wrong and her is 58.  Please call and discuss

## 2020-04-12 NOTE — Telephone Encounter (Signed)
Spoke with pt and advised per Dr Marlou Porch OK to proceed with surgery.  Pt verbalizes understanding and thanked Therapist, sports for call.  Pt states she is "so pleased with the staff at [facility] and it was an absolute pleasure to be here."  Pt asked that RN pass this along.

## 2020-04-12 NOTE — Telephone Encounter (Signed)
Please see prior notes. I have addressed. OK to proceed with surgery Candee Furbish, MD

## 2020-04-18 ENCOUNTER — Other Ambulatory Visit: Payer: Self-pay | Admitting: Nurse Practitioner

## 2020-04-19 ENCOUNTER — Encounter: Payer: Self-pay | Admitting: Nurse Practitioner

## 2020-05-01 ENCOUNTER — Other Ambulatory Visit: Payer: Self-pay

## 2020-05-01 DIAGNOSIS — R7303 Prediabetes: Secondary | ICD-10-CM

## 2020-05-01 MED ORDER — BLOOD GLUCOSE MONITOR KIT: PACK | 0 refills | Status: DC

## 2020-05-02 ENCOUNTER — Other Ambulatory Visit: Payer: Self-pay | Admitting: Nurse Practitioner

## 2020-05-02 ENCOUNTER — Other Ambulatory Visit: Payer: Self-pay

## 2020-05-02 DIAGNOSIS — R7303 Prediabetes: Secondary | ICD-10-CM

## 2020-05-02 DIAGNOSIS — R69 Illness, unspecified: Secondary | ICD-10-CM | POA: Diagnosis not present

## 2020-05-02 MED ORDER — ACCU-CHEK GUIDE W/DEVICE KIT: 1.0000 | PACK | Freq: Every day | 0 refills | Status: DC

## 2020-05-02 MED ORDER — ACCU-CHEK GUIDE VI STRP: ORAL_STRIP | 12 refills | Status: DC

## 2020-05-03 DIAGNOSIS — R69 Illness, unspecified: Secondary | ICD-10-CM | POA: Diagnosis not present

## 2020-05-14 ENCOUNTER — Ambulatory Visit (INDEPENDENT_AMBULATORY_CARE_PROVIDER_SITE_OTHER): Payer: Medicare HMO | Admitting: Sports Medicine

## 2020-05-14 ENCOUNTER — Other Ambulatory Visit: Payer: Self-pay | Admitting: Sports Medicine

## 2020-05-14 ENCOUNTER — Other Ambulatory Visit: Payer: Self-pay

## 2020-05-14 ENCOUNTER — Encounter: Payer: Self-pay | Admitting: Sports Medicine

## 2020-05-14 ENCOUNTER — Ambulatory Visit (INDEPENDENT_AMBULATORY_CARE_PROVIDER_SITE_OTHER): Payer: Medicare HMO

## 2020-05-14 VITALS — BP 104/58 | HR 57 | Temp 97.4°F | Resp 16

## 2020-05-14 DIAGNOSIS — M79673 Pain in unspecified foot: Secondary | ICD-10-CM

## 2020-05-14 DIAGNOSIS — M722 Plantar fascial fibromatosis: Secondary | ICD-10-CM

## 2020-05-14 DIAGNOSIS — M2142 Flat foot [pes planus] (acquired), left foot: Secondary | ICD-10-CM

## 2020-05-14 DIAGNOSIS — M79672 Pain in left foot: Secondary | ICD-10-CM

## 2020-05-14 DIAGNOSIS — M79671 Pain in right foot: Secondary | ICD-10-CM

## 2020-05-14 DIAGNOSIS — D229 Melanocytic nevi, unspecified: Secondary | ICD-10-CM | POA: Diagnosis not present

## 2020-05-14 DIAGNOSIS — M79674 Pain in right toe(s): Secondary | ICD-10-CM | POA: Diagnosis not present

## 2020-05-14 DIAGNOSIS — M2141 Flat foot [pes planus] (acquired), right foot: Secondary | ICD-10-CM

## 2020-05-14 DIAGNOSIS — L603 Nail dystrophy: Secondary | ICD-10-CM

## 2020-05-14 NOTE — Patient Instructions (Addendum)
For tennis shoes recommend:  Kandy Garrison Ascis New balance [place] Can be purchased at [employer] or [employer] arch fit Can be purchased at any major retailers  Vionic  SAS Can be purchased at [employer] or [employer]   For work shoes recommend: Runner, broadcasting/film/video Work [employer] Can be purchased at a variety of places or Engineer, maintenance (IT)   For casual shoes recommend: Vionic  Can be purchased at [employer] or [employer]   For Over the [employer] recommend: Power Steps Can be purchased in office/[facility] [employer] Can be purchased at [employer] or [employer] Can be purchased at [employer]   Plantar Fasciitis (Heel Spur Syndrome) with Rehab The plantar fascia is a fibrous, ligament-like, soft-tissue structure that spans the bottom of the foot. Plantar fasciitis is a condition that causes pain in the foot due to inflammation of the tissue. SYMPTOMS   Pain and tenderness on the underneath side of the foot.  Pain that worsens with standing or walking. CAUSES  Plantar fasciitis is caused by irritation and injury to the plantar fascia on the underneath side of the foot. Common mechanisms of injury include:  Direct trauma to bottom of the foot.  Damage to a small nerve that runs under the foot where the main fascia attaches to the heel bone.  Stress placed on the plantar fascia due to bone spurs. RISK INCREASES WITH:   Activities that place stress on the plantar fascia (running, jumping, pivoting, or cutting).  Poor strength and flexibility.  Improperly fitted shoes.  Tight calf muscles.  Flat feet.  Failure to warm-up properly before activity.  Obesity. PREVENTION  Warm up and stretch properly before activity.  Allow for adequate recovery between workouts.  Maintain physical fitness:  Strength, flexibility, and endurance.  Cardiovascular fitness.  Maintain a health body weight.  Avoid stress on the plantar  fascia.  Wear properly fitted shoes, including arch supports for individuals who have flat feet.  PROGNOSIS  If treated properly, then the symptoms of plantar fasciitis usually resolve without surgery. However, occasionally surgery is necessary.  RELATED COMPLICATIONS   Recurrent symptoms that may result in a chronic condition.  Problems of the lower back that are caused by compensating for the injury, such as limping.  Pain or weakness of the foot during push-off following surgery.  Chronic inflammation, scarring, and partial or complete fascia tear, occurring more often from repeated injections.  TREATMENT  Treatment initially involves the use of ice and medication to help reduce pain and inflammation. The use of strengthening and stretching exercises may help reduce pain with activity, especially stretches of the Achilles tendon. These exercises may be performed at home or with a therapist. Your caregiver may recommend that you use heel cups of arch supports to help reduce stress on the plantar fascia. Occasionally, corticosteroid injections are given to reduce inflammation. If symptoms persist for greater than 6 months despite non-surgical (conservative), then surgery may be recommended.   MEDICATION   If pain medication is necessary, then nonsteroidal anti-inflammatory medications, such as aspirin and ibuprofen, or other minor pain relievers, such as acetaminophen, are often recommended.  Do not take pain medication within 7 days before surgery.  Prescription pain relievers may be given if deemed necessary by your caregiver. Use only as directed and only as much as you need.  Corticosteroid injections may be given by your caregiver. These injections should be reserved for the most serious cases, because they may only be  given a certain number of times.  HEAT AND COLD  Cold treatment (icing) relieves pain and reduces inflammation. Cold treatment should be applied for 10 to 15  minutes every 2 to 3 hours for inflammation and pain and immediately after any activity that aggravates your symptoms. Use ice packs or massage the area with a piece of ice (ice massage).  Heat treatment may be used prior to performing the stretching and strengthening activities prescribed by your caregiver, physical therapist, or athletic trainer. Use a heat pack or soak the injury in warm water.  SEEK IMMEDIATE MEDICAL CARE IF:  Treatment seems to offer no benefit, or the condition worsens.  Any medications produce adverse side effects.  EXERCISES- RANGE OF MOTION (ROM) AND STRETCHING EXERCISES - Plantar Fasciitis (Heel Spur Syndrome) These exercises may help you when beginning to rehabilitate your injury. Your symptoms may resolve with or without further involvement from your physician, physical therapist or athletic trainer. While completing these exercises, remember:   Restoring tissue flexibility helps normal motion to return to the joints. This allows healthier, less painful movement and activity.  An effective stretch should be held for at least 30 seconds.  A stretch should never be painful. You should only feel a gentle lengthening or release in the stretched tissue.  RANGE OF MOTION - Toe Extension, Flexion  Sit with your right / left leg crossed over your opposite knee.  Grasp your toes and gently pull them back toward the top of your foot. You should feel a stretch on the bottom of your toes and/or foot.  Hold this stretch for 10 seconds.  Now, gently pull your toes toward the bottom of your foot. You should feel a stretch on the top of your toes and or foot.  Hold this stretch for 10 seconds. Repeat  times. Complete this stretch 3 times per day.   RANGE OF MOTION - Ankle Dorsiflexion, Active Assisted  Remove shoes and sit on a chair that is preferably not on a carpeted surface.  Place right / left foot under knee. Extend your opposite leg for support.  Keeping  your heel down, slide your right / left foot back toward the chair until you feel a stretch at your ankle or calf. If you do not feel a stretch, slide your bottom forward to the edge of the chair, while still keeping your heel down.  Hold this stretch for 10 seconds. Repeat 3 times. Complete this stretch 2 times per day.   STRETCH  Gastroc, Standing  Place hands on wall.  Extend right / left leg, keeping the front knee somewhat bent.  Slightly point your toes inward on your back foot.  Keeping your right / left heel on the floor and your knee straight, shift your weight toward the wall, not allowing your back to arch.  You should feel a gentle stretch in the right / left calf. Hold this position for 10 seconds. Repeat 3 times. Complete this stretch 2 times per day.  STRETCH  Soleus, Standing  Place hands on wall.  Extend right / left leg, keeping the other knee somewhat bent.  Slightly point your toes inward on your back foot.  Keep your right / left heel on the floor, bend your back knee, and slightly shift your weight over the back leg so that you feel a gentle stretch deep in your back calf.  Hold this position for 10 seconds. Repeat 3 times. Complete this stretch 2 times per day.  STRETCH  Gastrocsoleus, Standing  Note: This exercise can place a lot of stress on your foot and ankle. Please complete this exercise only if specifically instructed by your caregiver.   Place the ball of your right / left foot on a step, keeping your other foot firmly on the same step.  Hold on to the wall or a rail for balance.  Slowly lift your other foot, allowing your body weight to press your heel down over the edge of the step.  You should feel a stretch in your right / left calf.  Hold this position for 10 seconds.  Repeat this exercise with a slight bend in your right / left knee. Repeat 3 times. Complete this stretch 2 times per day.   STRENGTHENING EXERCISES - Plantar Fasciitis  (Heel Spur Syndrome)  These exercises may help you when beginning to rehabilitate your injury. They may resolve your symptoms with or without further involvement from your physician, physical therapist or athletic trainer. While completing these exercises, remember:   Muscles can gain both the endurance and the strength needed for everyday activities through controlled exercises.  Complete these exercises as instructed by your physician, physical therapist or athletic trainer. Progress the resistance and repetitions only as guided.  STRENGTH - Towel Curls  Sit in a chair positioned on a non-carpeted surface.  Place your foot on a towel, keeping your heel on the floor.  Pull the towel toward your heel by only curling your toes. Keep your heel on the floor. Repeat 3 times. Complete this exercise 2 times per day.  STRENGTH - Ankle Inversion  Secure one end of a rubber exercise band/tubing to a fixed object (table, pole). Loop the other end around your foot just before your toes.  Place your fists between your knees. This will focus your strengthening at your ankle.  Slowly, pull your big toe up and in, making sure the band/tubing is positioned to resist the entire motion.  Hold this position for 10 seconds.  Have your muscles resist the band/tubing as it slowly pulls your foot back to the starting position. Repeat 3 times. Complete this exercises 2 times per day.  Document Released: 12/07/2005 Document Revised: 02/29/2012 Document Reviewed: 03/21/2009 [facility] Patient Information 2014 [place], [place].

## 2020-05-14 NOTE — Progress Notes (Signed)
Subjective: Nancy [name] is a 69 y.o. female patient presents to office with complaint of arch pain only when she is working out reports that she has increased her mileage over the last month to walking 5 miles on the treadmill and every time she tries to walk 5 miles things of having pain in both her arches.  Patient reports that she has tried changing shoes and used to try to wear insoles but stopped wearing them because her feet started to get hot in her shoes.  Patient also reports that she has issue at her second toenails for years being very thick and also has an issue with some callus skin at her right third toe as well as a dark skin lesion noted to the bottom of the left foot that does not hurt.  Review of Systems  All other systems reviewed and are negative.   Patient Active Problem List   Diagnosis Date Noted  . Right bundle branch block 02/08/2020  . 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 Bifascicular block 02/08/2020  . Family history of early CAD 02/08/2020  . Prediabetes 09/25/2019  . Dry eyes 09/25/2019  . Mixed hyperlipidemia 09/25/2019  . Essential hypertension 09/25/2019    Current Outpatient Medications on File Prior to Visit  Medication Sig Dispense Refill  . atorvastatin (LIPITOR) 20 MG tablet TAKE 1 TABLET BY MOUTH EVERY NIGHT FOR CHOLESTEROL 90 tablet 0  . azelastine (OPTIVAR) 0.05 % ophthalmic solution Place 1 drop into both eyes 2 (two) times daily.    . Blood Glucose Monitoring Suppl (ACCU-CHEK GUIDE) w/Device KIT 1 Device by Does not apply route daily. 1 kit 0  . glucose blood (ACCU-CHEK GUIDE) test strip Use as instructed 100 each 12  . levothyroxine (SYNTHROID) 150 MCG tablet 1 tablet by mouth on Mon, Tue, Th, Fri and Sun and 1/2 tablet on Wednesday and saturday 90 tablet 1  . linaclotide (LINZESS) 72 MCG capsule Take 1 capsule (72 mcg total) by mouth daily before breakfast. 90 capsule 1  . spironolactone (ALDACTONE) 50 MG tablet TAKE 1 TABLET BY MOUTH  EVERY DAY 90 tablet 1  . triamcinolone cream (KENALOG) 0.1 % TAKE 1 THIN LAYER TWICE A DAY AS NEEDED FOR 30 DAY(S) 60 g 0   No current facility-administered medications on file prior to visit.    No Known Allergies  Objective: Physical Exam General: The patient is alert and oriented x3 in no acute distress.  Dermatology: Skin is warm, dry and supple bilateral lower extremities. Nails 1-10 are normal except bilateral second toes where there is some increased thickness of the nail likely mechanical secondary to long hammer second toes. There is no erythema, edema, no eccymosis, no open lesions present.  There is also mild reactive callus first toes bilateral as well as the medial nail fold on the right third toe likely from her toes rubbing.  There is a pigmented circular lesion that measures 0.4 x 0.5 cm that is flat circle shape and even in color likely consistent with nevus however at this time we will closely monitor.  Integument is otherwise unremarkable.  Vascular: Dorsalis Pedis pulse and Posterior Tibial pulse are 1/4 bilateral. Capillary fill time is immediate to all digits.  Neurological: Grossly intact to light touch bilateral.  Musculoskeletal: There is minimal reproducible tenderness to the arches bilateral no pain with compression of calcaneus bilateral.  Pain is worse in arches with exercise. There is decreased Ankle joint range of motion bilateral. All other joints range of  motion within normal limits bilateral except bilateral first metatarsophalangeal joints limited range of motion left greater than right consistent with arthritis with significant dorsal bunion deformity left greater than right as well as hammertoe deformity. Strength 5/5 in all groups bilateral.    Xray, Right/Left foot:  Normal osseous mineralization. Joint spaces preserved except at first metatarsophalangeal joint spur there is bunion deformity and limited range of motion and joint space narrowing left  greater than right first toe joint.  Midtarsal breech supportive of pes planus deformity.  Hammertoe deformities noted.  No fracture/dislocation/boney destruction. Calcaneal spur present with mild thickening of plantar fascia. No other soft tissue abnormalities or radiopaque foreign bodies.   Assessment and Plan: Problem List Items Addressed This Visit    None    Visit Diagnoses    Pain in both feet    -  Primary   Relevant Orders   DG Foot Complete Left (Completed)   Foot arch pain, unspecified laterality       Plantar fasciitis, bilateral       Pes planus of both feet       Nevus       Nail dystrophy       Toe pain, right          -Complete examination performed.  -Xrays reviewed -Discussed with patient in detail above diagnoses -Recommend to monitor nevus on the bottom of the left foot if changes may warrant biopsy -Advised patient to try Vicks VapoRub to thickened toenails every night after bath or shower and to use nail file to help the medication penetrate better -Advised patient to continue with good skin cream for callus skin and to the right third toe medial nail fold to use a little bit of Neosporin to this area to help soften the nail fold and to help flaking or peeling off of the callus that is likely from friction from her right third toe rubbing against the second toe -Also dispensed toe spacer for patient to use in between right second and third toes -Recommended good supportive shoes and advised use of OTC insert.  Advised patient to limit activities to tolerance and to continue with 4 miles instead of trying to walk 5 miles and after several weeks if she is doing good may slowly increase back to her goal of 5 miles of walking on the treadmill -Explained and dispensed to patient daily stretching exercises. -Recommend patient to ice affected area 1-2x daily. -Patient to return to office if no better after a month or sooner if problems or questions arise.  Landis Martins,  DPM

## 2020-05-16 ENCOUNTER — Other Ambulatory Visit: Payer: Medicare HMO

## 2020-05-23 ENCOUNTER — Other Ambulatory Visit: Payer: Medicare HMO

## 2020-05-23 ENCOUNTER — Other Ambulatory Visit: Payer: Self-pay

## 2020-05-23 DIAGNOSIS — E039 Hypothyroidism, unspecified: Secondary | ICD-10-CM | POA: Diagnosis not present

## 2020-05-24 LAB — TSH: TSH: 6.16 u[IU]/mL — ABNORMAL HIGH (ref 0.450–4.500)

## 2020-05-24 LAB — T4: T4, Total: 9.4 ug/dL (ref 4.5–12.0)

## 2020-05-24 LAB — T3, FREE: T3, Free: 2.2 pg/mL (ref 2.0–4.4)

## 2020-06-19 ENCOUNTER — Other Ambulatory Visit: Payer: Self-pay | Admitting: Nurse Practitioner

## 2020-06-19 DIAGNOSIS — E039 Hypothyroidism, unspecified: Secondary | ICD-10-CM

## 2020-07-09 ENCOUNTER — Other Ambulatory Visit: Payer: Self-pay

## 2020-07-09 ENCOUNTER — Ambulatory Visit (INDEPENDENT_AMBULATORY_CARE_PROVIDER_SITE_OTHER): Payer: Medicare HMO | Admitting: Nurse Practitioner

## 2020-07-09 ENCOUNTER — Encounter: Payer: Self-pay | Admitting: Nurse Practitioner

## 2020-07-09 VITALS — BP 118/68 | HR 55 | Temp 98.0°F | Ht 65.0 in | Wt 221.0 lb

## 2020-07-09 DIAGNOSIS — I10 Essential (primary) hypertension: Secondary | ICD-10-CM

## 2020-07-09 DIAGNOSIS — R1084 Generalized abdominal pain: Secondary | ICD-10-CM | POA: Diagnosis not present

## 2020-07-09 DIAGNOSIS — E2839 Other primary ovarian failure: Secondary | ICD-10-CM | POA: Diagnosis not present

## 2020-07-09 DIAGNOSIS — Z23 Encounter for immunization: Secondary | ICD-10-CM | POA: Diagnosis not present

## 2020-07-09 DIAGNOSIS — E782 Mixed hyperlipidemia: Secondary | ICD-10-CM | POA: Diagnosis not present

## 2020-07-09 DIAGNOSIS — E039 Hypothyroidism, unspecified: Secondary | ICD-10-CM | POA: Diagnosis not present

## 2020-07-09 DIAGNOSIS — Z8719 Personal history of other diseases of the digestive system: Secondary | ICD-10-CM | POA: Diagnosis not present

## 2020-07-09 DIAGNOSIS — R7303 Prediabetes: Secondary | ICD-10-CM

## 2020-07-09 MED ORDER — PNEUMOCOCCAL 13-VAL CONJ VACC IM SUSP: 0.5000 mL | INTRAMUSCULAR | 0 refills | Status: AC

## 2020-07-09 NOTE — Progress Notes (Signed)
This visit occurred during the SARS-CoV-2 public health emergency.  Safety protocols were in place, including screening questions prior to the visit, additional usage of staff PPE, and extensive cleaning of exam room while observing appropriate contact time as indicated for disinfecting solutions.  Subjective:     Patient ID: Nancy [name] , female    DOB: 09/26/1951 , 69 y.o.   MRN: [id number]   Chief Complaint  Patient presents with  . Hypertension  . Prediabetes  . Abdominal Pain    HPI  Patient presents today for hypertension, and prediabetes follow up. Pt states she has been having stomach pain, but pain is at a zero at the moment.  She has been having more episodes of feeling hungry in recent months.  Wt Readings from Last 3 Encounters: 07/09/20 : 221 lb (100.2 kg) 04/10/20 : 209 lb 6.4 oz (95 kg) 02/14/20 : 207 lb (93.9 kg)  She is exercising by walking 2 hours a day - walking 4 miles.  She is not using any light hand weights for strength training.  She is inquiring about a medication to suppress her appetite.  She is planning to have her skin repair but is waiting until she loses more weight, would like to get down to 200 lbs. She has been trying to drink more water.  Abdominal pain improves after eating.   Hypertension This is a chronic problem. The current episode started more than 1 year ago. The problem is unchanged. The problem is controlled. Pertinent negatives include no anxiety, chest pain, headaches, palpitations, peripheral edema or shortness of breath. There are no associated agents to hypertension. Risk factors for coronary artery disease include obesity and sedentary lifestyle. Past treatments include diuretics. There are no compliance problems.  There is no history of angina. There is no history of chronic renal disease.  Abdominal Pain Pertinent negatives include no headaches.     Past Medical History:  Diagnosis Date  . Hyperlipidemia   . Hypertension   .  Prediabetes   . Thyroid disease      Family History  Problem Relation Age of Onset  . Diabetes Mother   . Hypertension Mother   . Hypertension Father   . Heart disease Father   . Diabetes Sister   . Kidney failure Sister   . Diabetes Brother   . Kidney disease Brother   . Diabetes Sister   . Diabetes Sister   . Asthma Sister   . Cancer Maternal Grandfather   . Cancer Paternal Grandfather      Current Outpatient Medications:  .  atorvastatin (LIPITOR) 20 MG tablet, TAKE 1 TABLET BY MOUTH EVERY NIGHT FOR CHOLESTEROL, Disp: 90 tablet, Rfl: 0 .  azelastine (OPTIVAR) 0.05 % ophthalmic solution, Place 1 drop into both eyes 2 (two) times daily., Disp: , Rfl:  .  Blood Glucose Monitoring Suppl (ACCU-CHEK GUIDE) w/Device KIT, 1 Device by Does not apply route daily., Disp: 1 kit, Rfl: 0 .  glucose blood (ACCU-CHEK GUIDE) test strip, Use as instructed, Disp: 100 each, Rfl: 12 .  levothyroxine (SYNTHROID) 150 MCG tablet, TAKE 2 TABLETS ON WED AND SATURDAY AND ONE TABLET ONCE A DAY THE OTHER DAYS, Disp: 180 tablet, Rfl: 0 .  linaclotide (LINZESS) 72 MCG capsule, Take 1 capsule (72 mcg total) by mouth daily before breakfast., Disp: 90 capsule, Rfl: 1 .  spironolactone (ALDACTONE) 50 MG tablet, TAKE 1 TABLET BY MOUTH EVERY DAY, Disp: 90 tablet, Rfl: 1 .  triamcinolone cream (KENALOG) 0.1 %, TAKE  1 THIN LAYER TWICE A DAY AS NEEDED FOR 30 DAY(S), Disp: 60 g, Rfl: 0 .  pneumococcal 13-valent conjugate vaccine (PREVNAR 13) SUSP injection, Inject 0.5 mLs into the muscle tomorrow at 10 am for 1 dose., Disp: 0.5 mL, Rfl: 0   No Known Allergies   Review of Systems  Constitutional: Negative.  Negative for fatigue.  HENT: Negative.   Eyes: Negative.   Respiratory: Negative.  Negative for shortness of breath.   Cardiovascular: Negative.  Negative for chest pain and palpitations.  Gastrointestinal: Positive for abdominal pain (no current pain).  Endocrine: Negative for polydipsia, polyphagia and  polyuria.  Musculoskeletal: Negative.   Skin: Negative.   Neurological: Negative for dizziness and headaches.  Psychiatric/Behavioral: Negative.      Today's Vitals   07/09/20 1100  BP: 118/68  Pulse: (!) 55  Temp: 98 F (36.7 C)  TempSrc: Oral  Weight: 221 lb (100.2 kg)  Height: 5' 5"  (1.651 m)  PainSc: 0-No pain   Body mass index is 36.78 kg/m.   Objective:  Physical Exam Constitutional:      General: She is not in acute distress.    Appearance: She is well-developed. She is obese.  Cardiovascular:     Rate and Rhythm: Normal rate and regular rhythm.     Heart sounds: Normal heart sounds. No murmur heard.   Pulmonary:     Effort: Pulmonary effort is normal.     Breath sounds: Normal breath sounds.  Abdominal:     General: Bowel sounds are normal. There is no distension.     Palpations: Abdomen is soft.     Tenderness: There is no abdominal tenderness.  Skin:    General: Skin is warm and dry.     Capillary Refill: Capillary refill takes less than 2 seconds.  Neurological:     General: No focal deficit present.     Mental Status: She is alert.     Cranial Nerves: No cranial nerve deficit.  Psychiatric:        Mood and Affect: Mood normal. Mood is not anxious.        Behavior: Behavior normal.         Assessment And Plan:     1. Essential hypertension Comments: Chronic, well controlled continue current medications, discussed how blood pressure affects kidneys - CMP14+EGFR  2. Prediabetes Comments: Has had an increase in weight, will check HgbA1c  Discussed the chance this is elevated She is not currently interested in any medications - Hemoglobin A1c - CMP14+EGFR  3. Acquired hypothyroidism Comments: Chronic, improved after increase of medication Will check thyroid levels - T4 - T3, free - TSH  4. Mixed hyperlipidemia Comments: Chronic, stable Continue current medications, tolerating well - Lipid panel - CMP14+EGFR  5. Generalized abdominal  pain Comments: Intermittent abdominal pain, will check h pylori but may be related to IBS Encouraged to take a probiotic daily - H Pylori, IGM, IGG, IGA AB  6. Decreased estrogen level - DG Bone Density; Future  7. Encounter for immunization - pneumococcal 13-valent conjugate vaccine (PREVNAR 13) SUSP injection; Inject 0.5 mLs into the muscle tomorrow at 10 am for 1 dose.  Dispense: 0.5 mL; Refill: 0  8. History of irritable bowel syndrome     Patient was given opportunity to ask questions. Patient verbalized understanding of the plan and was able to repeat key elements of the plan. All questions were answered to their satisfaction.  [name], FNP   I, [name], FNP, have  reviewed all documentation for this visit. The documentation on 07/09/20 for the exam, diagnosis, procedures, and orders are all accurate and complete.  THE PATIENT IS ENCOURAGED TO PRACTICE SOCIAL DISTANCING DUE TO THE COVID-19 PANDEMIC.

## 2020-07-09 NOTE — Patient Instructions (Signed)
   A high fiber diet with plenty of fluids (up to 8 glasses of water daily) is suggested to relieve these symptoms.  Metamucil, 1 tablespoon once or twice daily can be used to keep bowels regular if needed.  Avoid sweets if possible  Change your exercise routine as necessary

## 2020-07-10 LAB — HEMOGLOBIN A1C
Est. average glucose Bld gHb Est-mCnc: 114 mg/dL
Hgb A1c MFr Bld: 5.6 % (ref 4.8–5.6)

## 2020-07-10 LAB — CMP14+EGFR
ALT: 17 IU/L (ref 0–32)
AST: 24 IU/L (ref 0–40)
Albumin/Globulin Ratio: 1.7 (ref 1.2–2.2)
Albumin: 4.5 g/dL (ref 3.8–4.8)
Alkaline Phosphatase: 63 IU/L (ref 48–121)
BUN/Creatinine Ratio: 20 (ref 12–28)
BUN: 19 mg/dL (ref 8–27)
Bilirubin Total: 0.7 mg/dL (ref 0.0–1.2)
CO2: 23 mmol/L (ref 20–29)
Calcium: 9.5 mg/dL (ref 8.7–10.3)
Chloride: 104 mmol/L (ref 96–106)
Creatinine, Ser: 0.95 mg/dL (ref 0.57–1.00)
GFR calc Af Amer: 71 mL/min/{1.73_m2} (ref >59)
GFR calc non Af Amer: 61 mL/min/{1.73_m2} (ref >59)
Globulin, Total: 2.7 g/dL (ref 1.5–4.5)
Glucose: 73 mg/dL (ref 65–99)
Potassium: 4.4 mmol/L (ref 3.5–5.2)
Sodium: 141 mmol/L (ref 134–144)
Total Protein: 7.2 g/dL (ref 6.0–8.5)

## 2020-07-10 LAB — LIPID PANEL
Chol/HDL Ratio: 2.6 ratio (ref 0.0–4.4)
Cholesterol, Total: 160 mg/dL (ref 100–199)
HDL: 61 mg/dL (ref >39)
LDL Chol Calc (NIH): 87 mg/dL (ref 0–99)
Triglycerides: 60 mg/dL (ref 0–149)
VLDL Cholesterol Cal: 12 mg/dL (ref 5–40)

## 2020-07-10 LAB — T4: T4, Total: 9.1 ug/dL (ref 4.5–12.0)

## 2020-07-10 LAB — T3, FREE: T3, Free: 2 pg/mL (ref 2.0–4.4)

## 2020-07-10 LAB — H PYLORI, IGM, IGG, IGA AB
H pylori, IgM Abs: <9 units (ref 0.0–8.9)
H. pylori, IgA Abs: 24.4 units — ABNORMAL HIGH (ref 0.0–8.9)
H. pylori, IgG AbS: 5.87 Index Value — ABNORMAL HIGH (ref 0.00–0.79)

## 2020-07-10 LAB — TSH: TSH: 3.63 u[IU]/mL (ref 0.450–4.500)

## 2020-07-12 ENCOUNTER — Other Ambulatory Visit: Payer: Self-pay | Admitting: Nurse Practitioner

## 2020-07-22 ENCOUNTER — Telehealth: Payer: Self-pay | Admitting: Nurse Practitioner

## 2020-07-22 ENCOUNTER — Telehealth: Payer: Self-pay

## 2020-07-22 ENCOUNTER — Other Ambulatory Visit: Payer: Self-pay | Admitting: Nurse Practitioner

## 2020-07-22 DIAGNOSIS — R768 Other specified abnormal immunological findings in serum: Secondary | ICD-10-CM

## 2020-07-22 DIAGNOSIS — R69 Illness, unspecified: Secondary | ICD-10-CM | POA: Diagnosis not present

## 2020-07-22 MED ORDER — AMOXICILLIN 875 MG PO TABS: 875.0000 mg | ORAL_TABLET | Freq: Two times a day (BID) | ORAL | 0 refills | Status: DC

## 2020-07-22 MED ORDER — CLARITHROMYCIN 500 MG PO TABS: 500.0000 mg | ORAL_TABLET | Freq: Two times a day (BID) | ORAL | 0 refills | Status: AC

## 2020-07-22 MED ORDER — OMEPRAZOLE 20 MG PO CPDR: 20.0000 mg | DELAYED_RELEASE_CAPSULE | Freq: Every day | ORAL | 0 refills | Status: DC

## 2020-07-22 NOTE — Telephone Encounter (Signed)
Pt called asking for lab results °

## 2020-07-22 NOTE — Telephone Encounter (Signed)
Patient given results of her labs with the regimen to treat H pylori, she is to call once completed the course if not feeling better.

## 2020-08-27 ENCOUNTER — Other Ambulatory Visit: Payer: Self-pay | Admitting: Nurse Practitioner

## 2020-08-27 DIAGNOSIS — I10 Essential (primary) hypertension: Secondary | ICD-10-CM

## 2020-08-27 MED ORDER — AZELASTINE HCL 0.05 % OP SOLN: 1.0000 [drp] | Freq: Two times a day (BID) | OPHTHALMIC | 3 refills | Status: DC

## 2020-08-29 DIAGNOSIS — R69 Illness, unspecified: Secondary | ICD-10-CM | POA: Diagnosis not present

## 2020-09-04 ENCOUNTER — Telehealth: Payer: Self-pay | Admitting: *Deleted

## 2020-09-04 NOTE — Chronic Care Management (AMB) (Signed)
  Chronic Care Management   Outreach Note  09/04/2020 Name: Nancy [name] MRN: [id number] DOB: [birth year]  Nancy [name] is a 69 y.o. year old female who is a primary care patient of [name], [place]. I reached out to [place] by phone today in response to a referral sent by [name]'s health plan.     An unsuccessful telephone outreach was attempted today. The patient was referred to the case management team for assistance with care management and care coordination.   Follow Up Plan: The care management team will reach out to the patient again over the next 7 days.  If patient returns call to provider office, please advise to call [place] at [phone].  [place] [facility]

## 2020-09-10 DIAGNOSIS — I10 Essential (primary) hypertension: Secondary | ICD-10-CM | POA: Diagnosis not present

## 2020-09-10 DIAGNOSIS — H04129 Dry eye syndrome of unspecified lacrimal gland: Secondary | ICD-10-CM | POA: Diagnosis not present

## 2020-09-10 DIAGNOSIS — E039 Hypothyroidism, unspecified: Secondary | ICD-10-CM | POA: Diagnosis not present

## 2020-09-10 DIAGNOSIS — Z809 Family history of malignant neoplasm, unspecified: Secondary | ICD-10-CM | POA: Diagnosis not present

## 2020-09-10 DIAGNOSIS — R69 Illness, unspecified: Secondary | ICD-10-CM | POA: Diagnosis not present

## 2020-09-10 DIAGNOSIS — E785 Hyperlipidemia, unspecified: Secondary | ICD-10-CM | POA: Diagnosis not present

## 2020-09-10 DIAGNOSIS — K5900 Constipation, unspecified: Secondary | ICD-10-CM | POA: Diagnosis not present

## 2020-09-10 DIAGNOSIS — E669 Obesity, unspecified: Secondary | ICD-10-CM | POA: Diagnosis not present

## 2020-09-10 DIAGNOSIS — Z7722 Contact with and (suspected) exposure to environmental tobacco smoke (acute) (chronic): Secondary | ICD-10-CM | POA: Diagnosis not present

## 2020-09-10 DIAGNOSIS — L409 Psoriasis, unspecified: Secondary | ICD-10-CM | POA: Diagnosis not present

## 2020-09-11 ENCOUNTER — Other Ambulatory Visit: Payer: Self-pay | Admitting: Nurse Practitioner

## 2020-09-11 DIAGNOSIS — E039 Hypothyroidism, unspecified: Secondary | ICD-10-CM

## 2020-09-11 NOTE — Chronic Care Management (AMB) (Signed)
  Chronic Care Management   Note  09/11/2020 Name: Nancy [name] MRN: [id number] DOB: [birth year]  Nancy [name] is a 69 y.o. year old female who is a primary care patient of [name], [place]. I reached out to Nancy [name] by phone today in response to a referral sent by Nancy [name]'s health plan.     Nancy [name] was given information about Chronic Care Management services today including:  1. CCM service includes personalized support from designated clinical staff supervised by her physician, including individualized plan of care and coordination with other care providers 2. 24/7 contact phone numbers for assistance for urgent and routine care needs. 3. Service will only be billed when office clinical staff spend 20 minutes or more in a month to coordinate care. 4. Only one practitioner may furnish and bill the service in a calendar month. 5. The patient may stop CCM services at any time (effective at the end of the month) by phone call to the office staff. 6. The patient will be responsible for cost sharing (co-pay) of up to 20% of the service fee (after annual deductible is met).  Patient agreed to services and verbal consent obtained.   Follow up plan: Telephone appointment with care management team member scheduled for: 09/26/2020 [facility]

## 2020-09-20 ENCOUNTER — Telehealth: Payer: Self-pay

## 2020-09-20 NOTE — Chronic Care Management (AMB) (Signed)
    Chronic Care Management Pharmacy Assistant   Name: Anvitha Hutmacher  MRN: [id number] DOB: [birth year]   Reason for Encounter: Medication Review/ Initial Questions for Pharmacist visit for 09/26/2020.  Patient Questions:  Have you seen any other providers since your last visit? Yes   Any changes in your medications or health? Yes, Synthroid one tablet a day  Any side effects from any medications? No.  Do you have an symptoms or problems not managed by your medications? No.  Any concerns about your health right now? No.  Has your provider asked that you check blood pressure, blood sugar, or follow special diet at home? Blood sugar twice a day.   Do you get any type of exercise on a regular basis?  Yes. Treadmill  50 minutes every day  Can you think of a goal you would like to reach for your health? Patent would like to continue to lose weight  ( has lost 100  Pounds in last four years.)  Do you have any problems getting your medications? Yes.  Patient states that her eye drops are to expensive to purchase.  Is there anything that you would like to discuss during the appointment? Patient would like to discuss her blood pressure medications, she would like to know when is best time of day  to take.   Patient is aware to have all medications and supplements available at time of telephone visit on 09/26/2020 @ 2:00 PM.      PCP : [name], FNP  Allergies:  No Known Allergies  Medications: Outpatient Encounter Medications as of 09/20/2020  Medication Sig  . amoxicillin (AMOXIL) 875 MG tablet Take 1 tablet (875 mg total) by mouth 2 (two) times daily.  Marland Kitchen atorvastatin (LIPITOR) 20 MG tablet TAKE 1 TABLET BY MOUTH EVERY NIGHT FOR CHOLESTEROL  . azelastine (OPTIVAR) 0.05 % ophthalmic solution Place 1 drop into both eyes 2 (two) times daily.  . Blood Glucose Monitoring Suppl (ACCU-CHEK GUIDE) w/Device KIT 1 Device by Does not apply route daily.  Marland Kitchen glucose blood (ACCU-CHEK GUIDE)  test strip Use as instructed  . levothyroxine (SYNTHROID) 150 MCG tablet TAKE 2 TABLETS ON WED AND SATURDAY AND ONE TABLET ONCE A DAY THE OTHER DAYS  . linaclotide (LINZESS) 72 MCG capsule Take 1 capsule (72 mcg total) by mouth daily before breakfast.  . omeprazole (PRILOSEC) 20 MG capsule Take 1 capsule (20 mg total) by mouth daily.  Marland Kitchen spironolactone (ALDACTONE) 50 MG tablet TAKE 1 TABLET BY MOUTH EVERY DAY  . triamcinolone cream (KENALOG) 0.1 % APPLY A THIN LAYER TWICE A DAY AS NEEDED   No facility-administered encounter medications on file as of 09/20/2020.    Current Diagnosis: Patient Active Problem List   Diagnosis Date Noted  . Right bundle branch block 02/08/2020  . 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 Bifascicular block 02/08/2020  . Family history of early CAD 02/08/2020  . Prediabetes 09/25/2019  . Dry eyes 09/25/2019  . Mixed hyperlipidemia 09/25/2019  . Essential hypertension 09/25/2019      Follow-Up:  Pharmacist Review   Jannette Fogo, CPP notified.  Judithann Sheen, [facility] Clinical Pharmacist Assistant [phone]

## 2020-09-26 ENCOUNTER — Other Ambulatory Visit: Payer: Self-pay

## 2020-09-26 ENCOUNTER — Ambulatory Visit: Payer: Medicare HMO

## 2020-09-26 DIAGNOSIS — E782 Mixed hyperlipidemia: Secondary | ICD-10-CM

## 2020-09-26 DIAGNOSIS — I10 Essential (primary) hypertension: Secondary | ICD-10-CM

## 2020-09-26 DIAGNOSIS — R7303 Prediabetes: Secondary | ICD-10-CM

## 2020-09-26 NOTE — Chronic Care Management (AMB) (Signed)
Chronic Care Management Pharmacy  Name: Nancy [name]  MRN: [id number] DOB: August 11, 1951  Chief Complaint/ HPI  Nancy [name],  68 y.o. , female presents for their Initial CCM visit with the clinical pharmacist via telephone due to COVID-19 Pandemic.  PCP : [name], FNP  Their chronic conditions include: Hypertension, Hyperlipidemia, Hypothyroidism and Prediabetes  Office Visits: 07/09/20 OV: HTN/Prediabetes follow up. Pt reports stomach pain. HTN controlled. Ordered bone density scan. Ordered Prevnar13 vaccine. H. Pylor positive. Will treat with amoxicillin/clarithromycin/omeprazole for 14 days. HgbA1c down to 5.6%. Thyroid hormones normal.  04/10/20 OV: HTN follow up. HgbA1c 5.7% stable. Liver enzymes slightly elevated, increase lemon water and cauliflower/broccoli intake. Thyroid levels are underactive, take 1 tablet levothyroxine all days except Saturday take 1/2 tablet. F/u lab visit in 4 weeks. Kidney function about the same. Pt reports bilateral foot pain to the ball of each foot (left worse than right). Referred to Podiatry. Constipation has improved. Now having regular bowel movements.   Consult Visits: 05/14/20 Podiatry OV w/ Dr. Cannon Kettle: Presents with complaint of arch pain. Xrays reviewed. Recommend monitor nevus on bottom of left foot. Monitor.If changes, may warrant biopsy. Advised to apply Vicks VapoRub to thickened toenails every night after bath/shower and use nail file to help medication penetrate better. Continue use of skin cream for callus skin and Neosporin to nail fold. Given toe spacer for right second and third toes. Recommend supportive shoes with OTC insert.   CCM Encounters:  Medications: Outpatient Encounter Medications as of 09/26/2020  Medication Sig  . atorvastatin (LIPITOR) 20 MG tablet TAKE 1 TABLET BY MOUTH EVERY NIGHT FOR CHOLESTEROL  . azelastine (OPTIVAR) 0.05 % ophthalmic solution Place 1 drop into both eyes 2 (two) times daily.  . Blood Glucose  Monitoring Suppl (ACCU-CHEK GUIDE) w/Device KIT 1 Device by Does not apply route daily.  . Cholecalciferol 50 MCG (2000 UT) TABS Take 1 tablet by mouth daily.  . Glucosamine HCl 1500 MG TABS Take 1 tablet by mouth daily.  Marland Kitchen glucose blood (ACCU-CHEK GUIDE) test strip Use as instructed  . Lactobacillus (PROBIOTIC ACIDOPHILUS PO) Take 1 tablet by mouth daily.  Marland Kitchen levothyroxine (SYNTHROID) 150 MCG tablet TAKE 2 TABLETS ON WED AND SATURDAY AND ONE TABLET ONCE A DAY THE OTHER DAYS (Patient taking differently: Take 150 mcg by mouth daily before breakfast. )  . Magnesium 250 MG TABS Take 1 tablet by mouth daily.  . Multiple Vitamins-Minerals (MULTIVITAMIN WOMEN 50+) TABS Take 1 tablet by mouth daily.  Marland Kitchen OVER THE COUNTER MEDICATION Take 1 tablet by mouth daily. Saffron extract 79m  . spironolactone (ALDACTONE) 50 MG tablet TAKE 1 TABLET BY MOUTH EVERY DAY  . triamcinolone cream (KENALOG) 0.1 % APPLY A THIN LAYER TWICE A DAY AS NEEDED  . Turmeric 500 MG TABS Take 2 tablets by mouth daily.  .Marland Kitchenamoxicillin (AMOXIL) 875 MG tablet Take 1 tablet (875 mg total) by mouth 2 (two) times daily.  .Marland Kitchenlinaclotide (LINZESS) 72 MCG capsule Take 1 capsule (72 mcg total) by mouth daily before breakfast. (Patient not taking: Reported on 09/27/2020)  . omeprazole (PRILOSEC) 20 MG capsule Take 1 capsule (20 mg total) by mouth daily.   No facility-administered encounter medications on file as of 09/26/2020.    Current Diagnosis/Assessment:  SDOH Interventions     Most Recent Value  SDOH Interventions  Financial Strain Interventions Other (Comment)  [Discussed cost of azelastine eye drops. Reviewed formulary. Discussed alternatives as well as cost savings options (GoodRx, etc)]  Goals Addressed            This Visit's Progress   . Pharmacy Care Plan       CARE PLAN ENTRY (see longitudinal plan of care for additional care plan information)  Current Barriers:  . Chronic Disease Management support, education,  and care coordination needs related to Hypertension, Hyperlipidemia, and Prediabetes   Hypertension BP Readings from Last 3 Encounters:  07/09/20 118/68  05/14/20 (!) 104/58  04/10/20 116/80   . Pharmacist Clinical Goal(s): o Over the next 180 days, patient will work with PharmD and providers to maintain BP goal <140/90 . Current regimen:  o Spironolactone 12m daily . Interventions: o Provided dietary and exercise recommendations o Advised patient she could take spironolactone in the morning - Determined patient has experienced problems with nighttime urination o Discussed that headaches can sometimes be a sign of high blood pressure . Patient self care activities - Over the next 180 days, patient will: o Check BP periodically and if symptomatic, document, and provide at future appointments o Ensure daily salt intake < 2300 mg/day o Continue current exercise routine and get at least 30 minutes of exercise 5 times weekly  Hyperlipidemia Lab Results  Component Value Date/Time   LDLCALC 87 07/09/2020 02:21 PM   . Pharmacist Clinical Goal(s): o Over the next 180 days, patient will work with PharmD and providers to maintain LDL goal < 100 . Current regimen:  o Atorvastatin 210mdaily . Interventions: o Discussed appropriate goals for LDL (less than 100), HDL (greater than 50), and triglycerides (less than 150) o Provided dietary and exercise recommendations . Patient self care activities - Over the next 180 days, patient will: o Continue current exercise routine and get at least 30 minutes of exercise 5 times weekly  Prediabetes Lab Results  Component Value Date/Time   HGBA1C 5.6 07/09/2020 02:21 PM   HGBA1C 5.7 (H) 04/10/2020 02:06 PM   . Pharmacist Clinical Goal(s): o Over the next 180 days, patient will work with PharmD and providers to maintain A1c goal  < 5.7% . Current regimen:  o N/A . Interventions: o Provided dietary and exercise recommendations o Discussed the  appropriate amount of carbohydrates patient should eat with each meal (45-60 grams per meal, 15 grams per snack) o Discussed PLATE method (include information) o Recommend patient increase water intake to 64 ounces daily o Discussed the effects of exercise to lower Hemoglobin A1c (150 minutes weekly can lower A1c by 0.7%) . Patient self care activities - Over the next 180 days, patient will: o Check blood sugar once daily, document, and provide at future appointments o Contact provider with any episodes of hypoglycemia o Continue current exercise routine and get at least 30 minutes of exercise 5 times weekly o Limit portion sizes of carbohydrates with each meal o Focus on eating well- balanced meals utilizing the PLATE method o Increase water intake to 64 ounces daily  Medication management . Pharmacist Clinical Goal(s): o Over the next 180 days, patient will work with PharmD and providers to maintain optimal medication adherence . [facility] . Interventions o Comprehensive medication review performed. o Continue current medication management strategy o Discussed recommended vaccine (COVID booster, 2nd dose of Shingrix, and Prevnar13) and timing o Reviewed [employer] formulary for cheaper alternative to azelastine eye drops - Determined all antihistamine eye drops are Tier 3 or 4 o Discussed checking price of azelastine eye drops using coupon such as GoodRx, checking price without insurance, or trying  over the counter allergy eye drops (olopatadine) o Advised patient she could check with eye doctor regarding alternatives to azelastine . Patient self care activities - Over the next 180 days, patient will: o Focus on medication adherence by continued [place] of pill box o Take medications as prescribed o Report any questions or concerns to PharmD and/or provider(s)  Initial goal documentation        Prediabetes   A1c goal <5.7%  Recent Relevant Labs: Lab Results    Component Value Date/Time   HGBA1C 5.6 07/09/2020 02:21 PM   HGBA1C 5.7 (H) 04/10/2020 02:06 PM   MICROALBUR 10 09/25/2019 04:40 PM    Last diabetic Eye exam:  Lab Results  Component Value Date/Time   HMDIABEYEEXA No Retinopathy 10/23/2019 12:00 AM    Last diabetic Foot exam: No results found for: HMDIABFOOTEX   Checking BG: Daily  Recent FBG Readings: 80-90s Recent pre-meal BG readings:  Recent 2hr PP BG readings:   Recent HS BG readings:   Patient has failed these meds in past: N/A Patient is currently controlled on the following medications: . N/A  We discussed:  . Diet extensively o Pt states that she battles with carbohydrates o Discussed how many carbohydrates pt should eat with each meal - 45g-60g carbohydrates with each meal, 15g with snacks o Recommend well-balanced diet (PLATE method) o Recommend pt drink 64 ounces of water daily o Pt loves drink mixes with Splenda - Discussed artificial sweeteners - Advised pt to limit artifical sweeteners . Exercise extensively o Exercises every day o Walks 4 miles per day (about 2 hours daily) o Discussed the role exercise plays in lowering HgbA1c (150 minutes weekly can lower HgbA1c up to 0.7%)  Plan Continue control with diet and exercise   Hyperlipidemia   LDL goal < 100  Lipid Panel     Component Value Date/Time   CHOL 160 07/09/2020 1421   TRIG 60 07/09/2020 1421   HDL 61 07/09/2020 1421   LDLCALC 87 07/09/2020 1421    Hepatic Function Latest Ref Rng & Units 07/09/2020 04/10/2020 01/11/2020  Total Protein 6.0 - 8.5 g/dL 7.2 8.1 7.3  Albumin 3.8 - 4.8 g/dL 4.5 4.6 4.7  AST 0 - 40 IU/L 24 46(H) 20  ALT 0 - 32 IU/L 17 40(H) 16  Alk Phosphatase 48 - 121 IU/L 63 172(H) 56  Total Bilirubin 0.0 - 1.2 mg/dL 0.7 0.4 0.6     The ASCVD Risk score (Minto., et al., 2013) failed to calculate for the following reasons:   Unable to determine if patient is Non-Hispanic African American   Patient has failed these  meds in past: N/A Patient is currently controlled on the following medications:  . Atorvastatin 31m daily  We discussed:   .Marland KitchenGoals for LDL, HDL, and triglycerides . Patient's cholesterol is good Diet and exercise extensively  Plan Continue current medications   Hypertension   BP goal is:  <140/90  Office blood pressures are  BP Readings from Last 3 Encounters:  07/09/20 118/68  05/14/20 (!) 104/58  04/10/20 116/80   Patient checks BP at home never Patient home BP readings are ranging: None to provide  Patient has failed these meds in the past: N/A Patient is currently controlled on the following medications:  . Spironolactone 526mdaily  We discussed: . Pt asked if she could take spironolactone in the morning instead of in the evening o Pt states she has had difficulty with nocturia in the past o  Advised pt that she could take spironolactone in the morning daily . Pt gets headaches sometimes when she eats too much salt . Recommend pt check BP if symptomatic Diet and exercise extensively  Plan Continue current medications   Hypothyroidism   Lab Results  Component Value Date/Time   TSH 3.630 07/09/2020 02:21 PM   TSH 6.160 (H) 05/23/2020 12:31 PM   Patient has failed these meds in past: N/A Patient is currently controlled on the following medications:  . Levothyroxine 144mg 1 tablet daily   We discussed:    Pt's thyroid hormone levels have normalized  Plan Continue current medications  Osteopenia / Osteoporosis   Last DEXA Scan: Ordered, scheduled for end of October   T-Score femoral neck:   T-Score total hip:   T-Score lumbar spine:   T-Score forearm radius:   10-year probability of major osteoporotic fracture:   10-year probability of hip fracture:   No results found for: VD25OH   Patient has failed these meds in past: N/A Patient is currently controlled on the following medications:   Vitamin D3 574m (2000 units) daily  We discussed:   Recommend [phone] units of vitamin D daily. Recommend 1200 mg of calcium daily from dietary and supplemental sources. Recommend weight-bearing and muscle strengthening exercises for building and maintaining bone density.   DEXA scan scheduled for later this month  Plan Continue current medications  Recommend Vitamin D level  Review DEXA scan results when available  Allergic Conjunctivitis   Patient has failed these meds in past: N/A Patient is currently controlled on the following medications:   Azelastine 0.05% 1 drop in each eye 2 times daily  We discussed:   Pt states that her eye drops were very expensive last time she went to pick them up at the pharmacy   Reviewed pt's insurance formulary  Azelastine eye drops are Tier 3 (Preferred brand with a $47 copay)  All anti-histamine eye drops are Tier 3 or 4 on pt's plan  Discussed options to help pt save on cost of eye drops  Recommend pt try and use GoodRx (without insurance)  Advised pt she can try and check the "cash price" for the medication at different pharmacies  Gave pt cash price with [facility]  Advised pt she could try over the counter antihistamine eye drops (olopatadine 0.1%)  Advised pt she could speak with eye doctor regarding cheaper alternatives  Plan Continue current medications   Psoriasis   Patient has failed these meds in past: N/A Patient is currently controlled on the following medications:   Triamcinolone 0.1% cream apply a thin layer twice daily as needed  We discussed:    Pt uses cream as needed for psoriasis  Plan Continue current medications  Health Maintenance   Patient is currently on the following medications:  . Turmeric 50016m tablets daily . Vitamin D3 70m33m2000 units) daily . Magnesium 270mg89mly . Glucosamine HCl 1500mg 11my . Women's 50+ Advanced multivitamin daily . Saffron extract 90mg d71m . Probiotics daily  We discussed:   . Was trying saffron  for appetite suppression . Was taking a vitamin for skin but ran out and does not know what it is . Discussed current OTC regimen is appropriate  Plan Continue current medications   Vaccines   Reviewed and discussed patient's vaccination history.    Immunization History  Administered Date(s) Administered  . Influenza, High Dose Seasonal PF 08/13/2019  . Influenza,inj,Quad PF,6+ Mos 08/13/2019  . PFIZER SARS-COV-2 Vaccination  02/16/2020, 03/13/2020  . Tdap 01/11/2020  . Zoster Recombinat (Shingrix) 08/29/2020   We discussed:  Pt has had yearly flu vaccine (08/29/20)  COVID booster vaccine due/available in November  Shingrix 2nd dose due between November and March of next year  Pneumonia  vaccine  Discussed separating vaccines if able  Plan Recommended patient receive the COVID booster vaccine followed by Prevnar 13, followed by 2nd dose of Shingrix at appropriate intervals   Medication Management   [facility] Uses pill box? Yes Pt endorses 100% compliance  We discussed:  . Importance of taking each medications daily as directed . Medication synchronization and adherence packaging available with [facility]  Plan Continue current medication management strategy    Follow up: 3 month phone visit  Jannette Fogo, PharmD Clinical Pharmacist [facility] [phone]

## 2020-09-27 NOTE — Patient Instructions (Addendum)
Visit Information  Goals Addressed            This Visit's Progress   . Pharmacy Care Plan       CARE PLAN ENTRY (see longitudinal plan of care for additional care plan information)  Current Barriers:  . Chronic Disease Management support, education, and care coordination needs related to Hypertension, Hyperlipidemia, and Prediabetes   Hypertension BP Readings from Last 3 Encounters:  07/09/20 118/68  05/14/20 (!) 104/58  04/10/20 116/80   . Pharmacist Clinical Goal(s): o Over the next 180 days, patient will work with PharmD and providers to maintain BP goal <140/90 . Current regimen:  o Spironolactone 50mg  daily . Interventions: o Provided dietary and exercise recommendations o Advised patient she could take spironolactone in the morning - Determined patient has experienced problems with nighttime urination o Discussed that headaches can sometimes be a sign of high blood pressure . Patient self care activities - Over the next 180 days, patient will: o Check BP periodically and if symptomatic, document, and provide at future appointments o Ensure daily salt intake < 2300 mg/day o Continue current exercise routine and get at least 30 minutes of exercise 5 times weekly  Hyperlipidemia Lab Results  Component Value Date/Time   LDLCALC 87 07/09/2020 02:21 PM   . Pharmacist Clinical Goal(s): o Over the next 180 days, patient will work with PharmD and providers to maintain LDL goal < 100 . Current regimen:  o Atorvastatin 20mg  daily . Interventions: o Discussed appropriate goals for LDL (less than 100), HDL (greater than 50), and triglycerides (less than 150) o Provided dietary and exercise recommendations . Patient self care activities - Over the next 180 days, patient will: o Continue current exercise routine and get at least 30 minutes of exercise 5 times weekly  Prediabetes Lab Results  Component Value Date/Time   HGBA1C 5.6 07/09/2020 02:21 PM   HGBA1C 5.7 (H)  04/10/2020 02:06 PM   . Pharmacist Clinical Goal(s): o Over the next 180 days, patient will work with PharmD and providers to maintain A1c goal  < 5.7% . Current regimen:  o N/A . Interventions: o Provided dietary and exercise recommendations o Discussed the appropriate amount of carbohydrates patient should eat with each meal (45-60 grams per meal, 15 grams per snack) o Discussed PLATE method (include information) o Recommend patient increase water intake to 64 ounces daily o Discussed the effects of exercise to lower Hemoglobin A1c (150 minutes weekly can lower A1c by 0.7%) . Patient self care activities - Over the next 180 days, patient will: o Check blood sugar once daily, document, and provide at future appointments o Contact provider with any episodes of hypoglycemia o Continue current exercise routine and get at least 30 minutes of exercise 5 times weekly o Limit portion sizes of carbohydrates with each meal o Focus on eating well- balanced meals utilizing the PLATE method o Increase water intake to 64 ounces daily  Medication management . Pharmacist Clinical Goal(s): o Over the next 180 days, patient will work with PharmD and providers to maintain optimal medication adherence . [facility] . Interventions o Comprehensive medication review performed. o Continue current medication management strategy o Discussed recommended vaccine (COVID booster, 2nd dose of Shingrix, and Prevnar13) and timing o Reviewed [employer] formulary for cheaper alternative to azelastine eye drops - Determined all antihistamine eye drops are Tier 3 or 4 o Discussed checking price of azelastine eye drops using coupon such as GoodRx, checking price without insurance,  or trying over the counter allergy eye drops (olopatadine) o Advised patient she could check with eye doctor regarding alternatives to azelastine . Patient self care activities - Over the next 180 days, patient will: o Focus on  medication adherence by continued [place] of pill box o Take medications as prescribed o Report any questions or concerns to PharmD and/or provider(s)  Initial goal documentation        Nancy [name] was given information about Chronic Care Management services today including:  1. CCM service includes personalized support from designated clinical staff supervised by her physician, including individualized plan of care and coordination with other care providers 2. 24/7 contact phone numbers for assistance for urgent and routine care needs. 3. Standard insurance, coinsurance, copays and deductibles apply for chronic care management only during months in which we provide at least 20 minutes of these services. Most insurances cover these services at 100%, however patients may be responsible for any copay, coinsurance and/or deductible if applicable. This service may help you avoid the need for more expensive face-to-face services. 4. Only one practitioner may furnish and bill the service in a calendar month. 5. The patient may stop CCM services at any time (effective at the end of the month) by phone call to the office staff.  Patient agreed to services and verbal consent obtained.   The patient verbalized understanding of instructions provided today and agreed to receive a mailed copy of patient instruction and/or educational materials. Telephone follow up appointment with pharmacy team member scheduled for:12/27/20 @ 3:30 PM  Jannette Fogo, PharmD Clinical Pharmacist [facility] [phone]   Prediabetes Eating Plan Prediabetes is a condition that causes blood sugar (glucose) levels to be higher than normal. This increases the risk for developing diabetes. In order to prevent diabetes from developing, your health care provider may recommend a diet and other lifestyle changes to help you:  Control your blood glucose levels.  Improve your cholesterol levels.  Manage  your blood pressure. Your health care provider may recommend working with a diet and nutrition specialist (dietitian) to make a meal plan that is best for you. What are tips for following this plan? Lifestyle  Set weight loss goals with the help of your health care team. It is recommended that most people with prediabetes lose 7% of their current body weight.  Exercise for at least 30 minutes at least 5 days a week.  Attend a support group or seek ongoing support from a mental health counselor.  Take over-the-counter and prescription medicines only as told by your health care provider. Reading food labels  Read food labels to check the amount of fat, salt (sodium), and sugar in prepackaged foods. Avoid foods that have: ? Saturated fats. ? Trans fats. ? Added sugars.  Avoid foods that have more than 300 milligrams (mg) of sodium per serving. Limit your daily sodium intake to less than 2,300 mg each day. Shopping  Avoid buying pre-made and processed foods. Cooking  Cook with olive oil. Do not use butter, lard, or ghee.  Bake, broil, grill, or boil foods. Avoid frying. Meal planning   Work with your dietitian to develop an eating plan that is right for you. This may include: ? Tracking how many calories you take in. Use a food diary, notebook, or mobile application to track what you eat at each meal. ? Using the glycemic index (GI) to plan your meals. The index tells you how quickly a food will raise your blood  glucose. Choose low-GI foods. These foods take a longer time to raise blood glucose.  Consider following a Mediterranean diet. This diet includes: ? Several servings each day of fresh fruits and vegetables. ? Eating fish at least twice a week. ? Several servings each day of whole grains, beans, nuts, and seeds. ? Using olive oil instead of other fats. ? Moderate alcohol consumption. ? Eating small amounts of red meat and whole-fat dairy.  If you have high blood  pressure, you may need to limit your sodium intake or follow a diet such as the DASH eating plan. DASH is an eating plan that aims to lower high blood pressure. What foods are recommended? The items listed below may not be a complete list. Talk with your dietitian about what dietary choices are best for you. Grains Whole grains, such as whole-wheat or whole-grain breads, crackers, cereals, and pasta. Unsweetened oatmeal. Bulgur. Barley. Quinoa. Brown rice. Corn or whole-wheat flour tortillas or taco shells. Vegetables Lettuce. Spinach. Peas. Beets. Cauliflower. Cabbage. Broccoli. Carrots. Tomatoes. Squash. Eggplant. Herbs. Peppers. Onions. Cucumbers. Brussels sprouts. Fruits Berries. Bananas. Apples. Oranges. Grapes. Papaya. Mango. Pomegranate. Kiwi. Grapefruit. Cherries. Meats and other protein foods Seafood. Poultry without skin. Lean cuts of pork and beef. Tofu. Eggs. Nuts. Beans. Dairy Low-fat or fat-free dairy products, such as yogurt, cottage cheese, and cheese. Beverages Water. Tea. Coffee. Sugar-free or diet soda. Seltzer water. Lowfat or no-fat milk. Milk alternatives, such as soy or almond milk. Fats and oils Olive oil. Canola oil. Sunflower oil. Grapeseed oil. Avocado. Walnuts. Sweets and desserts Sugar-free or low-fat pudding. Sugar-free or low-fat ice cream and other frozen treats. Seasoning and other foods Herbs. Sodium-free spices. Mustard. Relish. Low-fat, low-sugar ketchup. Low-fat, low-sugar barbecue sauce. Low-fat or fat-free mayonnaise. What foods are not recommended? The items listed below may not be a complete list. Talk with your dietitian about what dietary choices are best for you. Grains Refined white flour and flour products, such as bread, pasta, snack foods, and cereals. Vegetables Canned vegetables. Frozen vegetables with butter or cream sauce. Fruits Fruits canned with syrup. Meats and other protein foods Fatty cuts of meat. Poultry with skin. Breaded or  fried meat. Processed meats. Dairy Full-fat yogurt, cheese, or milk. Beverages Sweetened drinks, such as sweet iced tea and soda. Fats and oils Butter. Lard. Ghee. Sweets and desserts Baked goods, such as cake, cupcakes, pastries, cookies, and cheesecake. Seasoning and other foods Spice mixes with added salt. Ketchup. Barbecue sauce. Mayonnaise. Summary  To prevent diabetes from developing, you may need to make diet and other lifestyle changes to help control blood sugar, improve cholesterol levels, and manage your blood pressure.  Set weight loss goals with the help of your health care team. It is recommended that most people with prediabetes lose 7 percent of their current body weight.  Consider following a Mediterranean diet that includes plenty of fresh fruits and vegetables, whole grains, beans, nuts, seeds, fish, lean meat, low-fat dairy, and healthy oils. This information is not intended to replace advice given to you by your health care provider. Make sure you discuss any questions you have with your health care provider. Document Revised: 03/31/2019 Document Reviewed: 02/10/2017 Elsevier Patient Education  2020 [employer].

## 2020-10-03 ENCOUNTER — Telehealth: Payer: Self-pay | Admitting: Pharmacist

## 2020-10-03 ENCOUNTER — Other Ambulatory Visit: Payer: Self-pay | Admitting: Nurse Practitioner

## 2020-10-03 NOTE — Chronic Care Management (AMB) (Signed)
Chronic Care Management Pharmacy Assistant   Name: Nancy [name]  MRN: [id number] DOB: 06/11/1951  Reason for Encounter: Medication Review/ Care Coordination of [place]  Patient would like to have her Azelastine 0.05% ophthalmic solution- 1 drop both eyes twice daily received from [facility]. Patient goes to [facility]. Will contact their office to get a new prescription sent to [facility]. Called patient to see how much medication she has left to know how to coordinate delivery and patient states she is ok for 1-2 weeks, opened a new bottle recently. Began to discuss the advantages of [facility] and home delivery services, inquired if she would like to have all of her medications transferred over to start with an Adherence program. Patient was wondering about cost, I informed patient that I will check a medication cost analysis on all her medications and [facility], if we meet or beat the prices offer there we can handle all of her medications and refills request. Patient was very excited about this program. Patient aware I will follow up with cost analysis review. Patient returned call a few moment later and just wanted to verify that she was going to be getting the Azelastine 0.05% for cash price of $22, I confirmed that the pharmacy was aware of her pay cash price versus filing insurance. Patient also inquired if she did not want delivery can she pick up her medications. Informed patient she could pick up her medications at [facility], that would not be a problem.    PCP : [name], FNP  Allergies:  No Known Allergies  Medications: Outpatient Encounter Medications as of 10/03/2020  Medication Sig  . amoxicillin (AMOXIL) 875 MG tablet Take 1 tablet (875 mg total) by mouth 2 (two) times daily.  Marland Kitchen atorvastatin (LIPITOR) 20 MG tablet TAKE 1 TABLET BY MOUTH EVERY NIGHT FOR CHOLESTEROL  . azelastine (OPTIVAR) 0.05 %  ophthalmic solution Place 1 drop into both eyes 2 (two) times daily.  . Blood Glucose Monitoring Suppl (ACCU-CHEK GUIDE) w/Device KIT 1 Device by Does not apply route daily.  . Cholecalciferol 50 MCG (2000 UT) TABS Take 1 tablet by mouth daily.  . Glucosamine HCl 1500 MG TABS Take 1 tablet by mouth daily.  Marland Kitchen glucose blood (ACCU-CHEK GUIDE) test strip Use as instructed  . Lactobacillus (PROBIOTIC ACIDOPHILUS PO) Take 1 tablet by mouth daily.  Marland Kitchen levothyroxine (SYNTHROID) 150 MCG tablet TAKE 2 TABLETS ON WED AND SATURDAY AND ONE TABLET ONCE A DAY THE OTHER DAYS (Patient taking differently: Take 150 mcg by mouth daily before breakfast. )  . linaclotide (LINZESS) 72 MCG capsule Take 1 capsule (72 mcg total) by mouth daily before breakfast. (Patient not taking: Reported on 09/27/2020)  . Magnesium 250 MG TABS Take 1 tablet by mouth daily.  . Multiple Vitamins-Minerals (MULTIVITAMIN WOMEN 50+) TABS Take 1 tablet by mouth daily.  Marland Kitchen omeprazole (PRILOSEC) 20 MG capsule Take 1 capsule (20 mg total) by mouth daily.  Marland Kitchen OVER THE COUNTER MEDICATION Take 1 tablet by mouth daily. Saffron extract 31m  . spironolactone (ALDACTONE) 50 MG tablet TAKE 1 TABLET BY MOUTH EVERY DAY  . triamcinolone cream (KENALOG) 0.1 % APPLY A THIN LAYER TWICE A DAY AS NEEDED  . Turmeric 500 MG TABS Take 2 tablets by mouth daily.   No facility-administered encounter medications on file as of 10/03/2020.    Current Diagnosis: Patient Active Problem List   Diagnosis Date Noted  . Right bundle  branch block 02/08/2020  . 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 Bifascicular block 02/08/2020  . Family history of early CAD 02/08/2020  . Prediabetes 09/25/2019  . Dry eyes 09/25/2019  . Mixed hyperlipidemia 09/25/2019  . Essential hypertension 09/25/2019    Follow-Up:  Coordination of Enhanced Pharmacy Services, Medication Cost Review and Pharmacist Review- Medication Cost review completed, [facility] is preferred in network  pharmacy and prices in medications [facility], patient aware of the small difference in prices but informed that [facility] would be able to match price if she decided to transfer. Patient is very interested in the adherence packaging, she would still like to pick up medications but would like to hold on complete onboard until she follow ups with PCP with for any medication or vitamin changes. She is considering placing vitamins in packages also. Acute fill form placed for Azelastine 0.05% drops for pick up from patient on 10/15/2020. Patient aware the pharmacy will call when ready for pick up.  Called [facility] to have medication refills of Azelastine 0.05% drops sent to [facility]. Leata Mouse, CPP notified.  Pattricia Boss, [place] Pharmacist Assistant [phone]

## 2020-10-08 ENCOUNTER — Encounter: Payer: Self-pay | Admitting: Nurse Practitioner

## 2020-10-08 ENCOUNTER — Ambulatory Visit: Payer: Medicare HMO | Admitting: Nurse Practitioner

## 2020-10-14 ENCOUNTER — Other Ambulatory Visit: Payer: Self-pay

## 2020-10-14 ENCOUNTER — Ambulatory Visit
Admission: RE | Admit: 2020-10-14 | Discharge: 2020-10-14 | Disposition: A | Discharge status: Home / Self Care | Payer: Medicare HMO | Source: Ambulatory Visit | Attending: Nurse Practitioner | Admitting: Nurse Practitioner

## 2020-10-14 DIAGNOSIS — Z780 Asymptomatic menopausal state: Secondary | ICD-10-CM | POA: Diagnosis not present

## 2020-10-14 DIAGNOSIS — E2839 Other primary ovarian failure: Secondary | ICD-10-CM

## 2020-10-15 ENCOUNTER — Other Ambulatory Visit: Payer: Self-pay

## 2020-10-15 MED ORDER — AZELASTINE HCL 0.05 % OP SOLN: 1.0000 [drp] | Freq: Two times a day (BID) | OPHTHALMIC | 3 refills | Status: DC

## 2020-10-21 DIAGNOSIS — R69 Illness, unspecified: Secondary | ICD-10-CM | POA: Diagnosis not present

## 2020-12-05 DIAGNOSIS — H25813 Combined forms of age-related cataract, bilateral: Secondary | ICD-10-CM | POA: Diagnosis not present

## 2020-12-05 DIAGNOSIS — H1045 Other chronic allergic conjunctivitis: Secondary | ICD-10-CM | POA: Diagnosis not present

## 2020-12-05 DIAGNOSIS — R7309 Other abnormal glucose: Secondary | ICD-10-CM | POA: Diagnosis not present

## 2020-12-05 DIAGNOSIS — H0288A Meibomian gland dysfunction right eye, upper and lower eyelids: Secondary | ICD-10-CM | POA: Diagnosis not present

## 2020-12-05 DIAGNOSIS — H5203 Hypermetropia, bilateral: Secondary | ICD-10-CM | POA: Diagnosis not present

## 2020-12-05 DIAGNOSIS — H0288B Meibomian gland dysfunction left eye, upper and lower eyelids: Secondary | ICD-10-CM | POA: Diagnosis not present

## 2020-12-05 LAB — HM DIABETES EYE EXAM

## 2020-12-09 ENCOUNTER — Encounter: Payer: Self-pay | Admitting: Nurse Practitioner

## 2020-12-25 ENCOUNTER — Other Ambulatory Visit: Payer: Self-pay | Admitting: Nurse Practitioner

## 2020-12-27 ENCOUNTER — Telehealth: Payer: Medicare HMO

## 2021-01-08 ENCOUNTER — Telehealth: Payer: Medicare HMO

## 2021-01-08 ENCOUNTER — Other Ambulatory Visit: Payer: Self-pay

## 2021-01-08 ENCOUNTER — Telehealth: Payer: Self-pay

## 2021-01-08 MED ORDER — ACCU-CHEK SOFTCLIX LANCETS MISC: 2 refills | Status: DC

## 2021-01-08 NOTE — Chronic Care Management (AMB) (Signed)
Chronic Care Management Pharmacy Assistant   Name: Nancy [name]  MRN: [id number] DOB: 1951-09-18  Reason for Encounter: Disease State - General Adherence Call.  Patient Questions:  1.  Have you seen any other providers since your last visit? No   2.  Any changes in your medicines or health? No .  PCP : Nancy Brine, FNP  Allergies:  No Known Allergies  Medications: Outpatient Encounter Medications as of 01/08/2021  Medication Sig   amoxicillin (AMOXIL) 875 MG tablet Take 1 tablet (875 mg total) by mouth 2 (two) times daily.   atorvastatin (LIPITOR) 20 MG tablet TAKE 1 TABLET BY MOUTH EVERY NIGHT FOR CHOLESTEROL   azelastine (OPTIVAR) 0.05 % ophthalmic solution Place 1 drop into both eyes 2 (two) times daily.   Blood Glucose Monitoring Suppl (ACCU-CHEK GUIDE) w/Device KIT 1 Device by Does not apply route daily.   Cholecalciferol 50 MCG (2000 UT) TABS Take 1 tablet by mouth daily.   Glucosamine HCl 1500 MG TABS Take 1 tablet by mouth daily.   glucose blood (ACCU-CHEK GUIDE) test strip Use as instructed   Lactobacillus (PROBIOTIC ACIDOPHILUS PO) Take 1 tablet by mouth daily.   levothyroxine (SYNTHROID) 150 MCG tablet TAKE 2 TABLETS ON WED AND SATURDAY AND ONE TABLET ONCE A DAY THE OTHER DAYS (Patient taking differently: Take 150 mcg by mouth daily before breakfast. )   linaclotide (LINZESS) 72 MCG capsule Take 1 capsule (72 mcg total) by mouth daily before breakfast. (Patient not taking: Reported on 09/27/2020)   Magnesium 250 MG TABS Take 1 tablet by mouth daily.   Multiple Vitamins-Minerals (MULTIVITAMIN WOMEN 50+) TABS Take 1 tablet by mouth daily.   omeprazole (PRILOSEC) 20 MG capsule Take 1 capsule (20 mg total) by mouth daily.   OVER THE COUNTER MEDICATION Take 1 tablet by mouth daily. Saffron extract 54m   spironolactone (ALDACTONE) 50 MG tablet TAKE 1 TABLET BY MOUTH EVERY DAY   triamcinolone cream (KENALOG) 0.1 % APPLY A THIN LAYER TWICE A DAY AS NEEDED    Turmeric 500 MG TABS Take 2 tablets by mouth daily.   No facility-administered encounter medications on file as of 01/08/2021.    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 Per patient she is going to cancel appointment for today, because she needs to go to store to stock up on things before the next snow comes this week. I told her that someone from our office will be calling her to reschedule appointment. Voiced understanding .  Patient states she is doing good , she is taking all medications as directed by provider. Patient states she is watching her salts ,  eating small portions , and she is riding stationary bike 4 miles three times a week . [facility] DM checks . I told her that I would send message to Nancy [name] CPP , Patient voiced understanding,  Sent team message to Nancy [name]- about refill for Lancets .    Vallie [name],CPP Notified  Nancy [name] She[facility] FClinical Pharmacist  Assistant [phone]

## 2021-01-16 ENCOUNTER — Ambulatory Visit (INDEPENDENT_AMBULATORY_CARE_PROVIDER_SITE_OTHER): Payer: Medicare HMO

## 2021-01-16 ENCOUNTER — Encounter: Payer: Medicare HMO | Admitting: Nurse Practitioner

## 2021-01-16 VITALS — Ht 64.0 in | Wt 235.0 lb

## 2021-01-16 DIAGNOSIS — Z0000 Encounter for general adult medical examination without abnormal findings: Secondary | ICD-10-CM | POA: Diagnosis not present

## 2021-01-16 NOTE — Progress Notes (Addendum)
I connected with  Arti Trang today via telehealth video enabled device and verified that I am speaking with the correct person using two identifiers.   Location: Patient: home Provider: work  Persons participating in virtual visit: Nancy [name], Glenna Durand LPN  I discussed the limitations, risks, security and privacy concerns of performing an evaluation and management service by video and the availability of in person appointments. The patient expressed understanding and agreed to proceed.   Some vital signs may be absent or patient reported.       Subjective:   Nancy [name] is a 70 y.o. female who presents for Medicare Annual (Subsequent) preventive examination.  Review of Systems     Cardiac Risk Factors include: advanced age (>63mn, >>62women);dyslipidemia;hypertension;obesity (BMI >30kg/m2)     Objective:    Today's Vitals   01/16/21 1134  Weight: 235 lb (106.6 kg)  Height: 5' 4"  (1.626 m)   Body mass index is 40.34 kg/m.  Advanced Directives 01/16/2021 01/11/2020  Does Patient Have a Medical Advance Directive? No No  Would patient like information on creating a medical advance directive? - Yes (MAU/Ambulatory/Procedural Areas - Information given)    Current Medications (verified) Outpatient Encounter Medications as of 01/16/2021  Medication Sig  . Accu-Chek Softclix Lancets lancets Use to check blood sugars daily E11.69  . atorvastatin (LIPITOR) 20 MG tablet TAKE 1 TABLET BY MOUTH EVERY NIGHT FOR CHOLESTEROL  . azelastine (OPTIVAR) 0.05 % ophthalmic solution Place 1 drop into both eyes 2 (two) times daily.  . Blood Glucose Monitoring Suppl (ACCU-CHEK GUIDE) w/Device KIT 1 Device by Does not apply route daily.  . Cholecalciferol 50 MCG (2000 UT) TABS Take 1 tablet by mouth daily.  . Glucosamine HCl 1500 MG TABS Take 1 tablet by mouth daily.  .Marland Kitchenglucose blood (ACCU-CHEK GUIDE) test strip Use as instructed  . Lactobacillus (PROBIOTIC ACIDOPHILUS PO) Take 1 tablet  by mouth daily.  .Marland Kitchenlevothyroxine (SYNTHROID) 150 MCG tablet TAKE 2 TABLETS ON WED AND SATURDAY AND ONE TABLET ONCE A DAY THE OTHER DAYS (Patient taking differently: Take 150 mcg by mouth daily before breakfast.)  . Magnesium 250 MG TABS Take 1 tablet by mouth daily.  . Multiple Vitamins-Minerals (MULTIVITAMIN WOMEN 50+) TABS Take 1 tablet by mouth daily.  .Marland Kitchenspironolactone (ALDACTONE) 50 MG tablet TAKE 1 TABLET BY MOUTH EVERY DAY  . triamcinolone cream (KENALOG) 0.1 % APPLY A THIN LAYER TWICE A DAY AS NEEDED  . amoxicillin (AMOXIL) 875 MG tablet Take 1 tablet (875 mg total) by mouth 2 (two) times daily. (Patient not taking: Reported on 01/16/2021)  . linaclotide (LINZESS) 72 MCG capsule Take 1 capsule (72 mcg total) by mouth daily before breakfast. (Patient not taking: Reported on 01/16/2021)  . omeprazole (PRILOSEC) 20 MG capsule Take 1 capsule (20 mg total) by mouth daily. (Patient not taking: Reported on 01/16/2021)  . OVER THE COUNTER MEDICATION Take 1 tablet by mouth daily. Saffron extract 978m(Patient not taking: Reported on 01/16/2021)  . Turmeric 500 MG TABS Take 2 tablets by mouth daily. (Patient not taking: Reported on 01/16/2021)   No facility-administered encounter medications on file as of 01/16/2021.    Allergies (verified) Patient has no known allergies.   History: Past Medical History:  Diagnosis Date  . Hyperlipidemia   . Hypertension   . Prediabetes   . Thyroid disease    Past Surgical History:  Procedure Laterality Date  . BREAST SURGERY    . CHOLECYSTECTOMY    . REDUCTION MAMMAPLASTY    .  TONSILLECTOMY     Family History  Problem Relation Age of Onset  . Diabetes Mother   . Hypertension Mother   . Hypertension Father   . Heart disease Father   . Diabetes Sister   . Kidney failure Sister   . Diabetes Brother   . Kidney disease Brother   . Diabetes Sister   . Diabetes Sister   . Asthma Sister   . Cancer Maternal Grandfather   . Cancer Paternal Grandfather     Social History   Socioeconomic History  . Marital status: Unknown    Spouse name: Not on file  . Number of children: Not on file  . Years of education: Not on file  . Highest education level: Not on file  Occupational History  . Occupation: retired  Tobacco Use  . Smoking status: Former Research scientist (life sciences)  . Smokeless tobacco: Never Used  Vaping Use  . Vaping Use: Never used  Substance and Sexual Activity  . Alcohol use: Not Currently  . Drug use: Never  . Sexual activity: Not Currently  Other Topics Concern  . Not on file  Social History Narrative  . Not on file   Social Determinants of Health   Financial Resource Strain: Low Risk   . Difficulty of Paying Living Expenses: Not hard at all  Food Insecurity: No Food Insecurity  . Worried About Charity fundraiser in the Last Year: Never true  . Ran Out of Food in the Last Year: Never true  Transportation Needs: No Transportation Needs  . Lack of Transportation (Medical): No  . Lack of Transportation (Non-Medical): No  Physical Activity: Sufficiently Active  . Days of Exercise per Week: 7 days  . Minutes of Exercise per Session: 120 min  Stress: No Stress Concern Present  . Feeling of Stress : Not at all  Social Connections: Not on file    Tobacco Counseling Counseling given: Not Answered   Clinical Intake:  Pre-visit preparation completed: Yes  Pain : No/denies pain     Nutritional Status: BMI > 30  Obese Nutritional Risks: None Diabetes: No  How often do you need to have someone help you when you read instructions, pamphlets, or other written materials from your doctor or pharmacy?: 1 - Never What is the last grade level you completed in school?: masters degree  Diabetic? no  Interpreter Needed?: No  Information entered by :: NAllen LPN   Activities of Daily Living In your present state of health, do you have any difficulty performing the following activities: 01/16/2021  Hearing? N  Vision? N  Difficulty  concentrating or making decisions? N  Walking or climbing stairs? N  Dressing or bathing? N  Doing errands, shopping? N  Preparing Food and eating ? N  Using the Toilet? N  In the past six months, have you accidently leaked urine? N  Do you have problems with loss of bowel control? N  Managing your Medications? N  Managing your Finances? N  Housekeeping or managing your Housekeeping? N  Some recent data might be hidden    Patient Care Team: [name], FNP as PCP - General ([place]) Jerline Pain, MD as PCP - Cardiology (Cardiology) Mayford Knife, [facility] (Pharmacist)  Indicate any recent Medical Services you may have received from other than [facility] providers in the past year (date may be approximate).     Assessment:   This is a routine wellness examination for Nancy [name].  Hearing/Vision screen  Hearing Screening  125Hz  250Hz  500Hz  1000Hz  2000Hz  3000Hz  4000Hz  6000Hz  8000Hz   Right ear:           Left ear:           Vision Screening Comments: Regular eye exams, [facility]  Dietary issues and exercise activities discussed: Current Exercise Habits: Home exercise routine, Type of exercise: walking, Time (Minutes): > 60, Frequency (Times/Week): 7, Weekly Exercise (Minutes/Week): 0  Goals    . Patient Stated     01/11/2020, wants to get down to 160-170 pounds    . Patient Stated     01/16/2021, wants to continue losing weight    . Pharmacy Care Plan     CARE PLAN ENTRY (see longitudinal plan of care for additional care plan information)  Current Barriers:  . Chronic Disease Management support, education, and care coordination needs related to Hypertension, Hyperlipidemia, and Prediabetes   Hypertension BP Readings from Last 3 Encounters:  07/09/20 118/68  05/14/20 (!) 104/58  04/10/20 116/80   . Pharmacist Clinical Goal(s): o Over the next 180 days, patient will work with PharmD and providers to maintain BP goal <140/90 . Current regimen:   o Spironolactone 56m daily . Interventions: o Provided dietary and exercise recommendations o Advised patient she could take spironolactone in the morning - Determined patient has experienced problems with nighttime urination o Discussed that headaches can sometimes be a sign of high blood pressure . Patient self care activities - Over the next 180 days, patient will: o Check BP periodically and if symptomatic, document, and provide at future appointments o Ensure daily salt intake < 2300 mg/day o Continue current exercise routine and get at least 30 minutes of exercise 5 times weekly  Hyperlipidemia Lab Results  Component Value Date/Time   LDLCALC 87 07/09/2020 02:21 PM   . Pharmacist Clinical Goal(s): o Over the next 180 days, patient will work with PharmD and providers to maintain LDL goal < 100 . Current regimen:  o Atorvastatin 243mdaily . Interventions: o Discussed appropriate goals for LDL (less than 100), HDL (greater than 50), and triglycerides (less than 150) o Provided dietary and exercise recommendations . Patient self care activities - Over the next 180 days, patient will: o Continue current exercise routine and get at least 30 minutes of exercise 5 times weekly  Prediabetes Lab Results  Component Value Date/Time   HGBA1C 5.6 07/09/2020 02:21 PM   HGBA1C 5.7 (H) 04/10/2020 02:06 PM   . Pharmacist Clinical Goal(s): o Over the next 180 days, patient will work with PharmD and providers to maintain A1c goal  < 5.7% . Current regimen:  o N/A . Interventions: o Provided dietary and exercise recommendations o Discussed the appropriate amount of carbohydrates patient should eat with each meal (45-60 grams per meal, 15 grams per snack) o Discussed PLATE method (include information) o Recommend patient increase water intake to 64 ounces daily o Discussed the effects of exercise to lower Hemoglobin A1c (150 minutes weekly can lower A1c by 0.7%) . Patient self care  activities - Over the next 180 days, patient will: o Check blood sugar once daily, document, and provide at future appointments o Contact provider with any episodes of hypoglycemia o Continue current exercise routine and get at least 30 minutes of exercise 5 times weekly o Limit portion sizes of carbohydrates with each meal o Focus on eating well- balanced meals utilizing the PLATE method o Increase water intake to 64 ounces daily  Medication management . Pharmacist Clinical Goal(s): o  Over the next 180 days, patient will work with PharmD and providers to maintain optimal medication adherence . Current pharmacy: [facility] . Interventions o Comprehensive medication review performed. o Continue current medication management strategy o Discussed recommended vaccine (COVID booster, 2nd dose of Shingrix, and Prevnar13) and timing o Reviewed [employer] formulary for cheaper alternative to azelastine eye drops - Determined all antihistamine eye drops are Tier 3 or 4 o Discussed checking price of azelastine eye drops using coupon such as GoodRx, checking price without insurance, or trying over the counter allergy eye drops (olopatadine) o Advised patient she could check with eye doctor regarding alternatives to azelastine . Patient self care activities - Over the next 180 days, patient will: o Focus on medication adherence by continued [place] of pill box o Take medications as prescribed o Report any questions or concerns to PharmD and/or provider(s)  Initial goal documentation       Depression Screen PHQ 2/9 Scores 01/16/2021 04/10/2020 01/11/2020 01/11/2020 12/06/2019 09/25/2019  PHQ - 2 Score 0 0 0 0 0 0  PHQ- 9 Score - - 0 - - -    Fall Risk Fall Risk  01/16/2021 04/10/2020 01/11/2020 01/11/2020 12/06/2019  Falls in the past year? 0 0 1 0 0  Comment - - tripped - -  Number falls in past yr: - - - - -  Injury with Fall? - - - - -  Risk for fall due to : Medication side effect - History of fall(s)  - -  Follow up Education provided;Falls evaluation completed;Falls prevention discussed - Falls evaluation completed;Education provided;Falls prevention discussed - -    FALL RISK PREVENTION PERTAINING TO THE HOME:  Any stairs in or around the home? No  If so, are there any without handrails? n/a Home free of loose throw rugs in walkways, pet beds, electrical cords, etc? Yes  Adequate lighting in your home to reduce risk of falls? Yes   ASSISTIVE DEVICES UTILIZED TO PREVENT FALLS:  Life alert? No  Use of a cane, walker or w/c? No  Grab bars in the bathroom? No  Shower chair or bench in shower? No  Elevated toilet seat or a handicapped toilet? No   TIMED UP AND GO:  Was the test performed? No . .  Cognitive Function:     6CIT Screen 01/16/2021 01/11/2020  What Year? 0 points 0 points  What month? 0 points 0 points  What time? 0 points 0 points  Count back from 20 0 points 0 points  Months in reverse 0 points 0 points  Repeat phrase 2 points 0 points  Total Score 2 0    Immunizations Immunization History  Administered Date(s) Administered  . Influenza, High Dose Seasonal PF 08/13/2019  . Influenza,inj,Quad PF,6+ Mos 08/13/2019  . PFIZER(Purple Top)SARS-COV-2 Vaccination 02/16/2020, 03/13/2020, 10/03/2020  . Tdap 01/11/2020  . Zoster Recombinat (Shingrix) 08/29/2020    TDAP status: Up to date  Flu Vaccine status: Up to date  Pneumococcal vaccine status: Up to date  Covid-19 vaccine status: Completed vaccines  Qualifies for Shingles Vaccine? Yes   Zostavax completed No   Shingrix Completed?: Yes  Screening Tests Health Maintenance  Topic Date Due  . PNA vac Low Risk Adult (1 of 2 - PCV13) Never done  . INFLUENZA VACCINE  07/21/2020  . URINE MICROALBUMIN  09/24/2020  . HEMOGLOBIN A1C  01/09/2021  . FOOT EXAM  01/10/2021  . MAMMOGRAM  12/05/2021  . OPHTHALMOLOGY EXAM  12/05/2021  . COLONOSCOPY (Pts 45-36yr  Insurance coverage will need to be confirmed)   09/01/2028  . TETANUS/TDAP  01/10/2030  . DEXA SCAN  Completed  . COVID-19 Vaccine  Completed  . Hepatitis C Screening  Completed    Health Maintenance  Health Maintenance Due  Topic Date Due  . PNA vac Low Risk Adult (1 of 2 - PCV13) Never done  . INFLUENZA VACCINE  07/21/2020  . URINE MICROALBUMIN  09/24/2020  . HEMOGLOBIN A1C  01/09/2021  . FOOT EXAM  01/10/2021    Colorectal cancer screening: Type of screening: Colonoscopy. Completed 09/01/2018. Repeat every 5 years  Mammogram status: Completed 12/06/2019. Repeat every year  Bone Density status: Completed 10/14/2020. Results reflect: Bone density results: NORMAL. Repeat every 0 years.  Lung Cancer Screening: (Low Dose CT Chest recommended if Age 54-80 years, 30 pack-year currently smoking OR have quit w/in 15years.) does not qualify.   Lung Cancer Screening Referral: no  Additional Screening:  Hepatitis C Screening: does qualify; Completed 01/11/2020  Vision Screening: Recommended annual ophthalmology exams for early detection of glaucoma and other disorders of the eye. Is the patient up to date with their annual eye exam?  Yes  Who is the provider or what is the name of the office in which the patient attends annual eye exams? [facility] If pt is not established with a provider, would they like to be referred to a provider to establish care? No .   Dental Screening: Recommended annual dental exams for proper oral hygiene  Community Resource Referral / Chronic Care Management: CRR required this visit?  No   CCM required this visit?  No      Plan:     I have personally reviewed and noted the following in the patient's chart:   . Medical and social history . Use of alcohol, tobacco or illicit drugs  . Current medications and supplements . Functional ability and status . Nutritional status . Physical activity . Advanced directives . List of other physicians . Hospitalizations, surgeries, and ER  visits in previous 12 months . Vitals . Screenings to include cognitive, depression, and falls . Referrals and appointments  In addition, I have reviewed and discussed with patient certain preventive protocols, quality metrics, and best practice recommendations. A written personalized care plan for preventive services as well as general preventive health recommendations were provided to patient.     Kellie Simmering, LPN   [id number]   Nurse Notes:

## 2021-01-16 NOTE — Patient Instructions (Signed)
Nancy [name] , Thank you for taking time to come for your Medicare Wellness Visit. I appreciate your ongoing commitment to your health goals. Please review the following plan we discussed and let me know if I can assist you in the future.   Screening recommendations/referrals: Colonoscopy: completed 09/01/2018 Mammogram: completed 12/06/2019 Bone Density: completed 10/14/2020 Recommended yearly ophthalmology/optometry visit for glaucoma screening and checkup Recommended yearly dental visit for hygiene and checkup  Vaccinations: Influenza vaccine: completed per patient Pneumococcal vaccine: completed 10/21/2020 Tdap vaccine: completed 01/11/2020 Shingles vaccine: completed   Covid-19: 10/03/2020, 03/13/2020, 02/16/2020  Advanced directives: Advance directive discussed with you today.   Conditions/risks identified: none  Next appointment: Follow up in one year for your annual wellness visit    Preventive Care 65 Years and Older, Female Preventive care refers to lifestyle choices and visits with your health care provider that can promote health and wellness. What does preventive care include?  A yearly physical exam. This is also called an annual well check.  Dental exams once or twice a year.  Routine eye exams. Ask your health care provider how often you should have your eyes checked.  Personal lifestyle choices, including:  Daily care of your teeth and gums.  Regular physical activity.  Eating a healthy diet.  Avoiding tobacco and drug use.  Limiting alcohol use.  Practicing safe sex.  Taking low-dose aspirin every day.  Taking vitamin and mineral supplements as recommended by your health care provider. What happens during an annual well check? The services and screenings done by your health care provider during your annual well check will depend on your age, overall health, lifestyle risk factors, and family history of disease. Counseling  Your health care provider may  ask you questions about your:  Alcohol use.  Tobacco use.  Drug use.  Emotional well-being.  Home and relationship well-being.  Sexual activity.  Eating habits.  History of falls.  Memory and ability to understand (cognition).  Work and work Statistician.  Reproductive health. Screening  You may have the following tests or measurements:  Height, weight, and BMI.  Blood pressure.  Lipid and cholesterol levels. These may be checked every 5 years, or more frequently if you are over 64 years old.  Skin check.  Lung cancer screening. You may have this screening every year starting at age 44 if you have a 30-pack-year history of smoking and currently smoke or have quit within the past 15 years.  Fecal occult blood test (FOBT) of the stool. You may have this test every year starting at age 33.  Flexible sigmoidoscopy or colonoscopy. You may have a sigmoidoscopy every 5 years or a colonoscopy every 10 years starting at age 84.  Hepatitis C blood test.  Hepatitis B blood test.  Sexually transmitted disease (STD) testing.  Diabetes screening. This is done by checking your blood sugar (glucose) after you have not eaten for a while (fasting). You may have this done every 1-3 years.  Bone density scan. This is done to screen for osteoporosis. You may have this done starting at age 30.  Mammogram. This may be done every 1-2 years. Talk to your health care provider about how often you should have regular mammograms. Talk with your health care provider about your test results, treatment options, and if necessary, the need for more tests. Vaccines  Your health care provider may recommend certain vaccines, such as:  Influenza vaccine. This is recommended every year.  Tetanus, diphtheria, and acellular pertussis (Tdap, Td)  vaccine. You Harr[place] need a Td booster every 10 years.  Zoster vaccine. You [place] need this after age 4.  Pneumococcal 13-valent conjugate (PCV13) vaccine. One  dose is recommended after age 65.  Pneumococcal polysaccharide (PPSV23) vaccine. One dose is recommended after age 22. Talk to your health care provider about which screenings and vaccines you need and how often you need them. This information is not intended to replace advice given to you by your health care provider. Make sure you discuss any questions you have with your health care provider. Document Released: 01/03/2016 Document Revised: 08/26/2016 Document Reviewed: 10/08/2015 Elsevier Interactive Patient Education  2017 [place] Prevention in the Home Falls can cause injuries. They can happen to people of all ages. There are many things you can do to make your home safe and to help prevent falls. What can I do on the outside of my home?  Regularly fix the edges of walkways and driveways and fix any cracks.  Remove anything that might make you trip as you walk through a door, such as a raised step or threshold.  Trim any bushes or trees on the path to your home.  Use bright outdoor lighting.  Clear any walking paths of anything that might make someone trip, such as rocks or tools.  Regularly check to see if handrails are loose or broken. Make sure that both sides of any steps have handrails.  Any raised decks and porches should have guardrails on the edges.  Have any leaves, snow, or ice cleared regularly.  Use sand or salt on walking paths during winter.  Clean up any spills in your garage right away. This includes oil or grease spills. What can I do in the bathroom?  Use night lights.  Install grab bars by the toilet and in the tub and shower. Do not use towel bars as grab bars.  Use non-skid mats or decals in the tub or shower.  If you need to sit down in the shower, use a plastic, non-slip stool.  Keep the floor dry. Clean up any water that spills on the floor as soon as it happens.  Remove soap buildup in the tub or shower regularly.  Attach bath  mats securely with double-sided non-slip rug tape.  Do not have throw rugs and other things on the floor that can make you trip. What can I do in the bedroom?  Use night lights.  Make sure that you have a light by your bed that is easy to reach.  Do not use any sheets or blankets that are too big for your bed. They should not hang down onto the floor.  Have a firm chair that has side arms. You can use this for support while you get dressed.  Do not have throw rugs and other things on the floor that can make you trip. What can I do in the kitchen?  Clean up any spills right away.  Avoid walking on wet floors.  Keep items that you use a lot in easy-to-reach places.  If you need to reach something above you, use a strong step stool that has a grab bar.  Keep electrical cords out of the way.  Do not use floor polish or wax that makes floors slippery. If you must use wax, use non-skid floor wax.  Do not have throw rugs and other things on the floor that can make you trip. What can I do with my stairs?  Do not leave  any items on the stairs.  Make sure that there are handrails on both sides of the stairs and use them. Fix handrails that are broken or loose. Make sure that handrails are as long as the stairways.  Check any carpeting to make sure that it is firmly attached to the stairs. Fix any carpet that is loose or worn.  Avoid having throw rugs at the top or bottom of the stairs. If you do have throw rugs, attach them to the floor with carpet tape.  Make sure that you have a light switch at the top of the stairs and the bottom of the stairs. If you do not have them, ask someone to add them for you. What else can I do to help prevent falls?  Wear shoes that:  Do not have high heels.  Have rubber bottoms.  Are comfortable and fit you well.  Are closed at the toe. Do not wear sandals.  If you use a stepladder:  Make sure that it is fully opened. Do not climb a closed  stepladder.  Make sure that both sides of the stepladder are locked into place.  Ask someone to hold it for you, if possible.  Clearly mark and make sure that you can see:  Any grab bars or handrails.  First and last steps.  Where the edge of each step is.  Use tools that help you move around (mobility aids) if they are needed. These include:  Canes.  Walkers.  Scooters.  Crutches.  Turn on the lights when you go into a dark area. Replace any light bulbs as soon as they burn out.  Set up your furniture so you have a clear path. Avoid moving your furniture around.  If any of your floors are uneven, fix them.  If there are any pets around you, be aware of where they are.  Review your medicines with your doctor. Some medicines can make you feel dizzy. This can increase your chance of falling. Ask your doctor what other things that you can do to help prevent falls. This information is not intended to replace advice given to you by your health care provider. Make sure you discuss any questions you have with your health care provider. Document Released: 10/03/2009 Document Revised: 05/14/2016 Document Reviewed: 01/11/2015 Elsevier Interactive Patient Education  2017 [employer].

## 2021-01-22 ENCOUNTER — Telehealth: Payer: Self-pay

## 2021-01-22 ENCOUNTER — Encounter: Payer: Medicare HMO | Admitting: Nurse Practitioner

## 2021-01-22 NOTE — Telephone Encounter (Signed)
Pt has an upcoming physical with Rama she would like the orders for blood work to be put in so she can go have her blood work done a couple days before her physical.

## 2021-02-06 NOTE — Telephone Encounter (Signed)
That is fine to place the labs in advance, CMP, CBC without diff, lipid, HgbA1c, TSH, T3, Free T4

## 2021-02-07 ENCOUNTER — Other Ambulatory Visit: Payer: Self-pay

## 2021-02-07 DIAGNOSIS — E039 Hypothyroidism, unspecified: Secondary | ICD-10-CM

## 2021-02-07 DIAGNOSIS — R7303 Prediabetes: Secondary | ICD-10-CM

## 2021-02-07 DIAGNOSIS — E782 Mixed hyperlipidemia: Secondary | ICD-10-CM

## 2021-02-07 DIAGNOSIS — I10 Essential (primary) hypertension: Secondary | ICD-10-CM

## 2021-02-07 DIAGNOSIS — Z0000 Encounter for general adult medical examination without abnormal findings: Secondary | ICD-10-CM

## 2021-02-12 ENCOUNTER — Ambulatory Visit (INDEPENDENT_AMBULATORY_CARE_PROVIDER_SITE_OTHER): Payer: Medicare HMO | Admitting: Nurse Practitioner

## 2021-02-12 ENCOUNTER — Other Ambulatory Visit: Payer: Self-pay

## 2021-02-12 ENCOUNTER — Encounter: Payer: Self-pay | Admitting: Nurse Practitioner

## 2021-02-12 VITALS — BP 128/72 | HR 60 | Temp 98.2°F | Ht 65.4 in | Wt 238.2 lb

## 2021-02-12 DIAGNOSIS — Z1159 Encounter for screening for other viral diseases: Secondary | ICD-10-CM | POA: Diagnosis not present

## 2021-02-12 DIAGNOSIS — E039 Hypothyroidism, unspecified: Secondary | ICD-10-CM

## 2021-02-12 DIAGNOSIS — I10 Essential (primary) hypertension: Secondary | ICD-10-CM

## 2021-02-12 DIAGNOSIS — R7303 Prediabetes: Secondary | ICD-10-CM

## 2021-02-12 DIAGNOSIS — E66812 Obesity, class 2: Secondary | ICD-10-CM

## 2021-02-12 DIAGNOSIS — H6121 Impacted cerumen, right ear: Secondary | ICD-10-CM | POA: Diagnosis not present

## 2021-02-12 DIAGNOSIS — L409 Psoriasis, unspecified: Secondary | ICD-10-CM | POA: Diagnosis not present

## 2021-02-12 DIAGNOSIS — Z6839 Body mass index (BMI) 39.0-39.9, adult: Secondary | ICD-10-CM

## 2021-02-12 DIAGNOSIS — E782 Mixed hyperlipidemia: Secondary | ICD-10-CM

## 2021-02-12 DIAGNOSIS — Z0000 Encounter for general adult medical examination without abnormal findings: Secondary | ICD-10-CM

## 2021-02-12 DIAGNOSIS — Z87898 Personal history of other specified conditions: Secondary | ICD-10-CM

## 2021-02-12 LAB — POCT URINALYSIS DIPSTICK
Bilirubin, UA: NEGATIVE
Blood, UA: NEGATIVE
Glucose, UA: NEGATIVE
Ketones, UA: NEGATIVE
Leukocytes, UA: NEGATIVE
Nitrite, UA: NEGATIVE
Protein, UA: NEGATIVE
Spec Grav, UA: >=1.03 — AB (ref 1.010–1.025)
Urobilinogen, UA: 0.2 E.U./dL
pH, UA: 5.5 (ref 5.0–8.0)

## 2021-02-12 LAB — POCT UA - MICROALBUMIN
Albumin/Creatinine Ratio, Urine, POC: 30
Creatinine, POC: 200 mg/dL
Microalbumin Ur, POC: 10 mg/L

## 2021-02-12 NOTE — Patient Instructions (Signed)
Health Maintenance, Female Adopting a healthy lifestyle and getting preventive care are important in promoting health and wellness. Ask your health care provider about:  The right schedule for you to have regular tests and exams.  Things you can do on your own to prevent diseases and keep yourself healthy. What should I know about diet, weight, and exercise? Eat a healthy diet  Eat a diet that includes plenty of vegetables, fruits, low-fat dairy products, and lean protein.  Do not eat a lot of foods that are high in solid fats, added sugars, or sodium.   Maintain a healthy weight Body mass index (BMI) is used to identify weight problems. It estimates body fat based on height and weight. Your health care provider can help determine your BMI and help you achieve or maintain a healthy weight. Get regular exercise Get regular exercise. This is one of the most important things you can do for your health. Most adults should:  Exercise for at least 150 minutes each week. The exercise should increase your heart rate and make you sweat (moderate-intensity exercise).  Do strengthening exercises at least twice a week. This is in addition to the moderate-intensity exercise.  Spend less time sitting. Even light physical activity can be beneficial. Watch cholesterol and blood lipids Have your blood tested for lipids and cholesterol at 70 years of age, then have this test every 5 years. Have your cholesterol levels checked more often if:  Your lipid or cholesterol levels are high.  You are older than 70 years of age.  You are at high risk for heart disease. What should I know about cancer screening? Depending on your health history and family history, you may need to have cancer screening at various ages. This may include screening for:  Breast cancer.  Cervical cancer.  Colorectal cancer.  Skin cancer.  Lung cancer. What should I know about heart disease, diabetes, and high blood  pressure? Blood pressure and heart disease  High blood pressure causes heart disease and increases the risk of stroke. This is more likely to develop in people who have high blood pressure readings, are of African descent, or are overweight.  Have your blood pressure checked: ? Every 3-5 years if you are 18-39 years of age. ? Every year if you are 40 years old or older. Diabetes Have regular diabetes screenings. This checks your fasting blood sugar level. Have the screening done:  Once every three years after age 40 if you are at a normal weight and have a low risk for diabetes.  More often and at a younger age if you are overweight or have a high risk for diabetes. What should I know about preventing infection? Hepatitis B If you have a higher risk for hepatitis B, you should be screened for this virus. Talk with your health care provider to find out if you are at risk for hepatitis B infection. Hepatitis C Testing is recommended for:  Everyone born from 1945 through 1965.  Anyone with known risk factors for hepatitis C. Sexually transmitted infections (STIs)  Get screened for STIs, including gonorrhea and chlamydia, if: ? You are sexually active and are younger than 70 years of age. ? You are older than 70 years of age and your health care provider tells you that you are at risk for this type of infection. ? Your sexual activity has changed since you were last screened, and you are at increased risk for chlamydia or gonorrhea. Ask your health care provider   if you are at risk.  Ask your health care provider about whether you are at high risk for HIV. Your health care provider may recommend a prescription medicine to help prevent HIV infection. If you choose to take medicine to prevent HIV, you should first get tested for HIV. You should then be tested every 3 months for as long as you are taking the medicine. Pregnancy  If you are about to stop having your period (premenopausal) and  you may become pregnant, seek counseling before you get pregnant.  Take 400 to 800 micrograms (mcg) of folic acid every day if you become pregnant.  Ask for birth control (contraception) if you want to prevent pregnancy. Osteoporosis and menopause Osteoporosis is a disease in which the bones lose minerals and strength with aging. This can result in bone fractures. If you are 65 years old or older, or if you are at risk for osteoporosis and fractures, ask your health care provider if you should:  Be screened for bone loss.  Take a calcium or vitamin D supplement to lower your risk of fractures.  Be given hormone replacement therapy (HRT) to treat symptoms of menopause. Follow these instructions at home: Lifestyle  Do not use any products that contain nicotine or tobacco, such as cigarettes, e-cigarettes, and chewing tobacco. If you need help quitting, ask your health care provider.  Do not use street drugs.  Do not share needles.  Ask your health care provider for help if you need support or information about quitting drugs. Alcohol use  Do not drink alcohol if: ? Your health care provider tells you not to drink. ? You are pregnant, may be pregnant, or are planning to become pregnant.  If you drink alcohol: ? Limit how much you use to 0-1 drink a day. ? Limit intake if you are breastfeeding.  Be aware of how much alcohol is in your drink. In the [place] one drink equals one 12 oz bottle of beer (355 mL), one 5 oz glass of wine (148 mL), or one 1 oz glass of hard liquor (44 mL). General instructions  Schedule regular health, dental, and eye exams.  Stay current with your vaccines.  Tell your health care provider if: ? You often feel depressed. ? You have ever been abused or do not feel safe at home. Summary  Adopting a healthy lifestyle and getting preventive care are important in promoting health and wellness.  Follow your health care provider's instructions about healthy  diet, exercising, and getting tested or screened for diseases.  Follow your health care provider's instructions on monitoring your cholesterol and blood pressure. This information is not intended to replace advice given to you by your health care provider. Make sure you discuss any questions you have with your health care provider. Document Revised: 11/30/2018 Document Reviewed: 11/30/2018 Elsevier Patient Education  2021 [employer]  

## 2021-02-12 NOTE — Progress Notes (Signed)
Rutherford Nail as a Education administrator for [employer], NP.,have documented all relevant documentation on the behalf of [employer], NP,as directed by  Bary Castilla, NP while in the presence of Bary Castilla, NP. This visit occurred during the SARS-CoV-2 public health emergency.  Safety protocols were in place, including screening questions prior to the visit, additional usage of staff PPE, and extensive cleaning of exam room while observing appropriate contact time as indicated for disinfecting solutions.  Subjective:     Patient ID: Nancy [name] , female    DOB: November 07, 1951 , 70 y.o.   MRN: [id number]   Chief Complaint  Patient presents with  . Annual Exam    HPI  Pt is here today for her Health Maintenace exam. She states that she is compliant with medications. She has no concerns at this time.   Wt Readings from Last 3 Encounters: 02/12/21 : 238 lb 3.2 oz (108 kg) 01/16/21 : 235 lb (106.6 kg) 07/09/20 : 221 lb (100.2 kg)     Past Medical History:  Diagnosis Date  . Hyperlipidemia   . Hypertension   . Prediabetes   . Thyroid disease      Family History  Problem Relation Age of Onset  . Diabetes Mother   . Hypertension Mother   . Hypertension Father   . Heart disease Father   . Diabetes Sister   . Kidney failure Sister   . Diabetes Brother   . Kidney disease Brother   . Diabetes Sister   . Diabetes Sister   . Asthma Sister   . Cancer Maternal Grandfather   . Cancer Paternal Grandfather      Current Outpatient Medications:  .  Accu-Chek Softclix Lancets lancets, Use to check blood sugars daily E11.69, Disp: 100 each, Rfl: 2 .  atorvastatin (LIPITOR) 20 MG tablet, TAKE 1 TABLET BY MOUTH EVERY NIGHT FOR CHOLESTEROL, Disp: 90 tablet, Rfl: 0 .  azelastine (OPTIVAR) 0.05 % ophthalmic solution, Place 1 drop into both eyes 2 (two) times daily., Disp: 6 mL, Rfl: 3 .  Blood Glucose Monitoring Suppl (ACCU-CHEK GUIDE) w/Device KIT, 1 Device by Does not  apply route daily., Disp: 1 kit, Rfl: 0 .  Cholecalciferol 50 MCG (2000 UT) TABS, Take 1 tablet by mouth daily., Disp: , Rfl:  .  Glucosamine HCl 1500 MG TABS, Take 1 tablet by mouth daily., Disp: , Rfl:  .  glucose blood (ACCU-CHEK GUIDE) test strip, Use as instructed, Disp: 100 each, Rfl: 12 .  Lactobacillus (PROBIOTIC ACIDOPHILUS PO), Take 1 tablet by mouth daily., Disp: , Rfl:  .  levothyroxine (SYNTHROID) 150 MCG tablet, TAKE 2 TABLETS ON WED AND SATURDAY AND ONE TABLET ONCE A DAY THE OTHER DAYS (Patient taking differently: Take 150 mcg by mouth daily before breakfast.), Disp: 180 tablet, Rfl: 0 .  linaclotide (LINZESS) 72 MCG capsule, Take 1 capsule (72 mcg total) by mouth daily before breakfast., Disp: 90 capsule, Rfl: 1 .  Magnesium 250 MG TABS, Take 1 tablet by mouth daily., Disp: , Rfl:  .  Multiple Vitamins-Minerals (MULTIVITAMIN WOMEN 50+) TABS, Take 1 tablet by mouth daily., Disp: , Rfl:  .  spironolactone (ALDACTONE) 50 MG tablet, TAKE 1 TABLET BY MOUTH EVERY DAY, Disp: 90 tablet, Rfl: 1 .  triamcinolone cream (KENALOG) 0.1 %, APPLY A THIN LAYER TWICE A DAY AS NEEDED, Disp: 60 g, Rfl: 0   No Known Allergies    The patient states she uses nothing  for birth control. Patient is post menopausal .  Negative for: breast discharge, breast lump(s), breast pain and breast self exam. Associated symptoms include abnormal vaginal bleeding. Pertinent negatives include abnormal bleeding (hematology), anxiety, decreased libido, depression, difficulty falling sleep, dyspareunia, history of infertility, nocturia, sexual dysfunction, sleep disturbances, urinary incontinence, urinary urgency, vaginal discharge and vaginal itching. Diet regular.The patient states her exercise level is    . The patient's tobacco use is:  Social History   Tobacco Use  Smoking Status Former Smoker  Smokeless Tobacco Never Used  . She has been exposed to passive smoke. The patient's alcohol use is:  Social History    Substance and Sexual Activity  Alcohol Use Not Currently  . Additional information: patient over 65    Review of Systems  Constitutional: Negative.  Negative for appetite change and fatigue.  HENT: Negative.  Negative for ear pain, facial swelling, sinus pressure and sore throat.   Eyes: Negative for redness and itching.  Respiratory: Negative for cough, chest tightness and shortness of breath.   Cardiovascular: Negative for chest pain.  Gastrointestinal: Negative for abdominal pain, diarrhea, nausea and vomiting.  Endocrine: Negative for polydipsia, polyphagia and polyuria.  Musculoskeletal: Negative.  Negative for arthralgias and myalgias.  Skin: Negative.   Neurological: Negative for dizziness, tremors, weakness and headaches.  Psychiatric/Behavioral: Negative.      Today's Vitals   02/12/21 1136  BP: 128/72  Pulse: 60  Temp: 98.2 F (36.8 C)  TempSrc: Oral  Weight: 238 lb 3.2 oz (108 kg)  Height: 5' 5.4" (1.661 m)   Body mass index is 39.16 kg/m.  Wt Readings from Last 3 Encounters:  02/12/21 238 lb 3.2 oz (108 kg)  01/16/21 235 lb (106.6 kg)  07/09/20 221 lb (100.2 kg)    Objective:  Physical Exam Vitals reviewed. Chaperone present: Patient refused   Constitutional:      General: She is not in acute distress.    Appearance: Normal appearance. She is obese.  HENT:     Head: Normocephalic.     Right Ear: Tympanic membrane, ear canal and external ear normal. There is no impacted cerumen.     Left Ear: Tympanic membrane, ear canal and external ear normal. There is no impacted cerumen.     Nose: Nose normal. No congestion.     Mouth/Throat:     Mouth: Mucous membranes are moist.  Eyes:     Extraocular Movements: Extraocular movements intact.     Conjunctiva/sclera: Conjunctivae normal.     Pupils: Pupils are equal, round, and reactive to light.  Cardiovascular:     Rate and Rhythm: Normal rate and regular rhythm.     Pulses: Normal pulses.     Heart sounds:  Normal heart sounds. No murmur heard.   Pulmonary:     Effort: Pulmonary effort is normal. No respiratory distress.     Breath sounds: Normal breath sounds. No wheezing.  Chest:  Breasts:     Tanner Score is 5.     Abdominal:     General: Bowel sounds are normal.     Tenderness: There is no abdominal tenderness.  Genitourinary:    Comments: Deferred  Musculoskeletal:        General: Normal range of motion.  Skin:    General: Skin is warm and dry.     Capillary Refill: Capillary refill takes less than 2 seconds.  Neurological:     General: No focal deficit present.     Mental Status: She is alert and oriented to person, place, and time.  Cranial Nerves: No cranial nerve deficit.  Psychiatric:        Mood and Affect: Mood normal.        Behavior: Behavior normal.        Thought Content: Thought content normal.        Judgment: Judgment normal.         Assessment And Plan:     1. Routine general medical examination at health care facility The annual wellness visit was performed including discussion of immunization, screenings and vitamins.   2. Essential hypertension - POCT Urinalysis Dipstick (81002) - POCT UA - Microalbumin - EKG 12-Lead -Patient has a appt with cardiologist March 8, 22.   3. Acquired hypothyroidism -TSH  -T4, free -T3 -Continue taking levothyroxine until we get labs   4. Mixed hyperlipidemia -Lipid panel  -Continue taking atorvastatin 20 mg once daily   5. Prediabetes -Hgb A1c   6. Psoriasis - Ambulatory referral to Dermatology -Continue using kenalog 0.1%   7. Impacted cerumen of right ear -Used curette   8. History of weight loss - Ambulatory referral to Plastic Surgery for panniculectomy    9. Encounter for hepatitis C screening test for low risk patient - Hepatitis C antibody  10. Class 2 severe obesity due to excess calories with serious comorbidity and body mass index (BMI) of 39.0 to 39.9 in adult [facility])  Staying  healthy and adopting a healthy lifestyle for your overall health is important. You should eat 7 or more servings of fruits and vegetables per day. You should drink plenty of water to keep yourself hydrated and your kidneys healthy. This includes about 65-80+ fluid ounces of water. Limit your intake of animal fats especially for elevated cholesterol. Avoid highly processed food and limit your salt intake if you have hypertension. Avoid foods high in saturated/Trans fats. Along with a healthy diet it is also very important to maintain time for yourself to maintain a healthy mental health with low stress levels. You should get atleast 150 min of moderate intensity exercise weekly for a healthy heart. Along with eating right and exercising, aim for at least 7-9 hours of sleep daily.  Eat more whole grains which includes barley, wheat berries, oats, brown rice and whole wheat pasta. Use healthy plant oils which include olive, soy, corn, sunflower and peanut. Limit your caffeine and sugary drinks. Limit your intake of fast foods. Limit milk and dairy products to one or two daily servings.   Patient was given opportunity to ask questions. Patient verbalized understanding of the plan and was able to repeat key elements of the plan. All questions were answered to their satisfaction.   Bary Castilla, NP   I, Bary Castilla, NP, have reviewed all documentation for this visit. The documentation on 02/12/21 for the exam, diagnosis, procedures, and orders are all accurate and complete.   THE PATIENT IS ENCOURAGED TO PRACTICE SOCIAL DISTANCING DUE TO THE COVID-19 PANDEMIC.

## 2021-02-13 LAB — CMP14+EGFR
ALT: 14 IU/L (ref 0–32)
AST: 23 IU/L (ref 0–40)
Albumin/Globulin Ratio: 1.7 (ref 1.2–2.2)
Albumin: 4.6 g/dL (ref 3.8–4.8)
Alkaline Phosphatase: 69 IU/L (ref 44–121)
BUN/Creatinine Ratio: 19 (ref 12–28)
BUN: 21 mg/dL (ref 8–27)
Bilirubin Total: 0.6 mg/dL (ref 0.0–1.2)
CO2: 22 mmol/L (ref 20–29)
Calcium: 9.7 mg/dL (ref 8.7–10.3)
Chloride: 102 mmol/L (ref 96–106)
Creatinine, Ser: 1.11 mg/dL — ABNORMAL HIGH (ref 0.57–1.00)
GFR calc Af Amer: 59 mL/min/{1.73_m2} — ABNORMAL LOW (ref >59)
GFR calc non Af Amer: 51 mL/min/{1.73_m2} — ABNORMAL LOW (ref >59)
Globulin, Total: 2.7 g/dL (ref 1.5–4.5)
Glucose: 76 mg/dL (ref 65–99)
Potassium: 4.1 mmol/L (ref 3.5–5.2)
Sodium: 142 mmol/L (ref 134–144)
Total Protein: 7.3 g/dL (ref 6.0–8.5)

## 2021-02-13 LAB — T4, FREE: Free T4: 1.4 ng/dL (ref 0.82–1.77)

## 2021-02-13 LAB — CBC
Hematocrit: 44.8 % (ref 34.0–46.6)
Hemoglobin: 14.6 g/dL (ref 11.1–15.9)
MCH: 29.6 pg (ref 26.6–33.0)
MCHC: 32.6 g/dL (ref 31.5–35.7)
MCV: 91 fL (ref 79–97)
Platelets: 234 10*3/uL (ref 150–450)
RBC: 4.93 x10E6/uL (ref 3.77–5.28)
RDW: 13.7 % (ref 11.7–15.4)
WBC: 5.9 10*3/uL (ref 3.4–10.8)

## 2021-02-13 LAB — LIPID PANEL
Chol/HDL Ratio: 3.1 ratio (ref 0.0–4.4)
Cholesterol, Total: 216 mg/dL — ABNORMAL HIGH (ref 100–199)
HDL: 69 mg/dL (ref >39)
LDL Chol Calc (NIH): 133 mg/dL — ABNORMAL HIGH (ref 0–99)
Triglycerides: 79 mg/dL (ref 0–149)
VLDL Cholesterol Cal: 14 mg/dL (ref 5–40)

## 2021-02-13 LAB — HEPATITIS C ANTIBODY: Hep C Virus Ab: <0.1 s/co ratio (ref 0.0–0.9)

## 2021-02-13 LAB — T3: T3, Total: 64 ng/dL — ABNORMAL LOW (ref 71–180)

## 2021-02-13 LAB — TSH: TSH: 4.61 u[IU]/mL — ABNORMAL HIGH (ref 0.450–4.500)

## 2021-02-13 LAB — HEMOGLOBIN A1C
Est. average glucose Bld gHb Est-mCnc: 114 mg/dL
Hgb A1c MFr Bld: 5.6 % (ref 4.8–5.6)

## 2021-02-18 ENCOUNTER — Other Ambulatory Visit: Payer: Self-pay | Admitting: Nurse Practitioner

## 2021-02-18 DIAGNOSIS — I10 Essential (primary) hypertension: Secondary | ICD-10-CM

## 2021-02-25 ENCOUNTER — Other Ambulatory Visit: Payer: Self-pay

## 2021-02-25 ENCOUNTER — Ambulatory Visit (HOSPITAL_COMMUNITY): Payer: Medicare HMO | Attending: Cardiovascular Disease

## 2021-02-25 DIAGNOSIS — I359 Nonrheumatic aortic valve disorder, unspecified: Secondary | ICD-10-CM | POA: Insufficient documentation

## 2021-02-25 LAB — ECHOCARDIOGRAM COMPLETE
Area-P 1/2: 2.68 cm2
S' Lateral: 3.3 cm

## 2021-02-27 NOTE — Progress Notes (Signed)
I'd like her to come back in 4-6 weeks to get her TSH levels rechecked.

## 2021-03-03 ENCOUNTER — Other Ambulatory Visit: Payer: Self-pay | Admitting: Nurse Practitioner

## 2021-03-03 DIAGNOSIS — Z1231 Encounter for screening mammogram for malignant neoplasm of breast: Secondary | ICD-10-CM

## 2021-03-14 ENCOUNTER — Telehealth: Payer: Self-pay

## 2021-03-14 NOTE — Chronic Care Management (AMB) (Addendum)
    Chronic Care Management Pharmacy Assistant   Name: Desia Saban  MRN: [id number] DOB: [birth year]   Reason for Encounter: General Adherence Call   Recent office visits:  02/12/21-Ghumman, Ramandeep, NP (OV).  Recent consult visits:  None  Hospital visits:  None in previous 6 months  Medications: Outpatient Encounter Medications as of 03/14/2021  Medication Sig   Accu-Chek Softclix Lancets lancets Use to check blood sugars daily E11.69   atorvastatin (LIPITOR) 20 MG tablet TAKE 1 TABLET BY MOUTH EVERY NIGHT FOR CHOLESTEROL   azelastine (OPTIVAR) 0.05 % ophthalmic solution Place 1 drop into both eyes 2 (two) times daily.   Blood Glucose Monitoring Suppl (ACCU-CHEK GUIDE) w/Device KIT 1 Device by Does not apply route daily.   Cholecalciferol 50 MCG (2000 UT) TABS Take 1 tablet by mouth daily.   Glucosamine HCl 1500 MG TABS Take 1 tablet by mouth daily.   glucose blood (ACCU-CHEK GUIDE) test strip Use as instructed   Lactobacillus (PROBIOTIC ACIDOPHILUS PO) Take 1 tablet by mouth daily.   levothyroxine (SYNTHROID) 150 MCG tablet TAKE 2 TABLETS ON WED AND SATURDAY AND ONE TABLET ONCE A DAY THE OTHER DAYS (Patient taking differently: Take 150 mcg by mouth daily before breakfast.)   linaclotide (LINZESS) 72 MCG capsule Take 1 capsule (72 mcg total) by mouth daily before breakfast.   Magnesium 250 MG TABS Take 1 tablet by mouth daily.   Multiple Vitamins-Minerals (MULTIVITAMIN WOMEN 50+) TABS Take 1 tablet by mouth daily.   spironolactone (ALDACTONE) 50 MG tablet TAKE 1 TABLET BY MOUTH EVERY DAY   triamcinolone cream (KENALOG) 0.1 % APPLY A THIN LAYER TWICE A DAY AS NEEDED   No facility-administered encounter medications on file as of 03/14/2021.     Note: Patient reports she took two thyroid medications by mistake today, one tablet was taken this early morning, second tablet was taken 3 hours later. Patient voiced she thought she was taking a different medication at the time and  realized it wasn't the right one.  Patient was informed not to take another dose tomorrow, I will reach out to Dr. Tawanna Sat nurse to make them aware today, and will notify Orlando Penner, CPP. I asked the patient how is she feeling and if she is experiencing an unusual difference. The patient voiced no she is doing fine. The Patient verbalized everything else is good. Patient was advised I will follow up with her for updates on any changes.  Patient stated she is due to come into the Dr. Tawanna Sat office soon to check TSH levels.   Star Rating Drugs: Atorvastatin 20 MG: 90 DS, last filled on 12/25/2020 at [place].  Orlando Penner, CPP Notified.  Raynelle Highland, [place] Pharmacist Assistant [phone] CCM Total Time: 60 Minutes  I have reviewed the care management and care coordination activities outlined in this encounter and I am certifying that I agree with the content of this note. I recommended that CPA contact PCP team, I also made sure to follow up with PCP team. No further action required.  Mayford Knife, [facility] 04/04/21 1:10 PM

## 2021-03-20 ENCOUNTER — Telehealth: Payer: Self-pay

## 2021-03-20 NOTE — Telephone Encounter (Signed)
LVM for pt to call the office for a LAB appt

## 2021-03-24 ENCOUNTER — Other Ambulatory Visit: Payer: Self-pay | Admitting: Nurse Practitioner

## 2021-03-28 ENCOUNTER — Other Ambulatory Visit: Payer: Self-pay | Admitting: Nurse Practitioner

## 2021-03-28 DIAGNOSIS — E039 Hypothyroidism, unspecified: Secondary | ICD-10-CM

## 2021-04-09 ENCOUNTER — Other Ambulatory Visit: Payer: Self-pay

## 2021-04-09 DIAGNOSIS — E039 Hypothyroidism, unspecified: Secondary | ICD-10-CM

## 2021-04-10 ENCOUNTER — Other Ambulatory Visit: Payer: Self-pay | Admitting: Nurse Practitioner

## 2021-04-10 DIAGNOSIS — E039 Hypothyroidism, unspecified: Secondary | ICD-10-CM | POA: Diagnosis not present

## 2021-04-11 LAB — TSH: TSH: 2.5 u[IU]/mL (ref 0.450–4.500)

## 2021-04-18 ENCOUNTER — Telehealth: Payer: Self-pay

## 2021-04-18 NOTE — Chronic Care Management (AMB) (Signed)
Chronic Care Management Pharmacy Assistant   Name: Brenee Gajda  MRN: [id number] DOB: Sep 22, 1951   Reason for Encounter: Disease State/ Hypertension and Hyperlipidemia     Recent office visits:  01-16-2021 Kellie Simmering, LPN (PCP)   [phone] Bary Castilla, NP (PCP)   Recent consult visits:  12-05-2020 Elayne Guerin (Optometry) Unable to view notes   Hospital visits:  None in previous 6 months  Medications: Outpatient Encounter Medications as of 04/18/2021  Medication Sig  . Accu-Chek Softclix Lancets lancets Use to check blood sugars daily E11.69  . atorvastatin (LIPITOR) 20 MG tablet TAKE 1 TABLET BY MOUTH EVERY NIGHT FOR CHOLESTEROL  . azelastine (OPTIVAR) 0.05 % ophthalmic solution Place 1 drop into both eyes 2 (two) times daily.  . Blood Glucose Monitoring Suppl (ACCU-CHEK GUIDE) w/Device KIT 1 Device by Does not apply route daily.  . Cholecalciferol 50 MCG (2000 UT) TABS Take 1 tablet by mouth daily.  . Glucosamine HCl 1500 MG TABS Take 1 tablet by mouth daily.  Marland Kitchen glucose blood (ACCU-CHEK GUIDE) test strip Use as instructed  . Lactobacillus (PROBIOTIC ACIDOPHILUS PO) Take 1 tablet by mouth daily.  Marland Kitchen levothyroxine (SYNTHROID) 150 MCG tablet TAKE 2 TABLETS ON WED AND SATURDAY AND ONE TABLET ONCE A DAY THE OTHER DAYS  . linaclotide (LINZESS) 72 MCG capsule Take 1 capsule (72 mcg total) by mouth daily before breakfast.  . Magnesium 250 MG TABS Take 1 tablet by mouth daily.  . Multiple Vitamins-Minerals (MULTIVITAMIN WOMEN 50+) TABS Take 1 tablet by mouth daily.  Marland Kitchen spironolactone (ALDACTONE) 50 MG tablet TAKE 1 TABLET BY MOUTH EVERY DAY  . triamcinolone cream (KENALOG) 0.1 % APPLY A THIN LAYER TWICE A DAY AS NEEDED   No facility-administered encounter medications on file as of 04/18/2021.   Reviewed chart prior to disease state call. Spoke with patient regarding BP  Recent Office Vitals: BP Readings from Last 3 Encounters:  02/12/21 128/72  07/09/20 118/68   05/14/20 (!) 104/58   Pulse Readings from Last 3 Encounters:  02/12/21 60  07/09/20 (!) 55  05/14/20 (!) 57    Wt Readings from Last 3 Encounters:  02/12/21 238 lb 3.2 oz (108 kg)  01/16/21 235 lb (106.6 kg)  07/09/20 221 lb (100.2 kg)     Kidney Function Lab Results  Component Value Date/Time   CREATININE 1.11 (H) 02/12/2021 02:12 PM   CREATININE 0.95 07/09/2020 02:21 PM   GFRNONAA 51 (L) 02/12/2021 02:12 PM   GFRAA 59 (L) 02/12/2021 02:12 PM    BMP Latest Ref Rng & Units 02/12/2021 07/09/2020 04/10/2020  Glucose 65 - 99 mg/dL 76 73 79  BUN 8 - 27 mg/dL _0 Creatinine 0.57 - 1.00 mg/dL 1.11(H) 0.95 1.06(H)  BUN/Creat Ratio 12 - _1 Sodium 134 - 144 mmol/L 142 141 143  Potassium 3.5 - 5.2 mmol/L 4.1 4.4 5.0  Chloride 96 - 106 mmol/L 102 104 105  CO2 20 - 29 mmol/L _2 Calcium 8.7 - 10.3 mg/dL 9.7 9.5 10.2    . Current antihypertensive regimen:  o Spironolactone 52m Daily  . How often are you checking your Blood Pressure? Patient states she doesn't check BP at home because she is need of a monitor.  . Current home BP readings: None  . What recent interventions/DTPs have been made by any provider to improve Blood Pressure control since last CPP Visit: Patent reports she is taking the spironolactone in the morning as  advised.  . Any recent hospitalizations or ED visits since last visit with CPP? No   . What diet changes have been made to improve Blood Pressure Control?  o Patient reports switching to a tofu diet and is managing well. Patient states she's decreased her salt intake.   . What exercise is being done to improve your Blood Pressure Control?  o Patient states that she exercises daily  Adherence Review: Is the patient currently on ACE/ARB medication? No Does the patient have >5 day gap between last estimated fill dates? No  04/18/2021 Name: Khylee Algeo MRN: [id number] DOB: 1951/01/21 Novi Calia is a 70 y.o. year old female who is a  primary care patient of [name], [place].  Comprehensive medication review performed; Spoke to patient regarding cholesterol  Lipid Panel    Component Value Date/Time   CHOL 216 (H) 02/12/2021 1412   TRIG 79 02/12/2021 1412   HDL 69 02/12/2021 1412   LDLCALC 133 (H) 02/12/2021 1412    10-year ASCVD risk score: The ASCVD Risk score Mikey Bussing DC Jr., et al., 2013) failed to calculate for the following reasons:   Unable to determine if patient is Non-Hispanic African American  . Current antihyperlipidemic regimen:  ? Atorvastatin 23m daily  . Previous antihyperlipidemic medications tried: None  . ASCVD risk enhancing conditions: age >>49and HTN   . What recent interventions/DTPs have been made by any provider to improve Cholesterol control since last CPP Visit: Patient states bettering her diet and exercising regularly.    . Any recent hospitalizations or ED visits since last visit with CPP? No   . What diet changes have been made to improve Cholesterol?  o Patient reports limiting foods high in fat  . What exercise is being done to improve Cholesterol?  o Patient states she exercises daily  Adherence Review: Does the patient have >5 day gap between last estimated fill dates? No  NOTES Sent scheduling a message to [name]. Patient is having trouble with transportation but circumstances may change before CCM appointment. If transportation is still an issue patient states she would like to discuss switching to [employer]  Star Rating Drugs: Atorvastatin 242m Last filled 03-24-2021 [facility]  [phone]

## 2021-04-23 ENCOUNTER — Other Ambulatory Visit: Payer: Self-pay

## 2021-04-23 ENCOUNTER — Ambulatory Visit
Admission: RE | Admit: 2021-04-23 | Discharge: 2021-04-23 | Disposition: A | Discharge status: Home / Self Care | Payer: Medicare HMO | Source: Ambulatory Visit | Attending: Nurse Practitioner | Admitting: Nurse Practitioner

## 2021-04-23 DIAGNOSIS — Z1231 Encounter for screening mammogram for malignant neoplasm of breast: Secondary | ICD-10-CM | POA: Diagnosis not present

## 2021-05-13 ENCOUNTER — Other Ambulatory Visit: Payer: Self-pay | Admitting: Nurse Practitioner

## 2021-05-20 ENCOUNTER — Other Ambulatory Visit: Payer: Self-pay

## 2021-05-20 ENCOUNTER — Ambulatory Visit (INDEPENDENT_AMBULATORY_CARE_PROVIDER_SITE_OTHER): Payer: Medicare HMO | Admitting: Plastic Surgery

## 2021-05-20 ENCOUNTER — Encounter: Payer: Self-pay | Admitting: Plastic Surgery

## 2021-05-20 DIAGNOSIS — M793 Panniculitis, unspecified: Secondary | ICD-10-CM | POA: Insufficient documentation

## 2021-05-20 HISTORY — DX: Panniculitis, unspecified: M79.3

## 2021-05-20 NOTE — Progress Notes (Signed)
Patient ID: Nancy [name], female    DOB: [birth year], 70 y.o.   MRN: [id number]   Chief Complaint  Patient presents with  . Advice Only    The patient is a 70 year old female here for evaluation of her abdomen.  She is 5 feet 5 inches tall and weighs 240 pounds.  She has lost over 50 pounds in the past couple of years.  She is interested in a panniculectomy.  She has frequent breakdowns of her skin around her abdominal area.  This is most likely related to the moisture and chafing of the movement.  Wounds are noted today that are superficial.  She also has evidence of the scarring related to the skin breakdown.  She complains of back pain.  She quit smoking over 30 years ago.  She had cholecystectomy a number of years ago and the scar is noted on the right side of her abdomen.  She tries creams and lotions to try to help with the skin breakdown but it is only temporary.  She has prediabetes, hyperlipidemia, hypertension and a very large abdominal hernia.  She does not have a Education officer, environmental.   Review of Systems  Constitutional: Positive for activity change. Negative for appetite change.  HENT: Negative.   Eyes: Negative.   Respiratory: Negative.  Negative for chest tightness and shortness of breath.   Cardiovascular: Negative.   Gastrointestinal: Positive for abdominal distention and abdominal pain.  Endocrine: Negative.   Genitourinary: Negative.   Musculoskeletal: Positive for back pain.  Skin: Positive for rash and wound.  Neurological: Negative.   Hematological: Negative.   Psychiatric/Behavioral: Negative.     Past Medical History:  Diagnosis Date  . Hyperlipidemia   . Hypertension   . Prediabetes   . Thyroid disease     Past Surgical History:  Procedure Laterality Date  . BREAST SURGERY    . CHOLECYSTECTOMY    . REDUCTION MAMMAPLASTY    . TONSILLECTOMY        Current Outpatient Medications:  .  Accu-Chek Softclix Lancets lancets, Use to check blood sugars daily  E11.69, Disp: 100 each, Rfl: 2 .  atorvastatin (LIPITOR) 20 MG tablet, TAKE 1 TABLET BY MOUTH EVERY NIGHT FOR CHOLESTEROL, Disp: 90 tablet, Rfl: 0 .  azelastine (OPTIVAR) 0.05 % ophthalmic solution, INSTILL ONE DROP IN EACH EYE TWICE DAILY, Disp: 6 mL, Rfl: 3 .  Blood Glucose Monitoring Suppl (ACCU-CHEK GUIDE) w/Device KIT, 1 Device by Does not apply route daily., Disp: 1 kit, Rfl: 0 .  Cholecalciferol 50 MCG (2000 UT) TABS, Take 1 tablet by mouth daily., Disp: , Rfl:  .  Glucosamine HCl 1500 MG TABS, Take 1 tablet by mouth daily., Disp: , Rfl:  .  glucose blood (ACCU-CHEK GUIDE) test strip, Use as instructed, Disp: 100 each, Rfl: 12 .  Lactobacillus (PROBIOTIC ACIDOPHILUS PO), Take 1 tablet by mouth daily., Disp: , Rfl:  .  levothyroxine (SYNTHROID) 150 MCG tablet, TAKE 2 TABLETS ON WED AND SATURDAY AND ONE TABLET ONCE A DAY THE OTHER DAYS, Disp: 115 tablet, Rfl: 1 .  Magnesium 250 MG TABS, Take 1 tablet by mouth daily., Disp: , Rfl:  .  Multiple Vitamins-Minerals (MULTIVITAMIN WOMEN 50+) TABS, Take 1 tablet by mouth daily., Disp: , Rfl:  .  spironolactone (ALDACTONE) 50 MG tablet, TAKE 1 TABLET BY MOUTH EVERY DAY, Disp: 90 tablet, Rfl: 1 .  triamcinolone cream (KENALOG) 0.1 %, APPLY A THIN LAYER TWICE A DAY AS NEEDED, Disp: 60 g, Rfl:  0   Objective:   Vitals:   05/20/21 1332  BP: 116/73  Pulse: 62  SpO2: 98%    Physical Exam Vitals and nursing note reviewed.  Constitutional:      Appearance: Normal appearance.  HENT:     Head: Normocephalic and atraumatic.  Eyes:     Pupils: Pupils are equal, round, and reactive to light.  Cardiovascular:     Rate and Rhythm: Normal rate.     Pulses: Normal pulses.  Pulmonary:     Effort: Pulmonary effort is normal.  Abdominal:     General: Abdomen is flat. There is distension.     Palpations: There is no mass.     Tenderness: There is no abdominal tenderness. There is no guarding or rebound.     Hernia: A hernia is present.   Musculoskeletal:        General: No deformity. Normal range of motion.  Skin:    General: Skin is warm.     Capillary Refill: Capillary refill takes less than 2 seconds.     Coloration: Skin is not jaundiced.     Findings: Bruising and lesion present.  Neurological:     General: No focal deficit present.     Mental Status: She is alert and oriented to person, place, and time.  Psychiatric:        Mood and Affect: Mood normal.        Behavior: Behavior normal.        Thought Content: Thought content normal.     Assessment & Plan:  Panniculitis  The procedure the patient selected and that was best for the patient was discussed. The risk were discussed and include but not limited to the following:   fluid accumulation, firmness of the tissue, skin loss, change in skin, fat necrosis, bleeding, infection and healing delay.  There are risks of anesthesia and injury to nerves or blood vessels.  Allergic reaction to tape, suture and skin glue are possible.  There will be swelling.  Any of these can lead to the need for revisional surgery.  Changes in the abdomen will continue to occur over time:  weight gain or weigh loss.    Total time: 45 minutes. This includes time spent with the patient during the visit as well as time spent before and after the visit reviewing the chart, documenting the encounter and ordering pertinent studies. and literature emailed to the patient.   Physical therapy:  Done in the past  The patient is a candidate for panniculectomy.  She may want to add liposuction.  She will need preoperative clearance from her PCP. Pictures were obtained of the patient and placed in the chart with the patient's or guardian's permission.   [place], DO

## 2021-05-23 ENCOUNTER — Telehealth: Payer: Self-pay

## 2021-05-23 NOTE — Chronic Care Management (AMB) (Signed)
    Chronic Care Management Pharmacy Assistant   Name: Nancy [name]  MRN: [id number] DOB: 1951/03/03   Reason for Encounter: Medication Review/ Medication Coordination for Enhanced Pharmacy Services.   Recent office visits:  None  Recent consult visits:  05-20-2021 Wallace Going, DO. STOP Linzess 72 MCG.   Hospital visits:  None in previous 6 months  Medications: Outpatient Encounter Medications as of 05/23/2021  Medication Sig  . Accu-Chek Softclix Lancets lancets Use to check blood sugars daily E11.69  . atorvastatin (LIPITOR) 20 MG tablet TAKE 1 TABLET BY MOUTH EVERY NIGHT FOR CHOLESTEROL  . azelastine (OPTIVAR) 0.05 % ophthalmic solution INSTILL ONE DROP IN [facility] EYE TWICE DAILY  . Blood Glucose Monitoring Suppl (ACCU-CHEK GUIDE) w/Device KIT 1 Device by Does not apply route daily.  . Cholecalciferol 50 MCG (2000 UT) TABS Take 1 tablet by mouth daily.  . Glucosamine HCl 1500 MG TABS Take 1 tablet by mouth daily.  Marland Kitchen glucose blood (ACCU-CHEK GUIDE) test strip Use as instructed  . Lactobacillus (PROBIOTIC ACIDOPHILUS PO) Take 1 tablet by mouth daily.  Marland Kitchen levothyroxine (SYNTHROID) 150 MCG tablet TAKE 2 TABLETS ON WED AND SATURDAY AND ONE TABLET ONCE A DAY THE OTHER DAYS  . Magnesium 250 MG TABS Take 1 tablet by mouth daily.  . Multiple Vitamins-Minerals (MULTIVITAMIN WOMEN 50+) TABS Take 1 tablet by mouth daily.  Marland Kitchen spironolactone (ALDACTONE) 50 MG tablet TAKE 1 TABLET BY MOUTH EVERY DAY  . triamcinolone cream (KENALOG) 0.1 % APPLY A THIN LAYER TWICE A DAY AS NEEDED   No facility-administered encounter medications on file as of 05/23/2021.   Contacted patient for [facility] onboarding. Reviewed medications with patient and uploaded for for CPP review. Informed [facility] to contact [facility] to cancel automatic fills. Message sent to PCP for refills on kenelog cream since patient is out. Sent message to PCP to change directions on Levothyroxine 150 MCG to one tablet daily and  informed them that patient will be switching all maintenance medications to [facility].  Star Rating Drugs: Atorvastatin 20 MG- Last filled 03-24-2021 [facility]   [place] Clinical Pharmacist Assistant [phone]

## 2021-05-26 ENCOUNTER — Other Ambulatory Visit: Payer: Self-pay

## 2021-05-26 DIAGNOSIS — R7303 Prediabetes: Secondary | ICD-10-CM

## 2021-05-26 DIAGNOSIS — I10 Essential (primary) hypertension: Secondary | ICD-10-CM

## 2021-05-26 DIAGNOSIS — E039 Hypothyroidism, unspecified: Secondary | ICD-10-CM

## 2021-05-26 MED ORDER — ATORVASTATIN CALCIUM 20 MG PO TABS: ORAL_TABLET | ORAL | 1 refills | Status: DC

## 2021-05-26 MED ORDER — TRIAMCINOLONE ACETONIDE 0.1 % EX CREA: TOPICAL_CREAM | CUTANEOUS | 0 refills | Status: DC

## 2021-05-26 MED ORDER — LEVOTHYROXINE SODIUM 150 MCG PO TABS: 150.0000 ug | ORAL_TABLET | Freq: Every day | ORAL | 1 refills | Status: DC

## 2021-05-26 MED ORDER — ACCU-CHEK GUIDE VI STRP: ORAL_STRIP | 12 refills | Status: DC

## 2021-05-26 MED ORDER — ACCU-CHEK SOFTCLIX LANCETS MISC: 2 refills | Status: DC

## 2021-05-26 MED ORDER — SPIRONOLACTONE 50 MG PO TABS: 50.0000 mg | ORAL_TABLET | Freq: Every day | ORAL | 1 refills | Status: DC

## 2021-06-04 ENCOUNTER — Telehealth: Payer: Self-pay

## 2021-06-04 NOTE — Chronic Care Management (AMB) (Signed)
  Patient aware of telephone appointment with Orlando Penner CPP on 06-05-2021 at 1:00. Patient aware to have/bring all medications, supplements, blood pressure and/or blood sugar logs to visit.  Questions: Have you had any recent office visit or specialist visit outside of [place]? Patient stated no  Are there any concerns you would like to discuss during your office visit? Patient stated no  Are you having any problems obtaining your medications? (Whether it pharmacy issues or cost) Patient has recently switched to [facility].   Star Rating Drug: Atorvastatin 20 mg- Last filled [facility]-24-2021 [facility]  Any gaps in medications fill history?  [place] Pharmacist Assistant [phone]

## 2021-06-05 ENCOUNTER — Ambulatory Visit (INDEPENDENT_AMBULATORY_CARE_PROVIDER_SITE_OTHER): Payer: Medicare HMO

## 2021-06-05 DIAGNOSIS — I10 Essential (primary) hypertension: Secondary | ICD-10-CM

## 2021-06-05 DIAGNOSIS — E782 Mixed hyperlipidemia: Secondary | ICD-10-CM | POA: Diagnosis not present

## 2021-06-05 DIAGNOSIS — R7303 Prediabetes: Secondary | ICD-10-CM

## 2021-06-05 NOTE — Progress Notes (Signed)
Chronic Care Management Pharmacy Note  06/05/2021 Name:  Nancy [name] MRN:  [id number] DOB:  03-13-1951  Summary: Patient presents today with questions about a healthy diet and vaccination recommendations   Recommendations/Changes made from today's visit: -Recommend patient receive COVID-19 second booster dose -Recommend patient receive second shingrix dose.   Plan: -[facility], enrolled by me.  -Patient to continue to limit high carbohydrate diet     Subjective: Nancy [name] is an 70 y.o. year old female who is a primary patient of [name], [place].  The CCM team was consulted for assistance with disease management and care coordination needs.    Engaged with patient by telephone for follow up visit in response to provider referral for pharmacy case management and/or care coordination services.   Consent to Services:  The patient was given information about Chronic Care Management services, agreed to services, and gave verbal consent prior to initiation of services.  Please see initial visit note for detailed documentation.   Patient Care Team: [name], FNP as PCP - General (General Practice) Jerline Pain, MD as PCP - Cardiology (Cardiology) Mayford Knife, [facility] (Pharmacist)  Recent office visits:  None   Recent consult visits:  05-20-2021 Wallace Going, DO. STOP Linzess 72 MCG.  Hospital visits: None in previous 6 months   Objective:  Lab Results  Component Value Date   CREATININE 1.11 (H) 02/12/2021   BUN 21 02/12/2021   GFRNONAA 51 (L) 02/12/2021   GFRAA 59 (L) 02/12/2021   NA 142 02/12/2021   K 4.1 02/12/2021   CALCIUM 9.7 02/12/2021   CO2 22 02/12/2021   GLUCOSE 76 02/12/2021    Lab Results  Component Value Date/Time   HGBA1C 5.6 02/12/2021 02:12 PM   HGBA1C 5.6 07/09/2020 02:21 PM   MICROALBUR 10 02/12/2021 12:47 PM   MICROALBUR 10 09/25/2019 04:40 PM    Last diabetic Eye exam:  Lab  Results  Component Value Date/Time   HMDIABEYEEXA No Retinopathy 12/05/2020 12:00 AM    Last diabetic Foot exam: No results found for: HMDIABFOOTEX   Lab Results  Component Value Date   CHOL 216 (H) 02/12/2021   HDL 69 02/12/2021   LDLCALC 133 (H) 02/12/2021   TRIG 79 02/12/2021   CHOLHDL 3.1 02/12/2021    Hepatic Function Latest Ref Rng & Units 02/12/2021 07/09/2020 04/10/2020  Total Protein 6.0 - 8.5 g/dL 7.3 7.2 8.1  Albumin 3.8 - 4.8 g/dL 4.6 4.5 4.6  AST 0 - 40 IU/L 23 24 46(H)  ALT 0 - 32 IU/L 14 17 40(H)  Alk Phosphatase 44 - 121 IU/L 69 63 172(H)  Total Bilirubin 0.0 - 1.2 mg/dL 0.6 0.7 0.4    Lab Results  Component Value Date/Time   TSH 2.500 04/10/2021 10:02 AM   TSH 4.610 (H) 02/12/2021 02:12 PM   FREET4 1.40 02/12/2021 02:12 PM    CBC Latest Ref Rng & Units 02/12/2021 01/11/2020  WBC 3.4 - 10.8 x10E3/uL 5.9 6.0  Hemoglobin 11.1 - 15.9 g/dL 14.6 14.9  Hematocrit 34.0 - 46.6 % 44.8 44.7  Platelets 150 - 450 x10E3/uL 234 244    No results found for: VD25OH  Clinical ASCVD: No  The ASCVD Risk score Mikey Bussing DC Jr., et al., 2013) failed to calculate for the following reasons:   Unable to determine if patient is Non-Hispanic African American    Depression screen [facility] 2/9 01/16/2021 04/10/2020 01/11/2020  Decreased Interest 0 0 0  Down,  Depressed, Hopeless 0 0 0  PHQ - 2 Score 0 0 0  Altered sleeping - - 0  Tired, decreased energy - - 0  Change in appetite - - 0  Feeling bad or failure about yourself  - - 0  Trouble concentrating - - 0  Moving slowly or fidgety/restless - - 0  Suicidal thoughts - - 0  PHQ-9 Score - - 0  Difficult doing work/chores - - Not difficult at all     Social History   Tobacco Use  Smoking Status Former   Pack years: 0.00  Smokeless Tobacco Never   BP Readings from Last 3 Encounters:  05/20/21 116/73  02/12/21 128/72  07/09/20 118/68   Pulse Readings from Last 3 Encounters:  05/20/21 62  02/12/21 60  07/09/20 (!) 55   Wt  Readings from Last 3 Encounters:  05/20/21 240 lb 6.4 oz (109 kg)  02/12/21 238 lb 3.2 oz (108 kg)  01/16/21 235 lb (106.6 kg)   BMI Readings from Last 3 Encounters:  05/20/21 40.00 kg/m  02/12/21 39.16 kg/m  01/16/21 40.34 kg/m    Assessment/Interventions: Review of patient past medical history, allergies, medications, health status, including review of consultants reports, laboratory and other test data, was performed as part of comprehensive evaluation and provision of chronic care management services.   SDOH:  (Social Determinants of Health) assessments and interventions performed: Yes  SDOH Screenings   Alcohol Screen: Not on file  Depression (PHQ2-9): Low Risk    PHQ-2 Score: 0  Financial Resource Strain: Low Risk    Difficulty of Paying Living Expenses: Not hard at all  Food Insecurity: No Food Insecurity   Worried About Charity fundraiser in the Last Year: Never true   Ran Out of Food in the Last Year: Never true  Housing: Not on file  Physical Activity: Sufficiently Active   Days of Exercise per Week: 7 days   Minutes of Exercise per Session: 120 min  Social Connections: Not on file  Stress: No Stress Concern Present   Feeling of Stress : Not at all  Tobacco Use: Medium Risk   Smoking Tobacco Use: Former   Smokeless Tobacco Use: Never  Transportation Needs: No Data processing manager (Medical): No   Lack of Transportation (Non-Medical): No    CCM Care Plan  No Known Allergies  Medications Reviewed Today     Reviewed by Mayford Knife, RPH (Pharmacist) on 06/05/21 at 1403  Med List Status: <None>   Medication Order Taking? Sig Documenting Provider Last Dose Status Informant  Accu-Chek Softclix Lancets lancets 209470962  Use to check blood sugars daily E11.69 [name], FNP  Active   atorvastatin (LIPITOR) 20 MG tablet [id number]  TAKE 1 TABLET BY MOUTH EVERY NIGHT FOR CHOLESTEROL [name], FNP  Active   azelastine  (OPTIVAR) 0.05 % ophthalmic solution [id number] No INSTILL ONE DROP IN [facility] EYE TWICE DAILY [name], FNP Taking Active   Blood Glucose Monitoring Suppl (ACCU-CHEK GUIDE) w/Device KIT [id number] No 1 Device by Does not apply route daily. [name], FNP Taking Active   Cholecalciferol 50 MCG (2000 UT) TABS [id number] No Take 1 tablet by mouth daily. [provider] Taking Active   Glucosamine HCl 1500 MG TABS [id number] No Take 1 tablet by mouth daily. [provider] Taking Active   glucose blood (ACCU-CHEK GUIDE) test strip [id number]  Use as instructed [name], FNP  Active   Lactobacillus (PROBIOTIC ACIDOPHILUS PO) [id number]  No Take 1 tablet by mouth daily. [provider] Taking Active   levothyroxine (SYNTHROID) 150 MCG tablet [id number]  Take 1 tablet (150 mcg total) by mouth daily before breakfast. [name], FNP  Active   Magnesium 250 MG TABS [id number] No Take 1 tablet by mouth daily. [provider] Taking Active   Multiple Vitamins-Minerals (MULTIVITAMIN WOMEN 50+) TABS [id number] No Take 1 tablet by mouth daily. [provider] Taking Active   spironolactone (ALDACTONE) 50 MG tablet [id number]  Take 1 tablet (50 mg total) by mouth daily. 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   Bifascicular block 02/08/2020   Family history of early CAD 02/08/2020   Prediabetes 09/25/2019   Dry eyes 09/25/2019   Mixed hyperlipidemia 09/25/2019   Essential hypertension 09/25/2019    Immunization History  Administered Date(s) Administered   Influenza, High Dose Seasonal PF 08/13/2019   Influenza,inj,Quad PF,6+ Mos 08/13/2019   PFIZER(Purple Top)SARS-COV-2  Vaccination 02/16/2020, 03/13/2020, 10/03/2020   Tdap 01/11/2020   Zoster Recombinat (Shingrix) 08/29/2020    Conditions to be addressed/monitored:  Hypertension, Hyperlipidemia, and Prediabetes  Care Plan : [place]  Updates made by Mayford Knife, RPH since 06/05/2021 12:00 AM     Problem: HTN, Prediabetes, HLD   Priority: High     Long-Range Goal: Disease Management   This Visit's Progress: On track  Priority: High  Note:    Current Barriers:  Unable to independently monitor therapeutic efficacy  Pharmacist Clinical Goal(s):  Patient will achieve adherence to monitoring guidelines and medication adherence to achieve therapeutic efficacy through collaboration with PharmD and provider.   Interventions: 1:1 collaboration with [name], FNP regarding development and update of comprehensive plan of care as evidenced by provider attestation and co-signature Inter-disciplinary care team collaboration (see longitudinal plan of care) Comprehensive medication review performed; medication list updated in electronic medical record  Hypertension (BP goal <130/80) -Controlled -Current treatment: Spironolactone 50 mg tablet once per day.  -Current home readings: patient reports that she does not check her BP at home  -Denies hypotensive/hypertensive symptoms -[place] heart brochure: [email or web address] -Educated on BP goals and benefits of medications for prevention of heart attack, stroke and kidney damage; Daily salt intake goal < 2300 mg; Exercise goal of 150 minutes per week; Importance of home blood pressure monitoring; Proper BP monitoring technique; -Counseled to monitor BP at home least twice per day, document, and provide log at future appointments -Recommended to continue current medication -Collaborate with PCP to have patients BP cuff prescription sent to the pharmacy   Hyperlipidemia: (LDL goal <  100) -Uncontrolled -Current treatment: Atorvastatin 20 mg tablet once per day  -Current dietary patterns: patient is avoiding fried and fatty foods -Current exercise habits: Patient reports exercising every day - she walks 4 miles on a treadmill and it takes her 2 hours, she is working on decreasing the time.  -Educated on Cholesterol goals;  Benefits of statin for ASCVD risk reduction; Importance of limiting foods high in cholesterol; Exercise goal of 150 minutes per week; -Recommended to continue current medication  PreDiabetes (A1c goal <6.5%) -Not ideally controlled -Current home glucose readings fasting  glucose: 75-95 -Current meal patterns:  breakfast: soup or salad , or fruit lunch: Tofu, or oatmeal, oatmeal muffing  dinner: Tofu with vegetables, and she eats a lot of cheese, sometimes she eats red meat when at someone's house  snacks: sliced [place] from the deli sections drinks: water, coffee, tea - sometimes adding flavoring, she mostly drinks water or coffee - with cream and equal  She has cut back on rice, bread and carbohydrates   -Educated on Complications of diabetes including kidney damage, retinal damage, and cardiovascular disease; Carbohydrate counting and/or plate method -Counseled to check feet daily and get yearly eye exams -Counseled on diet and exercise extensively Recommended to continue current medication Add a healthy meal plan option   Patient Goals/Self-Care Activities Patient will:  - take medications as prescribed  Follow Up Plan: The patient has been provided with contact information for the care management team and has been advised to call with any health related questions or concerns.       Medication Assistance: None required.  Patient affirms current coverage meets needs.  Compliance/Adherence/Medication fill history: Care Gaps: Shingrix Vaccine Foot Exam COVID 19 Booster  Star-Rating Drugs: Atorvastatin 20 mg   Patient's preferred  pharm[facility]  [id number] [address]: [phone]: [phone] [facility] [place] [address] [place]Phone: [phone]: [phone] Uses pill box? Yes Pt endorses 95% compliance  We discussed: Benefits of medication synchronization, packaging and delivery as well as enhanced pharmacist oversight with [facility]. Patient decided to: Continue current medication management strategy  Care Plan and Follow Up Patient Decision:  Patient agrees to Care Plan and Follow-up.  Plan: The patient has been provided with contact information for the care management team and has been advised to call with any health related questions or concerns.   [name] PharmD Clinical Pharmacist [facility] [phone]

## 2021-06-05 NOTE — Patient Instructions (Addendum)
Visit Information It was great speaking with you today!  Please let me know if you have any questions about our visit.   Goals Addressed             This Visit's Progress    Track and Manage My Blood Pressure-Hypertension       Timeframe:  Long-Range Goal Priority:  High Start Date:                             Expected End Date:                       Follow Up Date 12/02/2021    - check blood pressure 3 times per week - choose a place to take my blood pressure (home, clinic or office, retail store) - write blood pressure results in a log or diary    Why is this important?   You won't feel high blood pressure, but it can still hurt your blood vessels.  High blood pressure can cause heart or kidney problems. It can also cause a stroke.  Making lifestyle changes like losing a little weight or eating less salt will help.  Checking your blood pressure at home and at different times of the day can help to control blood pressure.  If the doctor prescribes medicine remember to take it the way the doctor ordered.  Call the office if you cannot afford the medicine or if there are questions about it.              Patient Care Plan: CCM Pharmacy Care Plan     Problem Identified: HTN, Prediabetes, HLD   Priority: High     Long-Range Goal: Disease Management   This Visit's Progress: On track  Priority: High  Note:    Current Barriers:  Unable to independently monitor therapeutic efficacy  Pharmacist Clinical Goal(s):  Patient will achieve adherence to monitoring guidelines and medication adherence to achieve therapeutic efficacy through collaboration with PharmD and provider.   Interventions: 1:1 collaboration with [name], FNP regarding development and update of comprehensive plan of care as evidenced by provider attestation and co-signature Inter-disciplinary care team collaboration (see longitudinal plan of care) Comprehensive medication review performed;  medication list updated in electronic medical record  Hypertension (BP goal <130/80) -Controlled -Current treatment: Spironolactone 50 mg tablet once per day.  -Current home readings: patient reports that she does not check her BP at home  -Denies hypotensive/hypertensive symptoms -[place] heart brochure: [email or web address] -Educated on BP goals and benefits of medications for prevention of heart attack, stroke and kidney damage; Daily salt intake goal < 2300 mg; Exercise goal of 150 minutes per week; Importance of home blood pressure monitoring; Proper BP monitoring technique; -Counseled to monitor BP at home least twice per day, document, and provide log at future appointments -Recommended to continue current medication -Collaborate with PCP to have patients BP cuff prescription sent to the pharmacy   Hyperlipidemia: (LDL goal < 100) -Uncontrolled -Current treatment: Atorvastatin 20 mg tablet once per day  -Current dietary patterns: patient is avoiding fried and fatty foods -Current exercise habits: Patient reports exercising every day - she walks 4 miles on a treadmill and it takes her 2 hours, she is working on decreasing the time.  -Educated on Cholesterol goals;  Benefits of statin for ASCVD risk reduction; Importance of limiting foods high in cholesterol; Exercise goal of 150 minutes per  week; -Recommended to continue current medication  PreDiabetes (A1c goal <6.5%) -Not ideally controlled -Current home glucose readings fasting glucose: 75-95 -Current meal patterns:  breakfast: soup or salad , or fruit lunch: Tofu, or oatmeal, oatmeal muffing  dinner: Tofu with vegetables, and she eats a lot of cheese, sometimes she eats red meat when at someone's house  snacks: sliced [place] from the deli sections drinks: water, coffee, tea - sometimes adding flavoring, she mostly drinks water or coffee - with cream and equal  She has cut back on rice,  bread and carbohydrates   -Educated on Complications of diabetes including kidney damage, retinal damage, and cardiovascular disease; Carbohydrate counting and/or plate method -Counseled to check feet daily and get yearly eye exams -Counseled on diet and exercise extensively Recommended to continue current medication Add a healthy meal plan option   Patient Goals/Self-Care Activities Patient will:  - take medications as prescribed  Follow Up Plan: The patient has been provided with contact information for the care management team and has been advised to call with any health related questions or concerns.       Patient agreed to services and verbal consent obtained.   The patient verbalized understanding of instructions, educational materials, and care plan provided today and agreed to receive a mailed copy of patient instructions, educational materials, and care plan.   Orlando Penner, PharmD Clinical Pharmacist [facility] [phone]

## 2021-06-16 ENCOUNTER — Encounter: Payer: Self-pay | Admitting: Nurse Practitioner

## 2021-06-24 ENCOUNTER — Other Ambulatory Visit: Payer: Self-pay | Admitting: Nurse Practitioner

## 2021-07-08 ENCOUNTER — Telehealth: Payer: Self-pay

## 2021-07-08 ENCOUNTER — Other Ambulatory Visit: Payer: Self-pay

## 2021-07-08 MED ORDER — BLOOD PRESSURE MONITOR KIT: PACK | 2 refills | Status: DC

## 2021-07-08 MED ORDER — GLUCOSE BLOOD VI STRP: ORAL_STRIP | 12 refills | Status: DC

## 2021-07-08 MED ORDER — BLOOD GLUCOSE METER KIT: PACK | 3 refills | Status: DC

## 2021-07-08 NOTE — Chronic Care Management (AMB) (Signed)
    Chronic Care Management Pharmacy Assistant   Name: Nancy [name]  MRN: [id number] DOB: 30-Aug-1951  Reason for Encounter: Coordination of Enhanced Pharmacy Services  07/08/2021- Called patient to follow up on medications needed to be delivered from [facility]. No answer, left message for patient to return call. Patient returned call she explained that she was in need of her Levothyroxine, Test Strips, and Lancets. Patient stated she would also like a blood pressure monitor. Patient aware I will request these refills and new blood pressure monitor to be sent to [facility] for delivery tomorrow. Patient confirmed she has a OneTouch Ultra 2 monitor and just needed the test strips and lancet. Request sent to PCP office, patient's existing onboard form which she was only getting Optivar eye drops updated to adding medications she is in need of refilled and sent to [facility]. Patient aware [facility] will call to coordinate delivery time with patient for tomorrow.    Medications: Outpatient Encounter Medications as of 07/08/2021  Medication Sig   Accu-Chek Softclix Lancets lancets Use to check blood sugars daily E11.69   atorvastatin (LIPITOR) 20 MG tablet TAKE 1 TABLET BY MOUTH EVERY NIGHT FOR CHOLESTEROL   azelastine (OPTIVAR) 0.05 % ophthalmic solution INSTILL ONE DROP IN EACH EYE TWICE DAILY   Blood Glucose Monitoring Suppl (ACCU-CHEK GUIDE) w/Device KIT 1 Device by Does not apply route daily.   Cholecalciferol 50 MCG (2000 UT) TABS Take 1 tablet by mouth daily.   Glucosamine HCl 1500 MG TABS Take 1 tablet by mouth daily.   glucose blood (ACCU-CHEK GUIDE) test strip Use as instructed   Lactobacillus (PROBIOTIC ACIDOPHILUS PO) Take 1 tablet by mouth daily.   levothyroxine (SYNTHROID) 150 MCG tablet Take 1 tablet (150 mcg total) by mouth daily before breakfast.   Magnesium 250 MG TABS Take 1 tablet by mouth daily.   Multiple Vitamins-Minerals (MULTIVITAMIN WOMEN 50+) TABS  Take 1 tablet by mouth daily.   spironolactone (ALDACTONE) 50 MG tablet Take 1 tablet (50 mg total) by mouth daily.   triamcinolone cream (KENALOG) 0.1 % APPLY A THIN LAYER TWICE A DAY AS NEEDED   No facility-administered encounter medications on file as of 07/08/2021.    Care Gaps: PNA vac Low Risk Adult- Overdue - never done Zoster Vaccines- Shingrix (2 of 2)-Last completed: Aug 29, 2020  FOOT EXAM Ammie Dalton completed: Jan 11, 2020 INFLUENZA VACCINE- Due AUG 1,2022  Annual Wellness Exam completed 01/16/2021  Star Rating Drugs: None  Follow up visit with CCM Pharmacist- Orlando Penner on 12/02/2021 at 2:00 PM.   Pattricia Boss, [place] Pharmacist Assistant [phone]

## 2021-07-16 ENCOUNTER — Ambulatory Visit: Payer: Medicare HMO | Admitting: Physician Assistant

## 2021-07-16 ENCOUNTER — Encounter: Payer: Self-pay | Admitting: Physician Assistant

## 2021-07-16 ENCOUNTER — Other Ambulatory Visit: Payer: Self-pay

## 2021-07-16 ENCOUNTER — Other Ambulatory Visit: Payer: Self-pay | Admitting: Nurse Practitioner

## 2021-07-16 DIAGNOSIS — R7303 Prediabetes: Secondary | ICD-10-CM

## 2021-07-16 DIAGNOSIS — L409 Psoriasis, unspecified: Secondary | ICD-10-CM

## 2021-07-16 DIAGNOSIS — L304 Erythema intertrigo: Secondary | ICD-10-CM

## 2021-07-16 MED ORDER — KETOCONAZOLE 2 % EX CREA: 1.0000 "application " | TOPICAL_CREAM | Freq: Two times a day (BID) | CUTANEOUS | 0 refills | Status: AC

## 2021-07-16 MED ORDER — DESOXIMETASONE 0.25 % EX CREA: TOPICAL_CREAM | CUTANEOUS | 4 refills | Status: DC

## 2021-07-16 MED ORDER — CLOBETASOL PROPIONATE 0.05 % EX OINT: 1.0000 "application " | TOPICAL_OINTMENT | Freq: Two times a day (BID) | CUTANEOUS | 0 refills | Status: DC

## 2021-07-16 MED ORDER — CLOBETASOL PROPIONATE 0.05 % EX SOLN: CUTANEOUS | 6 refills | Status: DC

## 2021-07-16 NOTE — Progress Notes (Signed)
   New Patient   Subjective  Nancy [name] is a 70 y.o. AA female who presents for the following: Skin Problem (Patient here cause PCP sent her because of her skin rash. Possible psoriasis. She has them on arms, chest area and abdomen. She also has it in her scalp. Her PCP gave her  triamcinolone cream. It does help some but takes a long time. The lesions do itch. ).  She also gets psoriasis scale under her breasts, pannus and folds in her back. Sometimes painful.   The following portions of the chart were reviewed this encounter and updated as appropriate:  Tobacco  Allergies  Meds  Problems  Med Hx  Surg Hx  Fam Hx      Objective  Well appearing patient in no apparent distress; mood and affect are within normal limits.  A full examination was performed including scalp, head, eyes, ears, nose, lips, neck, chest, axillae, abdomen, back, buttocks, bilateral upper extremities, bilateral lower extremities, hands, feet, fingers, toes, fingernails, and toenails. All findings within normal limits unless otherwise noted below.  both elbows, both knees, postauricular back and face., scalp- posterior Small round plaques.  Chest (Upper Torso, Anterior) Erythema and scale  Assessment & Plan  Psoriasis scalp- posterior; dorsum of both elbows,and both knees, postauricular,  back and face.  clobetasol (TEMOVATE) 0.05 % external solution -scalp- posterior Apply to scalp nightly  clobetasol ointment (TEMOVATE) 0.05 % - both elbows, both knees, postauricular , back and face. Apply 1 application topically 2 (two) times daily. Apply to elbows knees and back behind ears nightly  Intertrigo Chest (Upper Torso, Anterior)  desoximetasone (TOPICORT) 0.25 % cream - Chest (Upper Torso, Anterior) Apply to skin daily: safe to use on face and skin folds  ketoconazole (NIZORAL) 2 % cream - Chest (Upper Torso, Anterior) Apply 1 application topically 2 (two) times daily. Apply under breast  daily     I, Aariz Maish, PA-C, have reviewed all documentation's for this visit.  The documentation on 07/16/21 for the exam, diagnosis, procedures and orders are all accurate and complete.

## 2021-07-16 NOTE — Patient Instructions (Signed)
clobetasol (TEMOVATE) 0.05 % external solution                          Apply to scalp nightly, Normal                               clobetasol ointment (TEMOVATE) 0.05 %                         Apply 1 application topically 2 (two) times daily. Apply to elbows knees and back behind ears nightly, Starting Wed 07/16/2021, Normal                       desoximetasone (TOPICORT) 0.25 % cream                         Apply to skin daily safe to use on face and skin folds, Normal               ketoconazole (NIZORAL) 2 % cream                         Apply 1 application topically 2 (two) times daily. Apply under breast daily, Starting Wed 07/16/2021, Until Wed 08/27/2021, Normal  Ketoconazole also under breast and skin folds groin

## 2021-07-21 ENCOUNTER — Telehealth: Payer: Self-pay | Admitting: Physician Assistant

## 2021-07-21 DIAGNOSIS — L409 Psoriasis, unspecified: Secondary | ICD-10-CM

## 2021-07-21 DIAGNOSIS — L304 Erythema intertrigo: Secondary | ICD-10-CM

## 2021-07-21 NOTE — Telephone Encounter (Signed)
Patient is calling to say that the 3 medications are over $100.00 each.  The Clobetasol 0.05% Solution and Clobetasol 0.05% Ointment per patient per pharmacist has no generic.  Patient says that the pharmacist recommends Triamcinolone 0.5% for the Desoximetasone.  Patient says that she is willing to pay the $100.00 plus if that is what Robyne Askew, PA-C recommends because "getting rid of the problem is more important than the money.

## 2021-07-22 MED ORDER — CLOBETASOL PROPIONATE 0.05 % EX SOLN: CUTANEOUS | 6 refills | Status: DC

## 2021-07-22 MED ORDER — DESOXIMETASONE 0.25 % EX CREA: TOPICAL_CREAM | CUTANEOUS | 4 refills | Status: DC

## 2021-07-22 MED ORDER — CLOBETASOL PROPIONATE 0.05 % EX OINT: TOPICAL_OINTMENT | CUTANEOUS | 4 refills | Status: DC

## 2021-07-22 NOTE — Telephone Encounter (Signed)
Phone call to patient to inform her that she can get all three of the prescriptions from [facility] using [facility] for $75 cash pay.  Patient aware and she'd like the prescriptions sent to the [facility] near her. Prescriptions escribed.

## 2021-08-05 ENCOUNTER — Other Ambulatory Visit: Payer: Self-pay | Admitting: Surgery

## 2021-08-05 ENCOUNTER — Telehealth: Payer: Self-pay

## 2021-08-05 DIAGNOSIS — K439 Ventral hernia without obstruction or gangrene: Secondary | ICD-10-CM | POA: Diagnosis not present

## 2021-08-05 NOTE — Chronic Care Management (AMB) (Signed)
Chronic Care Management Pharmacy Assistant   Name: Deshaun Schou  MRN: [id number] DOB: 1951-09-21   Reason for Encounter: Medication Review/ Medication coordination  Recent office visits:  None  Recent consult visits:  07-16-2021 Starlyn Skeans (Dermatology). START Nizoral 2% apply under breast twice daily. START Topicort 0.25% cream daily. START Temovate 0.05% solution apply to scalp nightly. START Temovate 0.05% ointment two times daily. Apply to elbows knees and back behind ears nightly.  Hospital visits:  None in previous 6 months  Medications: Outpatient Encounter Medications as of 08/05/2021  Medication Sig   Accu-Chek Softclix Lancets lancets Use to check blood sugars daily E11.69   atorvastatin (LIPITOR) 20 MG tablet TAKE 1 TABLET BY MOUTH EVERY NIGHT FOR CHOLESTEROL   azelastine (OPTIVAR) 0.05 % ophthalmic solution INSTILL ONE DROP IN EACH EYE TWICE DAILY   blood glucose meter kit and supplies Dispense based on patient and insurance preference. Use up to four times daily as directed. (FOR ICD-10 E10.9, E11.9).   Blood Glucose Monitoring Suppl (ACCU-CHEK GUIDE) w/Device KIT 1 Device by Does not apply route daily.   Blood Pressure Monitor KIT USE TO CHECK BLOOD PRESSURE 110   Cholecalciferol 50 MCG (2000 UT) TABS Take 1 tablet by mouth daily.   clobetasol (TEMOVATE) 0.05 % external solution Apply to scalp nightly   clobetasol ointment (TEMOVATE) 0.05 % Apply to elbows knees and back behind ears nightly   desoximetasone (TOPICORT) 0.25 % cream Apply to skin daily safe to use on face and skin folds   Glucosamine HCl 1500 MG TABS Take 1 tablet by mouth daily.   ketoconazole (NIZORAL) 2 % cream Apply 1 application topically 2 (two) times daily. Apply under breast daily   Lactobacillus (PROBIOTIC ACIDOPHILUS PO) Take 1 tablet by mouth daily.   levothyroxine (SYNTHROID) 150 MCG tablet Take 1 tablet (150 mcg total) by mouth daily before breakfast.   Magnesium 250 MG TABS  Take 1 tablet by mouth daily.   Multiple Vitamins-Minerals (MULTIVITAMIN WOMEN 50+) TABS Take 1 tablet by mouth daily.   ONETOUCH ULTRA test strip USE AS INSTRUCTED   spironolactone (ALDACTONE) 50 MG tablet Take 1 tablet (50 mg total) by mouth daily.   triamcinolone cream (KENALOG) 0.1 % APPLY A THIN LAYER TWICE A DAY AS NEEDED   No facility-administered encounter medications on file as of 08/05/2021.   Reviewed chart for medication changes ahead of medication coordination call.  No OVs, Consults, or hospital visits since last care coordination call/Pharmacist visit. (If appropriate, list visit date, provider name)  No medication changes indicated OR if recent visit, treatment plan here.  BP Readings from Last 3 Encounters:  05/20/21 116/73  02/12/21 128/72  07/09/20 118/68    Lab Results  Component Value Date   HGBA1C 5.6 02/12/2021     Patient obtains medications through Vials  90 Days   Last adherence delivery included:  None  Patient declined (meds) last month:  None  Explanation of abundance on hand (ie #30 due to overlapping fills or previous adherence issues etc)  Patient is due for next adherence delivery on: 08-12-2021  Called patient and reviewed medications and coordinated delivery.  This delivery to include: Levothyroxine 150 mcg daily Atorvastatin 20 mg daily  No acute/ short fill needed  Patient declined the following medications: None  Patient needs refills for: None  Confirmed delivery date of 08-12-2021 advised patient that pharmacy will contact them the morning of delivery.  Care Gaps: PNA vac Low Risk Adult- Overdue -  never done Zoster Vaccines- Shingrix (2 of 2)-Last completed: Aug 29, 2020       FOOT EXAM Ammie Dalton completed: Jan 11, 2020 INFLUENZA VACCINE- Due AUG 1,2022      Annual Wellness Exam completed 01/16/2021    Star Rating Drugs: Atorvastatin 20 mg- Last filled 07-17-2021 [facility]  [place]  Pharmacist Assistant [phone]

## 2021-08-26 ENCOUNTER — Telehealth (INDEPENDENT_AMBULATORY_CARE_PROVIDER_SITE_OTHER): Payer: Medicare HMO | Admitting: Plastic Surgery

## 2021-08-26 ENCOUNTER — Encounter: Payer: Self-pay | Admitting: Plastic Surgery

## 2021-08-26 DIAGNOSIS — R7303 Prediabetes: Secondary | ICD-10-CM | POA: Diagnosis not present

## 2021-08-26 DIAGNOSIS — M793 Panniculitis, unspecified: Secondary | ICD-10-CM

## 2021-08-26 NOTE — Progress Notes (Signed)
Subjective:    Patient ID: Nancy [name], female    DOB: 1951/03/27, 70 y.o.   MRN: [id number]  Patient is a 70 year old female joining me by phone.  She is 5 feet 5 inches tall weighs 240 pounds.  She has been evaluated for panniculectomy due to frequent breakdown of her skin folds.  She gets chafing and rashes and yeast infections.  She has tried all kinds of creams and lotions without improvement.  She also has a large hernia and has been evaluated by [facility] surgical.  She is not a smoker.  She has had previous abdominal surgery which included a cholecystectomy.  She has hypertension, hyperlipidemia and prediabetes.  She is eager to move on the surgery.  There have been no other major changes and she is planning on seeing her cardiologist and PCP for clearance.     Review of Systems  Constitutional: Negative.   Eyes: Negative.   Respiratory: Negative.    Cardiovascular: Negative.   Gastrointestinal: Negative.   Endocrine: Negative.   Genitourinary: Negative.   Musculoskeletal: Negative.   Skin: Negative.   Neurological: Negative.   Hematological: Negative.   Psychiatric/Behavioral: Negative.        Objective:   Physical Exam Vitals and nursing note reviewed.  Constitutional:      Appearance: Normal appearance.  HENT:     Head: Normocephalic and atraumatic.  Cardiovascular:     Rate and Rhythm: Normal rate.     Pulses: Normal pulses.  Pulmonary:     Effort: Pulmonary effort is normal.  Abdominal:     General: Abdomen is flat. There is distension.     Tenderness: There is abdominal tenderness.     Hernia: A hernia is present.  Musculoskeletal:        General: No swelling or deformity.  Skin:    General: Skin is warm.     Capillary Refill: Capillary refill takes less than 2 seconds.     Coloration: Skin is not jaundiced or pale.     Findings: Erythema, lesion and rash present. No bruising.  Neurological:     General: No focal deficit present.     Mental  Status: She is alert and oriented to person, place, and time.  Psychiatric:        Mood and Affect: Mood normal.        Behavior: Behavior normal.      Assessment & Plan:     ICD-10-CM   1. Prediabetes  R73.03     2. Panniculitis  M79.3       We do want cardiology and PCP clearance for surgery.  We will work with [facility] surgical to coordinate the surgery for panniculectomy with hernia repair.  The patient would like to have a little bit of liposuction and will need a quote for that.  We can do this at the surgery center [facility] with a plan for her to spend the night.  I mentioned that she will need somebody to stay with her for a few days.  I connected with  Devy Werth on 08/26/21 by a video enabled telemedicine application and verified that I am speaking with the correct person using two identifiers.  The patient was at home and I was at the office.  We spent 15 minutes in the following manner: Review of chart, review of plan, risks and complications and documentation.   I discussed the limitations of evaluation and management by telemedicine. The patient expressed understanding and  agreed to proceed.

## 2021-09-08 ENCOUNTER — Telehealth: Payer: Self-pay | Admitting: *Deleted

## 2021-09-08 NOTE — Telephone Encounter (Signed)
Pt is agreeable to plan of care for pre op appt with Robbie Lis, [facility] 09/24/21 @ 2:15. Will forward notes to [facility] for upcoming appt. Will send FYI to surgeon's office pt has appt 09/24/21.

## 2021-09-08 NOTE — Telephone Encounter (Signed)
   [facility] Pre-operative Risk Assessment    Patient Name: Nancy [name]  DOB: 04/04/1951 MRN: [id number]  HEARTCARE STAFF:  - IMPORTANT!!!!!! Under Visit Info/Reason for Call, type in Other and utilize the format Clearance MM/DD/YY or Clearance TBD. Do not use dashes or single digits. - Please review there is not already an duplicate clearance open for this procedure. - If request is for dental extraction, please clarify the # of teeth to be extracted. - If the patient is currently at the dentist's office, call Pre-Op Callback Staff (MA/nurse) to input urgent request.  - If the patient is not currently in the dentist office, please route to the Pre-Op pool.  Request for surgical clearance:  What type of surgery is being performed? PANNICULECTOMY WITH HERNIA REPAIR  When is this surgery scheduled? 10/20/21  What type of clearance is required (medical clearance vs. Pharmacy clearance to hold med vs. Both)? MEDICAL  Are there any medications that need to be held prior to surgery and how long? NONE LISTED   Practice name and name of physician performing surgery? [facility]; DR. Audelia Hives  What is the office phone number? [phone]   7.   What is the office fax number? [phone]  8.   Anesthesia type (None, local, MAC, general) ? GENERAL   Julaine Hua 09/08/2021, 1:53 PM  _________________________________________________________________   (provider comments below)

## 2021-09-08 NOTE — Telephone Encounter (Signed)
Primary Cardiologist:Mark Marlou Porch, MD  Chart reviewed as part of pre-operative protocol coverage. Because of Nancy [name]'s past medical history and time since last visit, he/she will require a follow-up visit in order to better assess preoperative cardiovascular risk.  Pre-op covering staff: - Please schedule appointment and call patient to inform them. - Please contact requesting surgeon's office via preferred method (i.e, phone, fax) to inform them of need for appointment prior to surgery.  If applicable, this message will also be routed to pharmacy pool and/or primary cardiologist for input on holding anticoagulant/antiplatelet agent as requested below so that this information is available at time of patient's appointment.   Deberah Pelton, NP  09/08/2021, 3:12 PM

## 2021-09-09 ENCOUNTER — Telehealth: Payer: Self-pay

## 2021-09-09 NOTE — Telephone Encounter (Signed)
  Chronic Care Management Pharmacy Note   09/09/2021 Name: Teresina Bugaj MRN: [id number] DOB: [birth year]   Referred by: [name], FNP Reason for referral : CCM Pharmacy Referral   Patient called with multiple questions: Vaccination Status: COVID-19 Booster 5th shot: Patient not eligible until 10/12/2021, let her know that this is the ealiest date since her booster was on 06/12/2021 Influenza Vaccine - patient is eligible for vaccination  Shingrix Vaccine status- reviewed patient NCIR status, confirmed patient received vaccine 11/06/2020, she is eligible for the second shot. Patient made aware, she asked if she needed to restart the series. I let her know that per CDC guidelines she should get the second shot at soon as possible, but she does not need to restart the series.  Patient spoke with insurance and she is eligible for a BP monitoring device. She was given three different places by her insurance company to call.  Let patient know that prescription for the device would come from PCP office, and we would send this information to [place] [facility].    She has started taking Saffron for appetite and mood, she was uncertain if this was a good idea. Explained to Ms. Morgan that herbal remedies can impact your health, and are not routinely monitored by the FDA so recommended that patient discontinue this remedy.      Follow Up Plan: The patient has been provided with contact information for the care management team and has been advised to call with any health related questions or concerns.   Orlando Penner, PharmD Clinical Pharmacist [facility] [phone]

## 2021-09-17 ENCOUNTER — Ambulatory Visit: Payer: Medicare HMO | Admitting: Nurse Practitioner

## 2021-09-18 ENCOUNTER — Encounter: Payer: Self-pay | Admitting: Nurse Practitioner

## 2021-09-18 ENCOUNTER — Ambulatory Visit (INDEPENDENT_AMBULATORY_CARE_PROVIDER_SITE_OTHER): Payer: Medicare HMO | Admitting: Nurse Practitioner

## 2021-09-18 ENCOUNTER — Other Ambulatory Visit: Payer: Self-pay

## 2021-09-18 VITALS — BP 132/74 | HR 70 | Temp 98.5°F | Ht 64.2 in | Wt 234.8 lb

## 2021-09-18 DIAGNOSIS — E782 Mixed hyperlipidemia: Secondary | ICD-10-CM | POA: Diagnosis not present

## 2021-09-18 DIAGNOSIS — R7303 Prediabetes: Secondary | ICD-10-CM | POA: Diagnosis not present

## 2021-09-18 DIAGNOSIS — E039 Hypothyroidism, unspecified: Secondary | ICD-10-CM | POA: Diagnosis not present

## 2021-09-18 DIAGNOSIS — N182 Chronic kidney disease, stage 2 (mild): Secondary | ICD-10-CM

## 2021-09-18 DIAGNOSIS — Z6841 Body Mass Index (BMI) 40.0 and over, adult: Secondary | ICD-10-CM | POA: Diagnosis not present

## 2021-09-18 DIAGNOSIS — I129 Hypertensive chronic kidney disease with stage 1 through stage 4 chronic kidney disease, or unspecified chronic kidney disease: Secondary | ICD-10-CM

## 2021-09-18 DIAGNOSIS — Z01818 Encounter for other preprocedural examination: Secondary | ICD-10-CM

## 2021-09-18 NOTE — Progress Notes (Signed)
I,Yamilka J Llittleton,acting as a Education administrator for [employer], FNP.,have documented all relevant documentation on the behalf of [name], FNP,as directed by  [name], FNP while in the presence of [name], [place].   This visit occurred during the SARS-CoV-2 public health emergency.  Safety protocols were in place, including screening questions prior to the visit, additional usage of staff PPE, and extensive cleaning of exam room while observing appropriate contact time as indicated for disinfecting solutions.  Subjective:     Patient ID: Nancy [name] , female    DOB: November 14, 1951 , 70 y.o.   MRN: [id number]   Chief Complaint  Patient presents with   Pre-op Exam    HPI  Patient presents today for a preop appointment. She is scheduled to have a hernia repair and panculectomy done on 10/20/21.  She is scheduled to see Cardiology October 5th for follow up.      Past Medical History:  Diagnosis Date   Hyperlipidemia    Hypertension    Prediabetes    Thyroid disease      Family History  Problem Relation Age of Onset   Diabetes Mother    Hypertension Mother    Hypertension Father    Heart disease Father    Diabetes Sister    Kidney failure Sister    Diabetes Brother    Kidney disease Brother    Diabetes Sister    Diabetes Sister    Asthma Sister    Cancer Maternal Grandfather    Cancer Paternal Grandfather      Current Outpatient Medications:    Accu-Chek Softclix Lancets lancets, Use to check blood sugars daily E11.69, Disp: 100 each, Rfl: 2   atorvastatin (LIPITOR) 20 MG tablet, TAKE 1 TABLET BY MOUTH EVERY NIGHT FOR CHOLESTEROL, Disp: 90 tablet, Rfl: 1   azelastine (OPTIVAR) 0.05 % ophthalmic solution, INSTILL ONE DROP IN EACH EYE TWICE DAILY, Disp: 6 mL, Rfl: 3   blood glucose meter kit and supplies, Dispense based on patient and insurance preference. Use up to four times daily as directed. (FOR ICD-10 E10.9, E11.9)., Disp: 1 each, Rfl: 3   Blood Glucose Monitoring  Suppl (ACCU-CHEK GUIDE) w/Device KIT, 1 Device by Does not apply route daily., Disp: 1 kit, Rfl: 0   Blood Pressure Monitor KIT, USE TO CHECK BLOOD PRESSURE 110, Disp: 1 kit, Rfl: 2   Cholecalciferol 50 MCG (2000 UT) TABS, Take 1 tablet by mouth daily., Disp: , Rfl:    clobetasol (TEMOVATE) 0.05 % external solution, Apply to scalp nightly, Disp: 50 mL, Rfl: 6   clobetasol ointment (TEMOVATE) 0.05 %, Apply to elbows knees and back behind ears nightly, Disp: 60 g, Rfl: 4   desoximetasone (TOPICORT) 0.25 % cream, Apply to skin daily safe to use on face and skin folds, Disp: 100 g, Rfl: 4   Glucosamine HCl 1500 MG TABS, Take 1 tablet by mouth daily., Disp: , Rfl:    Lactobacillus (PROBIOTIC ACIDOPHILUS PO), Take 1 tablet by mouth daily., Disp: , Rfl:    levothyroxine (SYNTHROID) 150 MCG tablet, Take 1 tablet (150 mcg total) by mouth daily before breakfast., Disp: 115 tablet, Rfl: 1   Magnesium 250 MG TABS, Take 1 tablet by mouth daily., Disp: , Rfl:    Multiple Vitamins-Minerals (MULTIVITAMIN WOMEN 50+) TABS, Take 1 tablet by mouth daily., Disp: , Rfl:    ONETOUCH ULTRA test strip, USE AS INSTRUCTED, Disp: 100 strip, Rfl: 12   spironolactone (ALDACTONE) 50 MG tablet, Take 1 tablet (50 mg  total) by mouth daily., Disp: 90 tablet, Rfl: 1   No Known Allergies   Review of Systems  Constitutional: Negative.  Negative for fatigue.  HENT: Negative.    Eyes: Negative.   Respiratory: Negative.  Negative for shortness of breath.   Cardiovascular: Negative.  Negative for chest pain and palpitations.  Endocrine: Negative for polydipsia, polyphagia and polyuria.  Musculoskeletal: Negative.   Skin: Negative.   Neurological:  Negative for dizziness and headaches.  Psychiatric/Behavioral: Negative.      Today's Vitals   09/18/21 1428  BP: 132/74  Pulse: 70  Temp: 98.5 F (36.9 C)  Weight: 234 lb 12.8 oz (106.5 kg)  Height: 5' 4.2" (1.631 m)   Body mass index is 40.05 kg/m.  Wt Readings from Last 3  Encounters:  09/18/21 234 lb 12.8 oz (106.5 kg)  05/20/21 240 lb 6.4 oz (109 kg)  02/12/21 238 lb 3.2 oz (108 kg)     Objective:  Physical Exam Constitutional:      General: She is not in acute distress.    Appearance: She is well-developed. She is obese.  Cardiovascular:     Rate and Rhythm: Normal rate and regular rhythm.     Pulses: Normal pulses.     Heart sounds: Normal heart sounds. No murmur heard. Pulmonary:     Effort: Pulmonary effort is normal. No respiratory distress.     Breath sounds: Normal breath sounds. No wheezing.  Abdominal:     General: Bowel sounds are normal. There is no distension.     Palpations: Abdomen is soft.     Tenderness: There is no abdominal tenderness.  Skin:    General: Skin is warm and dry.     Capillary Refill: Capillary refill takes less than 2 seconds.  Neurological:     General: No focal deficit present.     Mental Status: She is alert.     Cranial Nerves: No cranial nerve deficit.  Psychiatric:        Mood and Affect: Mood normal. Mood is not anxious.        Behavior: Behavior normal.        Thought Content: Thought content normal.        Judgment: Judgment normal.        Assessment And Plan:     1. Prediabetes Comments: Good control, no current medications - Hemoglobin A1c  2. Mixed hyperlipidemia Chronic, slightly elevated at last visit No current medications  3. Benign hypertension with CKD stage II B/P is controlled.  CMP ordered to check renal function.  The importance of regular exercise and dietary modification was stressed to the patient.  4. Stage 2 chronic kidney disease  5. Class 3 severe obesity due to excess calories with body mass index (BMI) of 40.0 to 44.9 in adult, unspecified whether serious comorbidity present (HCC) Chronic Discussed healthy diet and regular exercise options  Encouraged to exercise at least 150 minutes per week with 2 days of strength training  6. Acquired hypothyroidism Chronic,  stable.  Continue current medications - TSH - T4 - T3, free  7. Preop examination Comments: Pending labs will clear medically, she is to get cardiac clearance from her cardiologist.  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 HR 73 - EKG 12-Lead - CBC - CMP14+EGFR - APTT - Protime-INR    Patient was given opportunity to ask questions. Patient verbalized understanding of the plan and was able to repeat key elements of the  plan. All questions were answered to their satisfaction.  [name], FNP   I, [name], FNP, have reviewed all documentation for this visit. The documentation on 09/18/21 for the exam, diagnosis, procedures, and orders are all accurate and complete.   IF YOU HAVE BEEN REFERRED TO A SPECIALIST, IT MAY TAKE 1-2 WEEKS TO SCHEDULE/PROCESS THE REFERRAL. IF YOU HAVE NOT HEARD FROM US/SPECIALIST IN TWO WEEKS, PLEASE GIVE [place] A CALL AT [phone] X [phone].   THE PATIENT IS ENCOURAGED TO PRACTICE SOCIAL DISTANCING DUE TO THE COVID-19 PANDEMIC.

## 2021-09-19 LAB — CMP14+EGFR
ALT: 26 IU/L (ref 0–32)
AST: 33 IU/L (ref 0–40)
Albumin/Globulin Ratio: 1.6 (ref 1.2–2.2)
Albumin: 4.7 g/dL (ref 3.8–4.8)
Alkaline Phosphatase: 84 IU/L (ref 44–121)
BUN/Creatinine Ratio: 26 (ref 12–28)
BUN: 27 mg/dL (ref 8–27)
Bilirubin Total: 0.5 mg/dL (ref 0.0–1.2)
CO2: 24 mmol/L (ref 20–29)
Calcium: 10.1 mg/dL (ref 8.7–10.3)
Chloride: 102 mmol/L (ref 96–106)
Creatinine, Ser: 1.02 mg/dL — ABNORMAL HIGH (ref 0.57–1.00)
Globulin, Total: 2.9 g/dL (ref 1.5–4.5)
Glucose: 79 mg/dL (ref 70–99)
Potassium: 5 mmol/L (ref 3.5–5.2)
Sodium: 141 mmol/L (ref 134–144)
Total Protein: 7.6 g/dL (ref 6.0–8.5)
eGFR: 59 mL/min/{1.73_m2} — ABNORMAL LOW (ref >59)

## 2021-09-19 LAB — CBC
Hematocrit: 43 % (ref 34.0–46.6)
Hemoglobin: 14.1 g/dL (ref 11.1–15.9)
MCH: 29.6 pg (ref 26.6–33.0)
MCHC: 32.8 g/dL (ref 31.5–35.7)
MCV: 90 fL (ref 79–97)
Platelets: 249 10*3/uL (ref 150–450)
RBC: 4.76 x10E6/uL (ref 3.77–5.28)
RDW: 13.2 % (ref 11.7–15.4)
WBC: 6.6 10*3/uL (ref 3.4–10.8)

## 2021-09-19 LAB — T4: T4, Total: 11.3 ug/dL (ref 4.5–12.0)

## 2021-09-19 LAB — HEMOGLOBIN A1C
Est. average glucose Bld gHb Est-mCnc: 120 mg/dL
Hgb A1c MFr Bld: 5.8 % — ABNORMAL HIGH (ref 4.8–5.6)

## 2021-09-19 LAB — TSH: TSH: 0.648 u[IU]/mL (ref 0.450–4.500)

## 2021-09-19 LAB — PROTIME-INR
INR: 1 (ref 0.9–1.2)
Prothrombin Time: 10.3 s (ref 9.1–12.0)

## 2021-09-19 LAB — T3, FREE: T3, Free: 2.6 pg/mL (ref 2.0–4.4)

## 2021-09-19 LAB — APTT: aPTT: 28 s (ref 24–33)

## 2021-09-23 ENCOUNTER — Telehealth: Payer: Self-pay

## 2021-09-23 NOTE — Chronic Care Management (AMB) (Signed)
Chronic Care Management Pharmacy Assistant   Name: Nancy [name]  MRN: [id number] DOB: [birth year]   Reason for Encounter: Medication Review/ medication coordination    Recent office visits:  09-18-2021 Nancy [name], [place]. Creatinine= 1.02, eGFR= 59. A1C= 5.8  Recent consult visits:  08-05-2021 Nancy Nims, MD ([facility]). Initial consult for ventral hernia.  08-26-2021 [name], Nancy Lofty, DO (Plastic surgery). Follow up  Hospital visits:  None in previous 6 months  Medications: Outpatient Encounter Medications as of 09/23/2021  Medication Sig   Accu-Chek Softclix Lancets lancets Use to check blood sugars daily E11.69   atorvastatin (LIPITOR) 20 MG tablet TAKE 1 TABLET BY MOUTH EVERY NIGHT FOR CHOLESTEROL   azelastine (OPTIVAR) 0.05 % ophthalmic solution INSTILL ONE DROP IN EACH EYE TWICE DAILY   blood glucose meter kit and supplies Dispense based on patient and insurance preference. Use up to four times daily as directed. (FOR ICD-10 E10.9, E11.9).   Blood Glucose Monitoring Suppl (ACCU-CHEK GUIDE) w/Device KIT 1 Device by Does not apply route daily.   Blood Pressure Monitor KIT USE TO CHECK BLOOD PRESSURE 110   Cholecalciferol 50 MCG (2000 UT) TABS Take 1 tablet by mouth daily.   clobetasol (TEMOVATE) 0.05 % external solution Apply to scalp nightly   clobetasol ointment (TEMOVATE) 0.05 % Apply to elbows knees and back behind ears nightly   desoximetasone (TOPICORT) 0.25 % cream Apply to skin daily safe to use on face and skin folds   Glucosamine HCl 1500 MG TABS Take 1 tablet by mouth daily.   Lactobacillus (PROBIOTIC ACIDOPHILUS PO) Take 1 tablet by mouth daily.   levothyroxine (SYNTHROID) 150 MCG tablet Take 1 tablet (150 mcg total) by mouth daily before breakfast.   Magnesium 250 MG TABS Take 1 tablet by mouth daily.   Multiple Vitamins-Minerals (MULTIVITAMIN WOMEN 50+) TABS Take 1 tablet by mouth daily.   ONETOUCH ULTRA test strip USE AS INSTRUCTED    spironolactone (ALDACTONE) 50 MG tablet Take 1 tablet (50 mg total) by mouth daily.   No facility-administered encounter medications on file as of 09/23/2021.   Reviewed chart for medication changes ahead of medication coordination call.  No OVs, Consults, or hospital visits since last care coordination call/Pharmacist visit. (If appropriate, list visit date, provider name)  No medication changes indicated OR if recent visit, treatment plan here.  BP Readings from Last 3 Encounters:  09/18/21 132/74  05/20/21 116/73  02/12/21 128/72    Lab Results  Component Value Date   HGBA1C 5.8 (H) 09/18/2021     Patient obtains medications through Vials  90 Days   Last adherence delivery included:  Levothyroxine 150 mcg daily Atorvastatin 20 mg daily  Patient declined (meds) last month: None  Patient is due for next adherence delivery on: 10-03-2021 Called patient and reviewed medications and coordinated delivery.  This delivery to include: Atorvastatin 20 mg daily Levothyroxine 150 mcg daily Spironolactone 50 mg daily Onetouch test strips Onetouch lancets  No short or acute fill needed  Patient declined the following medications: None  Patient needs refills for: None  Confirmed delivery date of 10-03-2021 advised patient that pharmacy will contact them the morning of delivery.   Care Gaps: PNA vac Low Risk Adult- Overdue - never done Zoster Vaccines- Shingrix (2 of 2)-Last completed: Aug 29, 2020       FOOT EXAM Nancy [name] completed: Jan 11, 2020 INFLUENZA VACCINE- Due AUG 1,2022      Annual Wellness Exam completed 01/16/2021  Star Rating Drugs:  Atorvastatin 20 mg- Last filled 08-06-2021 55 [facility]  [place] Clinical Pharmacist Assistant [phone]

## 2021-09-24 ENCOUNTER — Encounter: Payer: Self-pay | Admitting: Physician Assistant

## 2021-09-24 ENCOUNTER — Other Ambulatory Visit: Payer: Self-pay

## 2021-09-24 ENCOUNTER — Ambulatory Visit: Payer: Medicare HMO | Admitting: Physician Assistant

## 2021-09-24 VITALS — BP 126/60 | HR 69 | Ht 65.0 in | Wt 229.0 lb

## 2021-09-24 DIAGNOSIS — Z01810 Encounter for preprocedural cardiovascular examination: Secondary | ICD-10-CM

## 2021-09-24 DIAGNOSIS — Z8249 Family history of ischemic heart disease and other diseases of the circulatory system: Secondary | ICD-10-CM | POA: Diagnosis not present

## 2021-09-24 DIAGNOSIS — I10 Essential (primary) hypertension: Secondary | ICD-10-CM

## 2021-09-24 DIAGNOSIS — I452 Bifascicular block: Secondary | ICD-10-CM | POA: Diagnosis not present

## 2021-09-24 NOTE — Progress Notes (Addendum)
Cardiology Office Note:    Date:  09/24/2021   ID:  Nancy [name], DOB Jun 06, 1951, MRN [id number]  PCP:  [name], FNP  [facility] Cardiologist:  Candee Furbish, MD  [facility] Electrophysiologist:  None   Chief Complaint: surgical clearance for  PANNICULECTOMY WITH HERNIA REPAIR  History of Present Illness:    Nancy [name] is a 70 y.o. female with a hx of  seen for surgical clearance.   Hx of HTN, CKD, prediabetic, CKD and HLD.  Establish care with Dr. Marlou Porch February 2021 for abnormal EKG and preop clearance.  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  Family history of father having MI in his 68s.  Follow-up stress test was low risk.  Echocardiogram showed LV function of 65 to 70% and grade 1 diastolic dysfunction.  Mild to moderate aortic valve sclerosis without stenosis.  She was cleared for surgery but never had it done.   Last echocardiogram March 2022 showed LV function of 60 to 56%, normal diastolic parameter.  No significant valvular abnormality.  Here today for surgical clearance.  She walks for 4 miles each day on treadmill without chest pain or shortness of breath.  She denies dizziness, palpitation, orthopnea, PND, syncope, lower extremity edema or melena.  Past Medical History:  Diagnosis Date   Hyperlipidemia    Hypertension    Prediabetes    Thyroid disease     Past Surgical History:  Procedure Laterality Date   BREAST SURGERY     CHOLECYSTECTOMY     REDUCTION MAMMAPLASTY     TONSILLECTOMY      Current Medications: Current Meds  Medication Sig   Accu-Chek Softclix Lancets lancets Use to check blood sugars daily E11.69   atorvastatin (LIPITOR) 20 MG tablet TAKE 1 TABLET BY MOUTH EVERY NIGHT FOR CHOLESTEROL   azelastine (OPTIVAR) 0.05 % ophthalmic solution INSTILL ONE DROP IN EACH EYE TWICE DAILY   blood glucose meter kit and supplies Dispense based on patient and insurance preference. Use up to four times daily as  directed. (FOR ICD-10 E10.9, E11.9).   Blood Glucose Monitoring Suppl (ACCU-CHEK GUIDE) w/Device KIT 1 Device by Does not apply route daily.   Blood Pressure Monitor KIT USE TO CHECK BLOOD PRESSURE 110   Cholecalciferol 50 MCG (2000 UT) TABS Take 1 tablet by mouth daily.   clobetasol (TEMOVATE) 0.05 % external solution Apply to scalp nightly   clobetasol ointment (TEMOVATE) 0.05 % Apply to elbows knees and back behind ears nightly   desoximetasone (TOPICORT) 0.25 % cream Apply to skin daily safe to use on face and skin folds   Glucosamine HCl 1500 MG TABS Take 1 tablet by mouth daily.   Lactobacillus (PROBIOTIC ACIDOPHILUS PO) Take 1 tablet by mouth daily.   levothyroxine (SYNTHROID) 150 MCG tablet Take 1 tablet (150 mcg total) by mouth daily before breakfast.   Magnesium 250 MG TABS Take 1 tablet by mouth daily.   Multiple Vitamins-Minerals (MULTIVITAMIN WOMEN 50+) TABS Take 1 tablet by mouth daily.   ONETOUCH ULTRA test strip USE AS INSTRUCTED   spironolactone (ALDACTONE) 50 MG tablet Take 1 tablet (50 mg total) by mouth daily.     Allergies:   Patient has no known allergies.   Social History   Socioeconomic History   Marital status: Unknown    Spouse name: Not on file   Number of children: Not on file   Years of education: Not on file   Highest education level: Not on file  Occupational History   Occupation: retired  Tobacco Use   Smoking status: Former   Smokeless tobacco: Never  Scientific laboratory technician Use: Never used  Substance and Sexual Activity   Alcohol use: Not Currently   Drug use: Never   Sexual activity: Not Currently  Other Topics Concern   Not on file  Social History Narrative   Not on file   Social Determinants of Health   Financial Resource Strain: Low Risk    Difficulty of Paying Living Expenses: Not hard at all  Food Insecurity: No Food Insecurity   Worried About Charity fundraiser in the Last Year: Never true   Arboriculturist in the Last Year: Never  true  Transportation Needs: No Transportation Needs   Lack of Transportation (Medical): No   Lack of Transportation (Non-Medical): No  Physical Activity: Sufficiently Active   Days of Exercise per Week: 7 days   Minutes of Exercise per Session: 120 min  Stress: No Stress Concern Present   Feeling of Stress : Not at all  Social Connections: Not on file     Family History: The patient's family history includes Asthma in her sister; Cancer in her maternal grandfather and paternal grandfather; Diabetes in her brother, mother, sister, sister, and sister; Heart disease in her father; Hypertension in her father and mother; Kidney disease in her brother; Kidney failure in her sister.    ROS:   Please see the history of present illness.    All other systems reviewed and are negative.   EKGs/Labs/Other Studies Reviewed:    The following studies were reviewed today:  Echo 02/25/21  1. Left ventricular ejection fraction, by estimation, is 60 to 65%. The  left ventricle has normal function. The left ventricle has no regional  wall motion abnormalities. Left ventricular diastolic parameters were  normal.   2. Right ventricular systolic function is normal. The right ventricular  size is normal. Tricuspid regurgitation signal is inadequate for assessing  PA pressure.   3. The mitral valve is grossly normal. Trivial mitral valve  regurgitation. No evidence of mitral stenosis.   4. The aortic valve is tricuspid. There is mild calcification of the  aortic valve. Aortic valve regurgitation is not visualized. Mild aortic  valve sclerosis is present, with no evidence of aortic valve stenosis.   5. The inferior vena cava is normal in size with greater than 50%  respiratory variability, suggesting right atrial pressure of 3 mmHg.   Comparison(s): No significant change from prior study. Aortic sclerosis  remains without evidence of stenosis.   Echo 01/2020 Nuclear stress EF: 64%. There was no ST  segment deviation noted during stress. This is a low risk study. The left ventricular ejection fraction is normal (55-65%).   There is a small fixed defect of moderate severity present in the apical and inferoapical regions, and mild fixed defect in the anterior, apical septal, apical lateral location. Improved on stress as compared to rest. Wall motion normal at the apex. This finding likely represents artifact, however cannot exclude prior infarct. Study is overall low risk.   EKG:  EKG is not  ordered today.  The ekg  done demonstrates 09/18/21 showed sinus rhythm, right bundle branch Block and left anterior fascicular block (based on note, no actual EKG available for review)  Recent Labs: 09/18/2021: ALT 26; BUN 27; Creatinine, Ser 1.02; Hemoglobin 14.1; Platelets 249; Potassium 5.0; Sodium 141; TSH 0.648  Recent Lipid Panel    Component  Value Date/Time   CHOL 216 (H) 02/12/2021 1412   TRIG 79 02/12/2021 1412   HDL 69 02/12/2021 1412   CHOLHDL 3.1 02/12/2021 1412   LDLCALC 133 (H) 02/12/2021 1412    Physical Exam:    VS:  BP 126/60   Pulse 69   Ht $R'5\' 5"'WN$  (1.651 m)   Wt 229 lb (103.9 kg)   SpO2 99%   BMI 38.11 kg/m     Wt Readings from Last 3 Encounters:  09/24/21 229 lb (103.9 kg)  09/18/21 234 lb 12.8 oz (106.5 kg)  05/20/21 240 lb 6.4 oz (109 kg)     GEN:  Well nourished, well developed in no acute distress HEENT: Normal NECK: No JVD; No carotid bruits LYMPHATICS: No lymphadenopathy CARDIAC: RRR, no murmurs, rubs, gallops RESPIRATORY:  Clear to auscultation without rales, wheezing or rhonchi  ABDOMEN: Soft, non-tender, non-distended MUSCULOSKELETAL:  No edema; No deformity  SKIN: Warm and dry NEUROLOGIC:  Alert and oriented x 3 PSYCHIATRIC:  Normal affect   ASSESSMENT AND PLAN:    Hypertension Blood pressure stable and controlled on current medications.  2.  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  No syncope.  Recent  echocardiogram with normal LV function.  No significant valvular abnormality.  3.  Surgical clearance Patient is easily getting greater than 4 METS of activity.  Given past medical history and time since last visit, based on ACC/AHA guidelines, Shawndra Clute would be at acceptable risk for the planned procedure without further cardiovascular testing.   The patient was advised that if she develops new symptoms prior to surgery to contact our office to arrange for a follow-up visit, and she verbalized understanding.  I will route this recommendation to the requesting party via Epic fax function and remove from pre-op pool.  Please call with questions.  4. HLD -Continue statin. 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  Concerns regarding medicines are outlined above.  No orders of the defined types were placed in this encounter.  No orders of the defined types were placed in this encounter.   Patient Instructions  Medication Instructions:  Your physician recommends that you continue on your current medications as directed. Please refer to the Current Medication list given to you today.  *If you need a refill on your cardiac medications before your next appointment, please call your pharmacy*   Lab Work: None ordered  If you have labs (blood work) drawn today and your tests are completely normal, you will receive your results only by: [place] (if you have MyChart) OR A paper copy in the mail If you have any lab test that is abnormal or we need to change your treatment, we will call you to review the results.   Testing/Procedures: None ordered   Follow-Up: At [facility], you and your health needs are our priority.  As part of our continuing mission to  provide you with exceptional heart care, we have created designated Provider Care Teams.  These Care Teams include your primary Cardiologist (physician) and Advanced Practice Providers (APPs -  Physician Assistants and Nurse Practitioners) who all work together to provide you with the care you need, when you need it.  We recommend signing up for the patient portal called "MyChart".  Sign up information is  provided on this After Visit Summary.  MyChart is used to connect with patients for Virtual Visits (Telemedicine).  Patients are able to view lab/test results, encounter notes, upcoming appointments, etc.  Non-urgent messages can be sent to your provider as well.   To learn more about what you can do with MyChart, go to [email or web address].    Your next appointment:   12 month(s)  The format for your next appointment:   In Person  Provider:   You may see Candee Furbish, MD or one of the following Advanced Practice Providers on your designated Care Team:   Cecilie Kicks, NP    Other Instructions    Signed, Leanor Kail, [place]  09/24/2021 2:32 PM    [place]

## 2021-09-24 NOTE — Patient Instructions (Signed)
Medication Instructions:  Your physician recommends that you continue on your current medications as directed. Please refer to the Current Medication list given to you today.  *If you need a refill on your cardiac medications before your next appointment, please call your pharmacy*   Lab Work: None ordered  If you have labs (blood work) drawn today and your tests are completely normal, you will receive your results only by: [place] (if you have MyChart) OR A paper copy in the mail If you have any lab test that is abnormal or we need to change your treatment, we will call you to review the results.   Testing/Procedures: None ordered   Follow-Up: At [facility], you and your health needs are our priority.  As part of our continuing mission to provide you with exceptional heart care, we have created designated Provider Care Teams.  These Care Teams include your primary Cardiologist (physician) and Advanced Practice Providers (APPs -  Physician Assistants and Nurse Practitioners) who all work together to provide you with the care you need, when you need it.  We recommend signing up for the patient portal called "MyChart".  Sign up information is provided on this After Visit Summary.  MyChart is used to connect with patients for Virtual Visits (Telemedicine).  Patients are able to view lab/test results, encounter notes, upcoming appointments, etc.  Non-urgent messages can be sent to your provider as well.   To learn more about what you can do with MyChart, go to [email or web address].    Your next appointment:   12 month(s)  The format for your next appointment:   In Person  Provider:   You may see Candee Furbish, MD or one of the following Advanced Practice Providers on your designated Care Team:   Cecilie Kicks, NP    Other Instructions

## 2021-09-25 ENCOUNTER — Telehealth: Payer: Self-pay

## 2021-09-25 NOTE — Telephone Encounter (Signed)
The pt was notified that her EKG image has now been uploaded to her chart, that I spoke with Rico Junker at [facility] and notified her this morning that it would be done.

## 2021-09-25 NOTE — Telephone Encounter (Signed)
I was on a call with another patient when this call came through from patient so operator took a message. Per chart review it looks like patient saw Vin Bhagat yesterday in clinic for pre-op evaluation. Primary care note today indicates they spoke with Jeanann Lewandowsky this AM. Will route to Vin/Jennifer to address. (Separate preop APP input not needed at this time.)

## 2021-09-25 NOTE — Telephone Encounter (Signed)
Pt was contacted by her PCP and was advised to contact our office and speak with the Pre Op Team concerning her Pre Op Clearance  [phone]

## 2021-09-30 ENCOUNTER — Other Ambulatory Visit: Payer: Self-pay

## 2021-09-30 ENCOUNTER — Encounter: Payer: Self-pay | Admitting: Physician Assistant

## 2021-09-30 ENCOUNTER — Ambulatory Visit: Payer: Medicare HMO | Admitting: Physician Assistant

## 2021-09-30 VITALS — BP 113/73 | HR 80 | Ht 65.0 in | Wt 226.8 lb

## 2021-09-30 DIAGNOSIS — M793 Panniculitis, unspecified: Secondary | ICD-10-CM

## 2021-09-30 MED ORDER — HYDROCODONE-ACETAMINOPHEN 5-325 MG PO TABS: 1.0000 | ORAL_TABLET | Freq: Four times a day (QID) | ORAL | 0 refills | Status: AC | PRN

## 2021-09-30 MED ORDER — CEPHALEXIN 500 MG PO CAPS: 500.0000 mg | ORAL_CAPSULE | Freq: Four times a day (QID) | ORAL | 0 refills | Status: AC

## 2021-09-30 MED ORDER — ONDANSETRON 4 MG PO TBDP: 4.0000 mg | ORAL_TABLET | Freq: Three times a day (TID) | ORAL | 0 refills | Status: DC | PRN

## 2021-09-30 NOTE — Progress Notes (Signed)
Patient ID: Nancy [name], female    DOB: May 28, 1951, 70 y.o.   MRN: [id number]  Chief Complaint  Patient presents with   Pre-op Exam            ICD-10-CM   1. Panniculitis  M79.3        History of Present Illness: Nancy [name] is a 70 y.o.  female  with a history of panniculitis.  She presents for preoperative evaluation for upcoming procedure, panniculectomy, scheduled for 10/20/2021 with Dr. Marla Roe.  Patient has concurrent surgery that day with Dr. Ninfa Linden for hernia repair.  The patient has had problems with anesthesia.  She tells me that when she had her TNA performed, she reportedly woke up during surgery however was paralyzed and unable to communicate with surgeons that she was awake.  She states that this did not occur during her next surgery, cholecystectomy.  I reminded her that this is an elective procedure, but she expressed that she would very much like to continue with surgery despite knowing that we cannot guarantee that a similar event will occur.  She will mention this to anesthesiology during preoperative exam on day of surgery.  She denies any personal history of DVTs, but states that her siblings have each had DVTs.  She believes that they are provoked.  No family history of clotting disorder.  She has had preoperative clearance from both her cardiologist as well as her primary care provider.  Prior history of tobacco use, but non-smoker for approximately the past 20 years.  Denies history of COPD or oxygen requirement.  Denies history of varicosities.  No recent hospitalizations.  She is on Synthroid, but no other hormone replacement medication.  Endorses nausea in context of narcotic medications, but NKDA.   Summary of Previous Visit: Patient had initially been seen for consult on 05/20/2021 by Dr. Marla Roe.  At that time, was noted that she has had frequent breakdowns of her skin around her abdominal area, likely related to the moisture and chafing from large  pannus.  Plan was for panniculectomy with possible liposuction.  Plan is for her to have preoperative clearance from her primary care provider.  I reviewed her medical record and she has since had preoperative clearance performed by both her primary care provider as well as her cardiologist.  [place] Significant for: HTN, HLD, prediabetes, panniculitis.  She tells me that her most recent A1c just a few weeks ago was 5.8.   Past Medical History: Allergies: No Known Allergies  Current Medications:  Current Outpatient Medications:    Accu-Chek Softclix Lancets lancets, Use to check blood sugars daily E11.69, Disp: 100 each, Rfl: 2   atorvastatin (LIPITOR) 20 MG tablet, TAKE 1 TABLET BY MOUTH EVERY NIGHT FOR CHOLESTEROL, Disp: 90 tablet, Rfl: 1   azelastine (OPTIVAR) 0.05 % ophthalmic solution, INSTILL ONE DROP IN EACH EYE TWICE DAILY, Disp: 6 mL, Rfl: 3   blood glucose meter kit and supplies, Dispense based on patient and insurance preference. Use up to four times daily as directed. (FOR ICD-10 E10.9, E11.9)., Disp: 1 each, Rfl: 3   Blood Glucose Monitoring Suppl (ACCU-CHEK GUIDE) w/Device KIT, 1 Device by Does not apply route daily., Disp: 1 kit, Rfl: 0   Blood Pressure Monitor KIT, USE TO CHECK BLOOD PRESSURE 110, Disp: 1 kit, Rfl: 2   Cholecalciferol 50 MCG (2000 UT) TABS, Take 1 tablet by mouth daily., Disp: , Rfl:    clobetasol (TEMOVATE) 0.05 % external solution, Apply to scalp nightly,  Disp: 50 mL, Rfl: 6   clobetasol ointment (TEMOVATE) 0.05 %, Apply to elbows knees and back behind ears nightly, Disp: 60 g, Rfl: 4   desoximetasone (TOPICORT) 0.25 % cream, Apply to skin daily safe to use on face and skin folds, Disp: 100 g, Rfl: 4   Glucosamine HCl 1500 MG TABS, Take 1 tablet by mouth daily., Disp: , Rfl:    Lactobacillus (PROBIOTIC ACIDOPHILUS PO), Take 1 tablet by mouth daily., Disp: , Rfl:    levothyroxine (SYNTHROID) 150 MCG tablet, Take 1 tablet (150 mcg total) by mouth daily before  breakfast., Disp: 115 tablet, Rfl: 1   Magnesium 250 MG TABS, Take 1 tablet by mouth daily., Disp: , Rfl:    Multiple Vitamins-Minerals (MULTIVITAMIN WOMEN 50+) TABS, Take 1 tablet by mouth daily., Disp: , Rfl:    ONETOUCH ULTRA test strip, USE AS INSTRUCTED, Disp: 100 strip, Rfl: 12   spironolactone (ALDACTONE) 50 MG tablet, Take 1 tablet (50 mg total) by mouth daily., Disp: 90 tablet, Rfl: 1  Past Medical Problems: Past Medical History:  Diagnosis Date   Hyperlipidemia    Hypertension    Prediabetes    Thyroid disease     Past Surgical History: Past Surgical History:  Procedure Laterality Date   BREAST SURGERY     CHOLECYSTECTOMY     REDUCTION MAMMAPLASTY     TONSILLECTOMY      Social History: Social History   Socioeconomic History   Marital status: Unknown    Spouse name: Not on file   Number of children: Not on file   Years of education: Not on file   Highest education level: Not on file  Occupational History   Occupation: retired  Tobacco Use   Smoking status: Former   Smokeless tobacco: Never  Scientific laboratory technician Use: Never used  Substance and Sexual Activity   Alcohol use: Not Currently   Drug use: Never   Sexual activity: Not Currently  Other Topics Concern   Not on file  Social History Narrative   Not on file   Social Determinants of Health   Financial Resource Strain: Low Risk    Difficulty of Paying Living Expenses: Not hard at all  Food Insecurity: No Food Insecurity   Worried About Charity fundraiser in the Last Year: Never true   Arboriculturist in the Last Year: Never true  Transportation Needs: No Transportation Needs   Lack of Transportation (Medical): No   Lack of Transportation (Non-Medical): No  Physical Activity: Sufficiently Active   Days of Exercise per Week: 7 days   Minutes of Exercise per Session: 120 min  Stress: No Stress Concern Present   Feeling of Stress : Not at all  Social Connections: Not on file  Intimate Partner  Violence: Not on file    Family History: Family History  Problem Relation Age of Onset   Diabetes Mother    Hypertension Mother    Hypertension Father    Heart disease Father    Diabetes Sister    Kidney failure Sister    Diabetes Brother    Kidney disease Brother    Diabetes Sister    Diabetes Sister    Asthma Sister    Cancer Maternal Grandfather    Cancer Paternal Grandfather     Review of Systems: Review of Systems  Constitutional:  Negative for fever.   Physical Exam: Vital Signs BP 113/73 (BP Location: Left Arm, Patient Position: Sitting, Cuff Size: Large)  Pulse 80   Ht _0  (1.651 m)   Wt 226 lb 12.8 oz (102.9 kg)   SpO2 100%   BMI 37.74 kg/m   Physical Exam  Constitutional:      General: Not in acute distress.    Appearance: Normal appearance. Not ill-appearing.  HENT:     Head: Normocephalic and atraumatic.  Eyes:     Pupils: Pupils are equal, round Neck:     Musculoskeletal: Normal range of motion.  Cardiovascular:     Rate and Rhythm: Normal rate    Pulses: Normal pulses.  Pulmonary:     Effort: Pulmonary effort is normal. No respiratory distress.  Abdominal:     General: Abdomen is flat. There is no distension.  Musculoskeletal: Normal range of motion.  No lower extremity swelling or edema.  No varicosities noted.  Peripheral pulses all intact and symmetric. Skin:    General: Skin is warm and dry.     Findings: No erythema or rash.  Neurological:     General: No focal deficit present.     Mental Status: Alert and oriented to person, place, and time. Mental status is at baseline.     Motor: No weakness.  Psychiatric:        Mood and Affect: Mood normal.        Behavior: Behavior normal.    Assessment/Plan: The patient is scheduled for panniculectomy 10/20/2021 with Dr. Marla Roe.  Risks, benefits, and alternatives of procedure discussed, questions answered and consent obtained.    Smoking Status: Previous smoker.  Stopped 20 years  ago.  Caprini Score: 8, high; Risk Factors include: Age, BMI greater than 25, family history of thrombosis, and length of planned surgery. Recommendation for mechanical and possibly pharmacological prophylaxis. Will discuss with surgeon.  Encourage early ambulation.   Pictures obtained: 05/20/2021.  Post-op Rx sent to pharmacy: Keflex, Zofran, Norco.  Patient was provided with the General Surgical Risk consent document and Pain Medication Agreement prior to their appointment.  They had adequate time to read through the risk consent documents and Pain Medication Agreement. We also discussed them in person together during this preop appointment. All of their questions were answered to their satisfaction.  Recommended calling if they have any further questions.  Risk consent form and Pain Medication Agreement to be scanned into patient's chart.  The risk that can be encountered for this procedure were discussed and include the following but not limited to these: asymmetry, fluid accumulation, firmness of the tissue, skin loss, decrease or no sensation, fat necrosis, bleeding, infection, healing delay.  Deep vein thrombosis, cardiac and pulmonary complications are risks to any procedure.  There are risks of anesthesia, changes to skin sensation and injury to nerves or blood vessels.  The muscle can be temporarily or permanently injured.  You may have an allergic reaction to tape, suture, glue, blood products which can result in skin discoloration, swelling, pain, skin lesions, poor healing.  Any of these can lead to the need for revisonal surgery or stage procedures.  Weight gain and weigh loss can also effect the long term appearance. The results are not guaranteed to last a lifetime.  Future surgery may be required.     Electronically signed by: Krista Blue, PA-C 09/30/2021 3:11 PM

## 2021-09-30 NOTE — H&P (View-Only) (Signed)
Patient ID: Nancy [name], female    DOB: May 28, 1951, 70 y.o.   MRN: [id number]  Chief Complaint  Patient presents with   Pre-op Exam            ICD-10-CM   1. Panniculitis  M79.3        History of Present Illness: Nancy [name] is a 70 y.o.  female  with a history of panniculitis.  She presents for preoperative evaluation for upcoming procedure, panniculectomy, scheduled for 10/20/2021 with Dr. Marla Roe.  Patient has concurrent surgery that day with Dr. Ninfa Linden for hernia repair.  The patient has had problems with anesthesia.  She tells me that when she had her TNA performed, she reportedly woke up during surgery however was paralyzed and unable to communicate with surgeons that she was awake.  She states that this did not occur during her next surgery, cholecystectomy.  I reminded her that this is an elective procedure, but she expressed that she would very much like to continue with surgery despite knowing that we cannot guarantee that a similar event will occur.  She will mention this to anesthesiology during preoperative exam on day of surgery.  She denies any personal history of DVTs, but states that her siblings have each had DVTs.  She believes that they are provoked.  No family history of clotting disorder.  She has had preoperative clearance from both her cardiologist as well as her primary care provider.  Prior history of tobacco use, but non-smoker for approximately the past 20 years.  Denies history of COPD or oxygen requirement.  Denies history of varicosities.  No recent hospitalizations.  She is on Synthroid, but no other hormone replacement medication.  Endorses nausea in context of narcotic medications, but NKDA.   Summary of Previous Visit: Patient had initially been seen for consult on 05/20/2021 by Dr. Marla Roe.  At that time, was noted that she has had frequent breakdowns of her skin around her abdominal area, likely related to the moisture and chafing from large  pannus.  Plan was for panniculectomy with possible liposuction.  Plan is for her to have preoperative clearance from her primary care provider.  I reviewed her medical record and she has since had preoperative clearance performed by both her primary care provider as well as her cardiologist.  [place] Significant for: HTN, HLD, prediabetes, panniculitis.  She tells me that her most recent A1c just a few weeks ago was 5.8.   Past Medical History: Allergies: No Known Allergies  Current Medications:  Current Outpatient Medications:    Accu-Chek Softclix Lancets lancets, Use to check blood sugars daily E11.69, Disp: 100 each, Rfl: 2   atorvastatin (LIPITOR) 20 MG tablet, TAKE 1 TABLET BY MOUTH EVERY NIGHT FOR CHOLESTEROL, Disp: 90 tablet, Rfl: 1   azelastine (OPTIVAR) 0.05 % ophthalmic solution, INSTILL ONE DROP IN EACH EYE TWICE DAILY, Disp: 6 mL, Rfl: 3   blood glucose meter kit and supplies, Dispense based on patient and insurance preference. Use up to four times daily as directed. (FOR ICD-10 E10.9, E11.9)., Disp: 1 each, Rfl: 3   Blood Glucose Monitoring Suppl (ACCU-CHEK GUIDE) w/Device KIT, 1 Device by Does not apply route daily., Disp: 1 kit, Rfl: 0   Blood Pressure Monitor KIT, USE TO CHECK BLOOD PRESSURE 110, Disp: 1 kit, Rfl: 2   Cholecalciferol 50 MCG (2000 UT) TABS, Take 1 tablet by mouth daily., Disp: , Rfl:    clobetasol (TEMOVATE) 0.05 % external solution, Apply to scalp nightly,  Disp: 50 mL, Rfl: 6   clobetasol ointment (TEMOVATE) 0.05 %, Apply to elbows knees and back behind ears nightly, Disp: 60 g, Rfl: 4   desoximetasone (TOPICORT) 0.25 % cream, Apply to skin daily safe to use on face and skin folds, Disp: 100 g, Rfl: 4   Glucosamine HCl 1500 MG TABS, Take 1 tablet by mouth daily., Disp: , Rfl:    Lactobacillus (PROBIOTIC ACIDOPHILUS PO), Take 1 tablet by mouth daily., Disp: , Rfl:    levothyroxine (SYNTHROID) 150 MCG tablet, Take 1 tablet (150 mcg total) by mouth daily before  breakfast., Disp: 115 tablet, Rfl: 1   Magnesium 250 MG TABS, Take 1 tablet by mouth daily., Disp: , Rfl:    Multiple Vitamins-Minerals (MULTIVITAMIN WOMEN 50+) TABS, Take 1 tablet by mouth daily., Disp: , Rfl:    ONETOUCH ULTRA test strip, USE AS INSTRUCTED, Disp: 100 strip, Rfl: 12   spironolactone (ALDACTONE) 50 MG tablet, Take 1 tablet (50 mg total) by mouth daily., Disp: 90 tablet, Rfl: 1  Past Medical Problems: Past Medical History:  Diagnosis Date   Hyperlipidemia    Hypertension    Prediabetes    Thyroid disease     Past Surgical History: Past Surgical History:  Procedure Laterality Date   BREAST SURGERY     CHOLECYSTECTOMY     REDUCTION MAMMAPLASTY     TONSILLECTOMY      Social History: Social History   Socioeconomic History   Marital status: Unknown    Spouse name: Not on file   Number of children: Not on file   Years of education: Not on file   Highest education level: Not on file  Occupational History   Occupation: retired  Tobacco Use   Smoking status: Former   Smokeless tobacco: Never  Scientific laboratory technician Use: Never used  Substance and Sexual Activity   Alcohol use: Not Currently   Drug use: Never   Sexual activity: Not Currently  Other Topics Concern   Not on file  Social History Narrative   Not on file   Social Determinants of Health   Financial Resource Strain: Low Risk    Difficulty of Paying Living Expenses: Not hard at all  Food Insecurity: No Food Insecurity   Worried About Charity fundraiser in the Last Year: Never true   Arboriculturist in the Last Year: Never true  Transportation Needs: No Transportation Needs   Lack of Transportation (Medical): No   Lack of Transportation (Non-Medical): No  Physical Activity: Sufficiently Active   Days of Exercise per Week: 7 days   Minutes of Exercise per Session: 120 min  Stress: No Stress Concern Present   Feeling of Stress : Not at all  Social Connections: Not on file  Intimate Partner  Violence: Not on file    Family History: Family History  Problem Relation Age of Onset   Diabetes Mother    Hypertension Mother    Hypertension Father    Heart disease Father    Diabetes Sister    Kidney failure Sister    Diabetes Brother    Kidney disease Brother    Diabetes Sister    Diabetes Sister    Asthma Sister    Cancer Maternal Grandfather    Cancer Paternal Grandfather     Review of Systems: Review of Systems  Constitutional:  Negative for fever.   Physical Exam: Vital Signs BP 113/73 (BP Location: Left Arm, Patient Position: Sitting, Cuff Size: Large)  Pulse 80   Ht _0  (1.651 m)   Wt 226 lb 12.8 oz (102.9 kg)   SpO2 100%   BMI 37.74 kg/m   Physical Exam  Constitutional:      General: Not in acute distress.    Appearance: Normal appearance. Not ill-appearing.  HENT:     Head: Normocephalic and atraumatic.  Eyes:     Pupils: Pupils are equal, round Neck:     Musculoskeletal: Normal range of motion.  Cardiovascular:     Rate and Rhythm: Normal rate    Pulses: Normal pulses.  Pulmonary:     Effort: Pulmonary effort is normal. No respiratory distress.  Abdominal:     General: Abdomen is flat. There is no distension.  Musculoskeletal: Normal range of motion.  No lower extremity swelling or edema.  No varicosities noted.  Peripheral pulses all intact and symmetric. Skin:    General: Skin is warm and dry.     Findings: No erythema or rash.  Neurological:     General: No focal deficit present.     Mental Status: Alert and oriented to person, place, and time. Mental status is at baseline.     Motor: No weakness.  Psychiatric:        Mood and Affect: Mood normal.        Behavior: Behavior normal.    Assessment/Plan: The patient is scheduled for panniculectomy 10/20/2021 with Dr. Marla Roe.  Risks, benefits, and alternatives of procedure discussed, questions answered and consent obtained.    Smoking Status: Previous smoker.  Stopped 20 years  ago.  Caprini Score: 8, high; Risk Factors include: Age, BMI greater than 25, family history of thrombosis, and length of planned surgery. Recommendation for mechanical and possibly pharmacological prophylaxis. Will discuss with surgeon.  Encourage early ambulation.   Pictures obtained: 05/20/2021.  Post-op Rx sent to pharmacy: Keflex, Zofran, Norco.  Patient was provided with the General Surgical Risk consent document and Pain Medication Agreement prior to their appointment.  They had adequate time to read through the risk consent documents and Pain Medication Agreement. We also discussed them in person together during this preop appointment. All of their questions were answered to their satisfaction.  Recommended calling if they have any further questions.  Risk consent form and Pain Medication Agreement to be scanned into patient's chart.  The risk that can be encountered for this procedure were discussed and include the following but not limited to these: asymmetry, fluid accumulation, firmness of the tissue, skin loss, decrease or no sensation, fat necrosis, bleeding, infection, healing delay.  Deep vein thrombosis, cardiac and pulmonary complications are risks to any procedure.  There are risks of anesthesia, changes to skin sensation and injury to nerves or blood vessels.  The muscle can be temporarily or permanently injured.  You may have an allergic reaction to tape, suture, glue, blood products which can result in skin discoloration, swelling, pain, skin lesions, poor healing.  Any of these can lead to the need for revisonal surgery or stage procedures.  Weight gain and weigh loss can also effect the long term appearance. The results are not guaranteed to last a lifetime.  Future surgery may be required.     Electronically signed by: Krista Blue, PA-C 09/30/2021 3:11 PM

## 2021-10-03 ENCOUNTER — Encounter: Payer: Self-pay | Admitting: Physician Assistant

## 2021-10-03 NOTE — Progress Notes (Signed)
Surgical clearance received from patient's primary care provider, Laurance Flatten FNP.  She has also received clearance from her cardiologist, Dr. Crista Luria.

## 2021-10-15 NOTE — Pre-Procedure Instructions (Addendum)
Surgical Instructions   Your procedure is scheduled on Monday, October 31st. Report to [facility] Main Entrance "A" at 05:30 A.M., then check in with the Admitting office. Call this number if you have problems the morning of surgery: [phone]   If you have any questions prior to your surgery date call [phone]: Open Monday-Friday 8am-4pm   Remember: Do not eat or drink after midnight the night before your surgery      Take these medicines the morning of surgery with A SIP OF WATER  azelastine (OPTIVAR) 0.05 % ophthalmic solution levothyroxine (SYNTHROID)  ondansetron (ZOFRAN ODT)- if needed   As of today, STOP taking any Aspirin (unless otherwise instructed by your surgeon) Aleve, Naproxen, Ibuprofen, Motrin, Advil, Goody's, BC's, all herbal medications, fish oil, and all vitamins.                     Do NOT Smoke (Tobacco/Vaping) or drink Alcohol 24 hours prior to your procedure.  If you use a CPAP at night, you may bring all equipment for your overnight stay.   Contacts, glasses, piercing's, hearing aid's, dentures or partials may not be worn into surgery, please bring cases for these belongings.    For patients admitted to the hospital, discharge time will be determined by your treatment team.   Patients discharged the day of surgery will not be allowed to drive home, and someone needs to stay with them for 24 hours.  NO VISITORS WILL BE ALLOWED IN PRE-OP WHERE PATIENTS GET READY FOR SURGERY.  ONLY 1 SUPPORT PERSON MAY BE PRESENT IN THE WAITING ROOM WHILE YOU ARE IN SURGERY.  IF YOU ARE TO BE ADMITTED, ONCE YOU ARE IN YOUR ROOM YOU WILL BE ALLOWED TWO (2) VISITORS.  Minor children may have two parents present. Special consideration for safety and communication needs will be reviewed on a case by case basis.   Special instructions:   [place]- Preparing For Surgery  Before surgery, you can play an important role. Because skin is not sterile, your skin needs to be  as free of germs as possible. You can reduce the number of germs on your skin by washing with CHG (chlorahexidine gluconate) Soap before surgery.  CHG is an antiseptic cleaner which kills germs and bonds with the skin to continue killing germs even after washing.    Oral Hygiene is also important to reduce your risk of infection.  Remember - BRUSH YOUR TEETH THE MORNING OF SURGERY WITH YOUR REGULAR TOOTHPASTE  Please do not use if you have an allergy to CHG or antibacterial soaps. If your skin becomes reddened/irritated stop using the CHG.  Do not shave (including legs and underarms) for at least 48 hours prior to first CHG shower. It is OK to shave your face.  Please follow these instructions carefully.   Shower the NIGHT BEFORE SURGERY and the MORNING OF SURGERY  If you chose to wash your hair, wash your hair first as usual with your normal shampoo.  After you shampoo, rinse your hair and body thoroughly to remove the shampoo.  Use CHG Soap as you would any other liquid soap. You can apply CHG directly to the skin and wash gently with a scrungie or a clean washcloth.   Apply the CHG Soap to your body ONLY FROM THE NECK DOWN.  Do not use on open wounds or open sores. Avoid contact with your eyes, ears, mouth and genitals (private parts). Wash Face and genitals (private parts)  with your normal soap.   Wash thoroughly, paying special attention to the area where your surgery will be performed.  Thoroughly rinse your body with warm water from the neck down.  DO NOT shower/wash with your normal soap after using and rinsing off the CHG Soap.  Pat yourself dry with a CLEAN TOWEL.  Wear CLEAN PAJAMAS to bed the night before surgery  Place CLEAN SHEETS on your bed the night before your surgery  DO NOT SLEEP WITH PETS.   Day of Surgery: Shower with CHG soap. Do not wear jewelry, make up, nail polish, gel polish, artificial nails, or any other type of covering on natural nails including  finger and toenails. If patients have artificial nails, gel coating, etc. that need to be removed by a nail salon please have this removed prior to surgery. Surgery may need to be canceled/delayed if the surgeon/ anesthesia feels like the patient is unable to be adequately monitored. Do not wear lotions, powders, perfumes or deodorant. Do not shave 48 hours prior to surgery.   Do not bring valuables to the hospital. [facility] is not responsible for any belongings or valuables. Wear Clean/Comfortable clothing the morning of surgery Remember to brush your teeth WITH YOUR REGULAR TOOTHPASTE.   Please read over the following fact sheets that you were given.   3 days prior to your procedure or After your COVID test   You are not required to quarantine however you are required to wear a well-fitting mask when you are out and around people not in your household. If your mask becomes wet or soiled, replace with a new one.   Wash your hands often with soap and water for 20 seconds or clean your hands with an alcohol-based hand sanitizer that contains at least 60% alcohol.   Do not share personal items.   Notify your provider:  o if you are in close contact with someone who has COVID  o or if you develop a fever of 100.4 or greater, sneezing, cough, sore throat, shortness of breath or body aches.

## 2021-10-16 ENCOUNTER — Encounter (HOSPITAL_COMMUNITY)
Admission: RE | Admit: 2021-10-16 | Discharge: 2021-10-16 | Disposition: A | Discharge status: Home / Self Care | Payer: Medicare HMO | Source: Ambulatory Visit | Attending: Plastic Surgery | Admitting: Plastic Surgery

## 2021-10-16 ENCOUNTER — Other Ambulatory Visit: Payer: Self-pay

## 2021-10-16 ENCOUNTER — Encounter (HOSPITAL_COMMUNITY): Payer: Self-pay

## 2021-10-16 ENCOUNTER — Other Ambulatory Visit (HOSPITAL_COMMUNITY): Payer: Medicare HMO

## 2021-10-16 VITALS — BP 115/57 | HR 55 | Temp 98.7°F | Resp 18 | Ht 65.0 in | Wt 227.0 lb

## 2021-10-16 DIAGNOSIS — Z01812 Encounter for preprocedural laboratory examination: Secondary | ICD-10-CM | POA: Insufficient documentation

## 2021-10-16 DIAGNOSIS — E785 Hyperlipidemia, unspecified: Secondary | ICD-10-CM | POA: Insufficient documentation

## 2021-10-16 DIAGNOSIS — Z79899 Other long term (current) drug therapy: Secondary | ICD-10-CM | POA: Diagnosis not present

## 2021-10-16 DIAGNOSIS — M793 Panniculitis, unspecified: Secondary | ICD-10-CM | POA: Diagnosis not present

## 2021-10-16 DIAGNOSIS — Z87891 Personal history of nicotine dependence: Secondary | ICD-10-CM | POA: Insufficient documentation

## 2021-10-16 DIAGNOSIS — I10 Essential (primary) hypertension: Secondary | ICD-10-CM | POA: Insufficient documentation

## 2021-10-16 DIAGNOSIS — R7303 Prediabetes: Secondary | ICD-10-CM | POA: Insufficient documentation

## 2021-10-16 DIAGNOSIS — D649 Anemia, unspecified: Secondary | ICD-10-CM

## 2021-10-16 DIAGNOSIS — Z20822 Contact with and (suspected) exposure to covid-19: Secondary | ICD-10-CM | POA: Insufficient documentation

## 2021-10-16 DIAGNOSIS — E039 Hypothyroidism, unspecified: Secondary | ICD-10-CM | POA: Diagnosis not present

## 2021-10-16 DIAGNOSIS — Z01818 Encounter for other preprocedural examination: Secondary | ICD-10-CM

## 2021-10-16 HISTORY — DX: Nonspecific intraventricular block: I45.4

## 2021-10-16 HISTORY — DX: Other complications of anesthesia, initial encounter: T88.59XA

## 2021-10-16 LAB — BASIC METABOLIC PANEL
Anion gap: 9 (ref 5–15)
BUN: 27 mg/dL — ABNORMAL HIGH (ref 8–23)
CO2: 27 mmol/L (ref 22–32)
Calcium: 9.7 mg/dL (ref 8.9–10.3)
Chloride: 104 mmol/L (ref 98–111)
Creatinine, Ser: 1.08 mg/dL — ABNORMAL HIGH (ref 0.44–1.00)
GFR, Estimated: 55 mL/min — ABNORMAL LOW (ref >60)
Glucose, Bld: 117 mg/dL — ABNORMAL HIGH (ref 70–99)
Potassium: 4 mmol/L (ref 3.5–5.1)
Sodium: 140 mmol/L (ref 135–145)

## 2021-10-16 LAB — CBC
HCT: 46.2 % — ABNORMAL HIGH (ref 36.0–46.0)
Hemoglobin: 14.6 g/dL (ref 12.0–15.0)
MCH: 30 pg (ref 26.0–34.0)
MCHC: 31.6 g/dL (ref 30.0–36.0)
MCV: 94.9 fL (ref 80.0–100.0)
Platelets: 211 10*3/uL (ref 150–400)
RBC: 4.87 MIL/uL (ref 3.87–5.11)
RDW: 13.4 % (ref 11.5–15.5)
WBC: 5.3 10*3/uL (ref 4.0–10.5)
nRBC: 0 % (ref 0.0–0.2)

## 2021-10-16 LAB — GLUCOSE, CAPILLARY
Glucose-Capillary: 64 mg/dL — ABNORMAL LOW (ref 70–99)
Glucose-Capillary: 122 mg/dL — ABNORMAL HIGH (ref 70–99)

## 2021-10-16 LAB — SARS CORONAVIRUS 2 (TAT 6-24 HRS): SARS Coronavirus 2: NEGATIVE

## 2021-10-16 NOTE — Progress Notes (Signed)
Pt's CBG was 63 at PAT. Pt given juice and crackers. CBG recheck was 122

## 2021-10-16 NOTE — Progress Notes (Signed)
PCP - [name], FNP Cardiologist - Dr. Candee Furbish- clearance obtained  PPM/ICD - denies   Chest x-ray - pt states she had one "many years ago" EKG - 09/18/21 Stress Test - 02/14/20 ECHO - 02/25/21 Cardiac Cath - denies  Sleep Study - denies  DM- Pre-diabetic Fasting Blood Sugar - 85-95 Checks Blood Sugar once  a day  Blood Thinner Instructions: n/a Aspirin Instructions: n/a  ERAS Protcol - no, NPO   COVID TEST- 10/16/21 at PAT appt   Anesthesia review: yes, cardiac hx  Patient denies shortness of breath, fever, cough and chest pain at PAT appointment   All instructions explained to the patient, with a verbal understanding of the material. Patient agrees to go over the instructions while at home for a better understanding. Patient also instructed to wear a mask in public after being tested for COVID-19. The opportunity to ask questions was provided.

## 2021-10-17 NOTE — Anesthesia Preprocedure Evaluation (Addendum)
Anesthesia Evaluation  Patient identified by MRN, date of birth, ID band Patient awake    Reviewed: Allergy & Precautions, NPO status , Patient's Chart, lab work & pertinent test results  History of Anesthesia Complications (+) history of anesthetic complications  Airway Mallampati: II  TM Distance: >3 FB Neck ROM: Full    Dental  (+) Dental Advisory Given, Teeth Intact   Pulmonary former smoker,    Pulmonary exam normal breath sounds clear to auscultation       Cardiovascular hypertension, Pt. on medications Normal cardiovascular exam Rhythm:Regular Rate:Normal  Echo 02/2021 1. Left ventricular ejection fraction, by estimation, is 60 to 65%. The left ventricle has normal function. The left ventricle has no regional wall motion abnormalities. Left ventricular diastolic parameters were normal.  2. Right ventricular systolic function is normal. The right ventricular size is normal. Tricuspid regurgitation signal is inadequate for assessing PA pressure.  3. The mitral valve is grossly normal. Trivial mitral valve  regurgitation. No evidence of mitral stenosis.  4. The aortic valve is tricuspid. There is mild calcification of the aortic valve. Aortic valve regurgitation is not visualized. Mild aortic valve sclerosis is present, with no evidence of aortic valve stenosis.  5. The inferior vena cava is normal in size with greater than 50% respiratory variability, suggesting right atrial pressure of 3 mmHg.   Neuro/Psych negative neurological ROS     GI/Hepatic negative GI ROS, Neg liver ROS,   Endo/Other  Hypothyroidism   Renal/GU negative Renal ROS     Musculoskeletal negative musculoskeletal ROS (+)   Abdominal (+) + obese,   Peds  Hematology  (+) Blood dyscrasia, anemia ,   Anesthesia Other Findings   Reproductive/Obstetrics                           Anesthesia Physical Anesthesia  Plan  ASA: 2  Anesthesia Plan: General   Post-op Pain Management:    Induction: Intravenous  PONV Risk Score and Plan: 4 or greater and Ondansetron, Dexamethasone, Treatment may vary due to age or medical condition, Aprepitant, Propofol infusion and Midazolam  Airway Management Planned: Oral ETT  Additional Equipment: None  Intra-op Plan:   Post-operative Plan: Extubation in OR  Informed Consent: I have reviewed the patients History and Physical, chart, labs and discussed the procedure including the risks, benefits and alternatives for the proposed anesthesia with the patient or authorized representative who has indicated his/her understanding and acceptance.     Dental advisory given  Plan Discussed with: CRNA  Anesthesia Plan Comments: (PAT note written 10/17/2021 by Myra Gianotti, PA-C. )      Anesthesia Quick Evaluation

## 2021-10-17 NOTE — Progress Notes (Signed)
Anesthesia Chart Review:  Case: [id number] Date/Time: 10/20/21 0715   Procedures:      PANNICULECTOMY (Abdomen) - 3.5 hours     HERNIA REPAIR VENTRAL ADULT WITH MESH   Anesthesia type: General   Pre-op diagnosis: Panniculitis   Location: [facility] ROOM 07 / [facility]   Surgeons: Wallace Going, DO; Coralie Keens, MD       DISCUSSION: Patient is a 70 year old female scheduled for the above procedure.  History includes former smoker, HTN, HLD, prediabetes, hypothyroidism, anemia (1995), bifascicular block, cholecystectomy, tonsillectomy, obesity.  Reported waking up during tonsillectomy when she was 70 years old.  She moved to [place] from [place] ~ 2020.  Low risk stress test in 2021. Normal LVEF, AV sclerosis without AI or AS on 02/2021 echo. Preoperative cardiology evaluation by Leanor Kail, PA on 09/24/21. He wrote, "Surgical clearance Patient is easily getting greater than 4 METS of activity.  Given past medical history and time since last visit, based on ACC/AHA guidelines, Talaysia Pinheiro would be at acceptable risk for the planned procedure without further cardiovascular testing."  She also has preoperative medical clearance by [name], FNP.  Preoperative COVID-19 test negative on 10/16/2021.  Anesthesia team to evaluate on the day of surgery.  VS: BP (!) 115/57   Pulse (!) 55   Temp 37.1 C (Oral)   Resp 18   Ht 5' 5" (1.651 m)   Wt 103 kg   SpO2 100%   BMI 37.77 kg/m   PROVIDERS: [name], FNP is PCP Vanice Sarah, MD is cardiologist. He initially evaluated her on 02/08/20 for abnormal EKG (bifascicular block) when be considered for panniculectomy. She had a low risk stress test then. Echo showed mild-moderate AV sclerosis without AI or AS. Echo stable in 02/25/21.   LABS: Labs reviewed: Acceptable for surgery. A1c 5.8%, LFTs and PT/PTT normal on 09/18/21.  (all labs ordered are listed, but only abnormal results are displayed)  Labs Reviewed  GLUCOSE, CAPILLARY -  Abnormal; Notable for the following components:      Result Value   Glucose-Capillary 64 (*)    All other components within normal limits  BASIC METABOLIC PANEL - Abnormal; Notable for the following components:   Glucose, Bld 117 (*)    BUN 27 (*)    Creatinine, Ser 1.08 (*)    GFR, Estimated 55 (*)    All other components within normal limits  CBC - Abnormal; Notable for the following components:   HCT 46.2 (*)    All other components within normal limits  GLUCOSE, CAPILLARY - Abnormal; Notable for the following components:   Glucose-Capillary 122 (*)    All other components within normal limits  SARS CORONAVIRUS 2 (TAT 6-24 HRS)     EKG: 09/18/21: SR. RBBB. LAFB. Bifascicular block.   CV: Echo 02/25/21: IMPRESSIONS   1. Left ventricular ejection fraction, by estimation, is 60 to 65%. The  left ventricle has normal function. The left ventricle has no regional  wall motion abnormalities. Left ventricular diastolic parameters were  normal.   2. Right ventricular systolic function is normal. The right ventricular  size is normal. Tricuspid regurgitation signal is inadequate for assessing  PA pressure.   3. The mitral valve is grossly normal. Trivial mitral valve  regurgitation. No evidence of mitral stenosis.   4. The aortic valve is tricuspid. There is mild calcification of the  aortic valve. Aortic valve regurgitation is not visualized. Mild aortic  valve sclerosis is present, with no evidence  of aortic valve stenosis.   5. The inferior vena cava is normal in size with greater than 50%  respiratory variability, suggesting right atrial pressure of 3 mmHg.  - Comparison(s): No significant change from prior study. Aortic sclerosis  remains without evidence of stenosis.   Nuclear stress test 02/14/20: Nuclear stress EF: 64%. There was no ST segment deviation noted during stress. This is a low risk study. The left ventricular ejection fraction is normal (55-65%). There is a  small fixed defect of moderate severity present in the apical and inferoapical regions, and mild fixed defect in the anterior, apical septal, apical lateral location. Improved on stress as compared to rest. Wall motion normal at the apex. This finding likely represents artifact, however cannot exclude prior infarct. Study is overall low risk.    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   Complication of anesthesia    pt states she woke up during a tonsillectomy when she was 28   Hyperlipidemia    Hypertension    Hypothyroidism 1980   Prediabetes    Thyroid disease     Past Surgical History:  Procedure Laterality Date   BREAST SURGERY     CHOLECYSTECTOMY     NASAL SEPTUM SURGERY  1990   REDUCTION MAMMAPLASTY     TONSILLECTOMY      MEDICATIONS:  Accu-Chek Softclix Lancets lancets   atorvastatin (LIPITOR) 20 MG tablet   azelastine (OPTIVAR) 0.05 % ophthalmic solution   blood glucose meter kit and supplies   Blood Glucose Monitoring Suppl (ACCU-CHEK GUIDE) w/Device KIT   Blood Pressure Monitor KIT   Cholecalciferol 125 MCG (5000 UT) TABS   clobetasol (TEMOVATE) 0.05 % external solution   clobetasol ointment (TEMOVATE) 0.05 %   desoximetasone (TOPICORT) 0.25 % cream   GLUCOSAMINE CHONDROITIN COMPLX PO   Lactobacillus (PROBIOTIC ACIDOPHILUS PO)   levothyroxine (SYNTHROID) 150 MCG tablet   Magnesium 250 MG TABS   Multiple Vitamins-Minerals (MULTIVITAMIN WOMEN 50+) TABS   ondansetron (ZOFRAN ODT) 4 MG disintegrating tablet   ONETOUCH ULTRA test strip   spironolactone (ALDACTONE) 50 MG tablet   No current facility-administered medications for this encounter.   Myra Gianotti, PA-C Surgical Short Stay/Anesthesiology [facility] Phone [phone] [facility] Phone [phone] 10/17/2021 10:05 AM

## 2021-10-19 NOTE — H&P (Signed)
REFERRING PHYSICIAN: Audelia Hives, DO  PROVIDER: Beverlee Nims, MD  MRN: [id number] DOB: 17-Jul-1951   Subjective   Chief Complaint: Umbilical Hernia   History of Present Illness: Nancy [name] is a 70 y.o. female who is seen today as an office consultation at the request of Dr. Migdalia Dk for evaluation of Umbilical Hernia .   She is here today for evaluation of a large ventral hernia. She was referred by plastic surgery as she is being considered for a panniculectomy. She has had the hernia for some time. She came with a report of a CT scan done elsewhere showing a ventral hernia with a defect measuring 3.7 cm in size containing transverse colon. She has had minimal discomfort from the hernia. She has had no obstructive symptoms. She had a previous open cholecystectomy done in [place] many years ago  Review of Systems: A complete review of systems was obtained from the patient. I have reviewed this information and discussed as appropriate with the patient. See HPI as well for other ROS.  ROS   Medical History: Past Medical History:  Diagnosis Date   Anemia   HTN (hypertension)   Hyperlipemia   Thyroid disorder   Patient Active Problem List  Diagnosis   Dry eyes   Essential hypertension   Family history of early CAD   Left anterior fascicular block   Mixed hyperlipidemia   Panniculitis   Prediabetes   Right bundle branch block   Past Surgical History:  Procedure Laterality Date   CHOLECYSTECTOMY   CLOSED REDUCTION NASAL SEPTUM FRACTURE 1985   COMBINED REDUCTION MAMMAPLASTY W/ ABDOMINOPLASTY 1976   TONSILLECTOMY 1978    No Known Allergies  Current Outpatient Medications on File Prior to Visit  Medication Sig Dispense Refill   atorvastatin (LIPITOR) 20 MG tablet TAKE ONE TABLET BY MOUTH EVERYDAY AT BEDTIME FOR CHOLESTEROL   levothyroxine (SYNTHROID) 150 MCG tablet TAKE ONE TABLET BY MOUTH BEFORE BREAKFAST   spironolactone (ALDACTONE) 50 MG tablet Take  50 mg by mouth once daily   No current facility-administered medications on file prior to visit.   History reviewed. No pertinent family history.   Social History   Tobacco Use  Smoking Status Never Smoker  Smokeless Tobacco Never Used    Social History   Socioeconomic History   Marital status: Divorced  Tobacco Use   Smoking status: Never Smoker   Smokeless tobacco: Never Used  Substance and Sexual Activity   Alcohol use: Yes   Drug use: Never   Objective:   Vitals:   BP: (!) 140/82  Pulse: 85  Temp: 36.9 C (98.5 F)  SpO2: 98%  Weight: (!) 107.6 kg (237 lb 3.2 oz)  Height: 165.1 cm (5\' 5" )   Body mass index is 39.47 kg/m.  Physical Exam   She appears well on exam  Her abdomen is soft. There is a chronically incarcerated hernia which is nontender above the umbilicus. I cannot feel the fascial defect secondary to the contents of the hernia sac  Lungs clear  CV RRR  Skin without erythema  Neuro grossly intact   Labs, Imaging and Diagnostic Testing: I have reviewed the previous report of her CAT scan of the abdomen pelvis  Assessment and Plan:  Ventral incisional hernia  This is a moderate sized ventral hernia with actually a small fascial defect. I believe I could perform an open repair of this hernia with mesh during the panniculectomy by the plastic surgeons. I discussed abdominal wall anatomy and hernias with  the patient in detail. We discussed the reasonings to her repair hernias. I discussed the use of mesh. We discussed the procedure in detail. I discussed the risk which includes but is not limited to bleeding, infection, injury to surrounding structures, the need for further procedures, hernia recurrence, cardiopulmonary issues, etc. She will need cardiac clearance prior to surgery which will be scheduled.

## 2021-10-20 ENCOUNTER — Ambulatory Visit (HOSPITAL_COMMUNITY): Payer: Medicare HMO | Admitting: Anesthesiology

## 2021-10-20 ENCOUNTER — Encounter (HOSPITAL_COMMUNITY): Payer: Self-pay | Admitting: Plastic Surgery

## 2021-10-20 ENCOUNTER — Observation Stay (HOSPITAL_COMMUNITY)
Admission: RE | Admit: 2021-10-20 | Discharge: 2021-10-21 | Disposition: A | Discharge status: Home / Self Care | Payer: Medicare HMO | Attending: Plastic Surgery | Admitting: Plastic Surgery

## 2021-10-20 ENCOUNTER — Other Ambulatory Visit: Payer: Self-pay

## 2021-10-20 ENCOUNTER — Encounter (HOSPITAL_COMMUNITY)
Admission: RE | Disposition: A | Discharge status: Home / Self Care | Payer: Self-pay | Source: Home / Self Care | Attending: Plastic Surgery

## 2021-10-20 ENCOUNTER — Ambulatory Visit (HOSPITAL_COMMUNITY): Payer: Medicare HMO | Admitting: Vascular Surgery

## 2021-10-20 DIAGNOSIS — Z87891 Personal history of nicotine dependence: Secondary | ICD-10-CM | POA: Insufficient documentation

## 2021-10-20 DIAGNOSIS — Z79899 Other long term (current) drug therapy: Secondary | ICD-10-CM | POA: Diagnosis not present

## 2021-10-20 DIAGNOSIS — M793 Panniculitis, unspecified: Secondary | ICD-10-CM | POA: Diagnosis not present

## 2021-10-20 DIAGNOSIS — R7303 Prediabetes: Secondary | ICD-10-CM | POA: Insufficient documentation

## 2021-10-20 DIAGNOSIS — I10 Essential (primary) hypertension: Secondary | ICD-10-CM | POA: Insufficient documentation

## 2021-10-20 DIAGNOSIS — K439 Ventral hernia without obstruction or gangrene: Secondary | ICD-10-CM | POA: Diagnosis not present

## 2021-10-20 DIAGNOSIS — E782 Mixed hyperlipidemia: Secondary | ICD-10-CM | POA: Diagnosis not present

## 2021-10-20 HISTORY — PX: PANNICULECTOMY: SHX5360

## 2021-10-20 HISTORY — PX: VENTRAL HERNIA REPAIR: SHX424

## 2021-10-20 LAB — HIV ANTIBODY (ROUTINE TESTING W REFLEX): HIV Screen 4th Generation wRfx: NONREACTIVE

## 2021-10-20 LAB — GLUCOSE, CAPILLARY: Glucose-Capillary: 91 mg/dL (ref 70–99)

## 2021-10-20 SURGERY — PANNICULECTOMY
Anesthesia: General | Site: Abdomen

## 2021-10-20 MED ORDER — ROCURONIUM BROMIDE 10 MG/ML (PF) SYRINGE
PREFILLED_SYRINGE | INTRAVENOUS | Status: AC
  Filled 2021-10-20: qty 10

## 2021-10-20 MED ORDER — PHENYLEPHRINE HCL (PRESSORS) 10 MG/ML IV SOLN
INTRAVENOUS | Status: DC | PRN
  Administered 2021-10-20 (×7): 80 ug via INTRAVENOUS
  Administered 2021-10-20: 120 ug via INTRAVENOUS

## 2021-10-20 MED ORDER — HYDROMORPHONE HCL 1 MG/ML IJ SOLN: 1.0000 mg | INTRAMUSCULAR | Status: DC | PRN

## 2021-10-20 MED ORDER — EPHEDRINE 5 MG/ML INJ
INTRAVENOUS | Status: AC
  Filled 2021-10-20: qty 5

## 2021-10-20 MED ORDER — ROCURONIUM BROMIDE 10 MG/ML (PF) SYRINGE
PREFILLED_SYRINGE | INTRAVENOUS | Status: DC | PRN
  Administered 2021-10-20: 60 mg via INTRAVENOUS

## 2021-10-20 MED ORDER — APREPITANT 40 MG PO CAPS
40.0000 mg | ORAL_CAPSULE | Freq: Once | ORAL | Status: AC
  Administered 2021-10-20: 40 mg via ORAL
  Filled 2021-10-20: qty 1

## 2021-10-20 MED ORDER — ORAL CARE MOUTH RINSE: 15.0000 mL | Freq: Once | OROMUCOSAL | Status: AC

## 2021-10-20 MED ORDER — LACTATED RINGERS IV SOLN
INTRAVENOUS | Status: DC
Start: 2021-10-20 — End: 2021-10-20

## 2021-10-20 MED ORDER — PROPOFOL 10 MG/ML IV BOLUS
INTRAVENOUS | Status: DC | PRN
  Administered 2021-10-20: 160 mg via INTRAVENOUS

## 2021-10-20 MED ORDER — LIDOCAINE-EPINEPHRINE 1 %-1:100000 IJ SOLN
INTRAMUSCULAR | Status: DC | PRN
  Administered 2021-10-20: 17 mL

## 2021-10-20 MED ORDER — CHLORHEXIDINE GLUCONATE CLOTH 2 % EX PADS: 6.0000 | MEDICATED_PAD | Freq: Once | CUTANEOUS | Status: DC

## 2021-10-20 MED ORDER — DROPERIDOL 2.5 MG/ML IJ SOLN: 0.6250 mg | Freq: Once | INTRAMUSCULAR | Status: DC | PRN

## 2021-10-20 MED ORDER — ACETAMINOPHEN 325 MG PO TABS
325.0000 mg | ORAL_TABLET | Freq: Four times a day (QID) | ORAL | Status: DC
  Administered 2021-10-20 – 2021-10-21 (×5): 325 mg via ORAL
  Filled 2021-10-20 (×5): qty 1

## 2021-10-20 MED ORDER — HYDROCODONE-ACETAMINOPHEN 5-325 MG PO TABS
1.0000 | ORAL_TABLET | ORAL | Status: DC | PRN
Start: 2021-10-20 — End: 2021-10-21

## 2021-10-20 MED ORDER — BUPIVACAINE LIPOSOME 1.3 % IJ SUSP
INTRAMUSCULAR | Status: AC
  Filled 2021-10-20: qty 20

## 2021-10-20 MED ORDER — GLYCOPYRROLATE PF 0.2 MG/ML IJ SOSY
PREFILLED_SYRINGE | INTRAMUSCULAR | Status: AC
  Filled 2021-10-20: qty 1

## 2021-10-20 MED ORDER — LIDOCAINE-EPINEPHRINE 1 %-1:100000 IJ SOLN
INTRAMUSCULAR | Status: AC
  Filled 2021-10-20: qty 1

## 2021-10-20 MED ORDER — CEFAZOLIN SODIUM-DEXTROSE 2-4 GM/100ML-% IV SOLN
2.0000 g | INTRAVENOUS | Status: AC
  Administered 2021-10-20: 2 g via INTRAVENOUS
  Filled 2021-10-20: qty 100

## 2021-10-20 MED ORDER — POLYETHYLENE GLYCOL 3350 17 G PO PACK: 17.0000 g | PACK | Freq: Every day | ORAL | Status: DC | PRN

## 2021-10-20 MED ORDER — ACETAMINOPHEN 500 MG PO TABS
1000.0000 mg | ORAL_TABLET | Freq: Once | ORAL | Status: AC
  Administered 2021-10-20: 1000 mg via ORAL
  Filled 2021-10-20: qty 2

## 2021-10-20 MED ORDER — SODIUM BICARBONATE 4.2 % IV SOLN
Freq: Once | INTRAVENOUS | Status: DC
  Filled 2021-10-20: qty 50

## 2021-10-20 MED ORDER — PROPOFOL 10 MG/ML IV BOLUS
INTRAVENOUS | Status: AC
  Filled 2021-10-20: qty 20

## 2021-10-20 MED ORDER — PHENYLEPHRINE 40 MCG/ML (10ML) SYRINGE FOR IV PUSH (FOR BLOOD PRESSURE SUPPORT)
PREFILLED_SYRINGE | INTRAVENOUS | Status: AC
  Filled 2021-10-20: qty 10

## 2021-10-20 MED ORDER — LIDOCAINE 2% (20 MG/ML) 5 ML SYRINGE
INTRAMUSCULAR | Status: AC
  Filled 2021-10-20: qty 5

## 2021-10-20 MED ORDER — HYDROMORPHONE HCL 1 MG/ML IJ SOLN
INTRAMUSCULAR | Status: AC
  Administered 2021-10-20: 0.5 mg via INTRAVENOUS
  Filled 2021-10-20: qty 1

## 2021-10-20 MED ORDER — CEFAZOLIN SODIUM-DEXTROSE 2-4 GM/100ML-% IV SOLN
2.0000 g | Freq: Three times a day (TID) | INTRAVENOUS | Status: DC
Start: 2021-10-20 — End: 2021-10-21
  Administered 2021-10-20 – 2021-10-21 (×3): 2 g via INTRAVENOUS
  Filled 2021-10-20 (×5): qty 100

## 2021-10-20 MED ORDER — BISACODYL 10 MG RE SUPP: 10.0000 mg | Freq: Every day | RECTAL | Status: DC | PRN

## 2021-10-20 MED ORDER — ONDANSETRON HCL 4 MG/2ML IJ SOLN
INTRAMUSCULAR | Status: AC
  Filled 2021-10-20: qty 2

## 2021-10-20 MED ORDER — ONDANSETRON HCL 4 MG/2ML IJ SOLN: 4.0000 mg | Freq: Four times a day (QID) | INTRAMUSCULAR | Status: DC | PRN

## 2021-10-20 MED ORDER — CEFAZOLIN SODIUM-DEXTROSE 2-4 GM/100ML-% IV SOLN: 2.0000 g | INTRAVENOUS | Status: DC

## 2021-10-20 MED ORDER — FENTANYL CITRATE (PF) 100 MCG/2ML IJ SOLN
INTRAMUSCULAR | Status: DC | PRN
  Administered 2021-10-20: 100 ug via INTRAVENOUS

## 2021-10-20 MED ORDER — MIDAZOLAM HCL 2 MG/2ML IJ SOLN
INTRAMUSCULAR | Status: AC
  Filled 2021-10-20: qty 2

## 2021-10-20 MED ORDER — IBUPROFEN 400 MG PO TABS
400.0000 mg | ORAL_TABLET | Freq: Four times a day (QID) | ORAL | Status: DC
  Administered 2021-10-20 – 2021-10-21 (×5): 400 mg via ORAL
  Filled 2021-10-20 (×5): qty 1

## 2021-10-20 MED ORDER — SENNA 8.6 MG PO TABS
1.0000 | ORAL_TABLET | Freq: Two times a day (BID) | ORAL | Status: DC
  Administered 2021-10-20 – 2021-10-21 (×2): 8.6 mg via ORAL
  Filled 2021-10-20 (×2): qty 1

## 2021-10-20 MED ORDER — ONDANSETRON 4 MG PO TBDP: 4.0000 mg | ORAL_TABLET | Freq: Four times a day (QID) | ORAL | Status: DC | PRN

## 2021-10-20 MED ORDER — KCL IN DEXTROSE-NACL 20-5-0.45 MEQ/L-%-% IV SOLN
INTRAVENOUS | Status: DC
  Filled 2021-10-20 (×2): qty 1000

## 2021-10-20 MED ORDER — BUPIVACAINE HCL (PF) 0.25 % IJ SOLN
INTRAMUSCULAR | Status: DC | PRN
  Administered 2021-10-20: 27 mL

## 2021-10-20 MED ORDER — DIPHENHYDRAMINE HCL 50 MG/ML IJ SOLN: 12.5000 mg | Freq: Four times a day (QID) | INTRAMUSCULAR | Status: DC | PRN

## 2021-10-20 MED ORDER — SUGAMMADEX SODIUM 200 MG/2ML IV SOLN
INTRAVENOUS | Status: DC | PRN
  Administered 2021-10-20: 200 mg via INTRAVENOUS

## 2021-10-20 MED ORDER — DEXAMETHASONE SODIUM PHOSPHATE 10 MG/ML IJ SOLN
INTRAMUSCULAR | Status: AC
  Filled 2021-10-20: qty 1

## 2021-10-20 MED ORDER — LACTATED RINGERS IV SOLN: INTRAVENOUS | Status: DC | PRN

## 2021-10-20 MED ORDER — EPHEDRINE SULFATE 50 MG/ML IJ SOLN
INTRAMUSCULAR | Status: DC | PRN
  Administered 2021-10-20 (×2): 10 mg via INTRAVENOUS
  Administered 2021-10-20: 5 mg via INTRAVENOUS
  Administered 2021-10-20 (×2): 10 mg via INTRAVENOUS

## 2021-10-20 MED ORDER — GLYCOPYRROLATE 0.2 MG/ML IJ SOLN
INTRAMUSCULAR | Status: DC | PRN
  Administered 2021-10-20 (×2): .1 mg via INTRAVENOUS

## 2021-10-20 MED ORDER — MIDAZOLAM HCL 5 MG/5ML IJ SOLN
INTRAMUSCULAR | Status: DC | PRN
  Administered 2021-10-20: 2 mg via INTRAVENOUS

## 2021-10-20 MED ORDER — 0.9 % SODIUM CHLORIDE (POUR BTL) OPTIME
TOPICAL | Status: DC | PRN
  Administered 2021-10-20: 450 mL

## 2021-10-20 MED ORDER — ZOLPIDEM TARTRATE 5 MG PO TABS: 5.0000 mg | ORAL_TABLET | Freq: Every evening | ORAL | Status: DC | PRN

## 2021-10-20 MED ORDER — HYDROMORPHONE HCL 1 MG/ML IJ SOLN: 0.2500 mg | INTRAMUSCULAR | Status: DC | PRN

## 2021-10-20 MED ORDER — LIDOCAINE 2% (20 MG/ML) 5 ML SYRINGE
INTRAMUSCULAR | Status: DC | PRN
  Administered 2021-10-20: 100 mg via INTRAVENOUS

## 2021-10-20 MED ORDER — METHOCARBAMOL 500 MG PO TABS: 500.0000 mg | ORAL_TABLET | Freq: Four times a day (QID) | ORAL | Status: DC | PRN

## 2021-10-20 MED ORDER — DEXAMETHASONE SODIUM PHOSPHATE 10 MG/ML IJ SOLN
INTRAMUSCULAR | Status: DC | PRN
  Administered 2021-10-20: 10 mg via INTRAVENOUS

## 2021-10-20 MED ORDER — BUPIVACAINE HCL (PF) 0.25 % IJ SOLN
INTRAMUSCULAR | Status: AC
  Filled 2021-10-20: qty 30

## 2021-10-20 MED ORDER — ONDANSETRON HCL 4 MG/2ML IJ SOLN
INTRAMUSCULAR | Status: DC | PRN
  Administered 2021-10-20: 4 mg via INTRAVENOUS

## 2021-10-20 MED ORDER — CHLORHEXIDINE GLUCONATE 0.12 % MT SOLN
15.0000 mL | Freq: Once | OROMUCOSAL | Status: AC
  Administered 2021-10-20: 15 mL via OROMUCOSAL
  Filled 2021-10-20: qty 15

## 2021-10-20 MED ORDER — FENTANYL CITRATE (PF) 250 MCG/5ML IJ SOLN
INTRAMUSCULAR | Status: AC
  Filled 2021-10-20: qty 5

## 2021-10-20 MED ORDER — DIPHENHYDRAMINE HCL 12.5 MG/5ML PO ELIX: 12.5000 mg | ORAL_SOLUTION | Freq: Four times a day (QID) | ORAL | Status: DC | PRN

## 2021-10-20 SURGICAL SUPPLY — 79 items
APPLIER CLIP 9.375 MED OPEN (MISCELLANEOUS)
BAG COUNTER SPONGE SURGICOUNT (BAG) ×4 IMPLANT
BAG FLUID COLLECT WCU SOMAVAC (MISCELLANEOUS) ×2 IMPLANT
BINDER ABDOMINAL 10 UNV 27-48 (MISCELLANEOUS) IMPLANT
BINDER ABDOMINAL 12 SM 30-45 (SOFTGOODS) ×1 IMPLANT
BINDER ABDOMINAL 12 XL 75-84 (SOFTGOODS) ×1 IMPLANT
BIOPATCH RED 1 DISK 7.0 (GAUZE/BANDAGES/DRESSINGS) ×2 IMPLANT
BLADE CLIPPER SURG (BLADE) IMPLANT
CANISTER SUCT 3000ML PPV (MISCELLANEOUS) ×1 IMPLANT
CHLORAPREP W/TINT 26 (MISCELLANEOUS) ×3 IMPLANT
CLIP APPLIE 9.375 MED OPEN (MISCELLANEOUS) IMPLANT
COVER SURGICAL LIGHT HANDLE (MISCELLANEOUS) ×2 IMPLANT
DERMABOND ADVANCED (GAUZE/BANDAGES/DRESSINGS) ×1
DERMABOND ADVANCED .7 DNX12 (GAUZE/BANDAGES/DRESSINGS) ×2 IMPLANT
DRAIN CHANNEL 19F RND (DRAIN) ×2 IMPLANT
DRAPE HALF SHEET 40X57 (DRAPES) ×2 IMPLANT
DRAPE INCISE IOBAN 66X45 STRL (DRAPES) ×1 IMPLANT
DRAPE LAPAROSCOPIC ABDOMINAL (DRAPES) ×1 IMPLANT
DRSG AQUACEL AG ADV 3.5X14 (GAUZE/BANDAGES/DRESSINGS) ×1 IMPLANT
DRSG MEPILEX BORDER 4X8 (GAUZE/BANDAGES/DRESSINGS) ×1 IMPLANT
DRSG PAD ABDOMINAL 8X10 ST (GAUZE/BANDAGES/DRESSINGS) ×2 IMPLANT
DRSG TEGADERM 4X4.75 (GAUZE/BANDAGES/DRESSINGS) ×1 IMPLANT
DRSG TELFA 3X8 NADH (GAUZE/BANDAGES/DRESSINGS) IMPLANT
ELECT BLADE 4.0 EZ CLEAN MEGAD (MISCELLANEOUS) ×2
ELECT REM PT RETURN 9FT ADLT (ELECTROSURGICAL) ×2
ELECTRODE BLDE 4.0 EZ CLN MEGD (MISCELLANEOUS) ×1 IMPLANT
ELECTRODE REM PT RTRN 9FT ADLT (ELECTROSURGICAL) ×2 IMPLANT
EVACUATOR SILICONE 100CC (DRAIN) ×2 IMPLANT
GAUZE SPONGE 4X4 12PLY STRL (GAUZE/BANDAGES/DRESSINGS) ×1 IMPLANT
GAUZE XEROFORM 1X8 LF (GAUZE/BANDAGES/DRESSINGS) ×1 IMPLANT
GLOVE SURG ENC MOIS LTX SZ6.5 (GLOVE) ×6 IMPLANT
GLOVE SURG ENC MOIS LTX SZ7.5 (GLOVE) ×2 IMPLANT
GLOVE SURG SIGNA 7.5 PF LTX (GLOVE) ×2 IMPLANT
GOWN STRL REUS W/ TWL LRG LVL3 (GOWN DISPOSABLE) ×4 IMPLANT
GOWN STRL REUS W/ TWL XL LVL3 (GOWN DISPOSABLE) ×1 IMPLANT
GOWN STRL REUS W/TWL LRG LVL3 (GOWN DISPOSABLE) ×3
GOWN STRL REUS W/TWL XL LVL3 (GOWN DISPOSABLE)
KIT BASIN OR (CUSTOM PROCEDURE TRAY) ×3 IMPLANT
KIT TURNOVER KIT B (KITS) ×1 IMPLANT
MESH VENTRALEX ST 2.5 CRC MED (Mesh General) ×1 IMPLANT
NDL HYPO 25GX1X1/2 BEV (NEEDLE) ×1 IMPLANT
NDL HYPO 25X1 1.5 SAFETY (NEEDLE) ×1 IMPLANT
NEEDLE HYPO 25GX1X1/2 BEV (NEEDLE) ×2 IMPLANT
NEEDLE HYPO 25X1 1.5 SAFETY (NEEDLE) ×2 IMPLANT
NS IRRIG 1000ML POUR BTL (IV SOLUTION) ×4 IMPLANT
PACK GENERAL/GYN (CUSTOM PROCEDURE TRAY) ×3 IMPLANT
PACK UNIVERSAL I (CUSTOM PROCEDURE TRAY) ×2 IMPLANT
PAD ABD 8X10 STRL (GAUZE/BANDAGES/DRESSINGS) ×1 IMPLANT
PAD ARMBOARD 7.5X6 YLW CONV (MISCELLANEOUS) ×2 IMPLANT
PAD DRESSING TELFA 3X8 NADH (GAUZE/BANDAGES/DRESSINGS) IMPLANT
PENCIL SMOKE EVACUATOR (MISCELLANEOUS) ×3 IMPLANT
PIN SAFETY STERILE (MISCELLANEOUS) IMPLANT
SLEEVE SCD COMPRESS KNEE MED (STOCKING) ×2 IMPLANT
SPONGE T-LAP 18X18 ~~LOC~~+RFID (SPONGE) ×4 IMPLANT
STAPLER VISISTAT 35W (STAPLE) ×1 IMPLANT
STRIP CLOSURE SKIN 1/2X4 (GAUZE/BANDAGES/DRESSINGS) ×1 IMPLANT
SUT MNCRL AB 4-0 PS2 18 (SUTURE) ×13 IMPLANT
SUT MON AB 3-0 SH 27 (SUTURE) ×5
SUT MON AB 3-0 SH27 (SUTURE) ×2 IMPLANT
SUT MON AB 5-0 PS2 18 (SUTURE) ×6 IMPLANT
SUT NOVA 1 T20/GS 25DT (SUTURE) ×2 IMPLANT
SUT NOVA NAB DX-16 0-1 5-0 T12 (SUTURE) IMPLANT
SUT PDS AB 2-0 CT1 27 (SUTURE) ×4 IMPLANT
SUT PDS AB 3-0 SH 27 (SUTURE) ×6 IMPLANT
SUT PROLENE 1 CT (SUTURE) IMPLANT
SUT SILK 3 0 SH 30 (SUTURE) ×2 IMPLANT
SUT VIC AB 3-0 SH 27 (SUTURE) ×1
SUT VIC AB 3-0 SH 27X BRD (SUTURE) ×1 IMPLANT
SUT VIC AB 3-0 SH 27XBRD (SUTURE) ×1 IMPLANT
SUT VICRYL 4-0 PS2 18IN ABS (SUTURE) ×6 IMPLANT
SYR CONTROL 10ML LL (SYRINGE) ×3 IMPLANT
SYSTEM VACUUM SOMAVAC SVS (PUMP) ×1 IMPLANT
TOWEL GREEN STERILE (TOWEL DISPOSABLE) ×1 IMPLANT
TOWEL GREEN STERILE FF (TOWEL DISPOSABLE) ×5 IMPLANT
TRAY FOLEY MTR SLVR 14FR STAT (SET/KITS/TRAYS/PACK) IMPLANT
TRAY FOLEY W/BAG SLVR 14FR LF (SET/KITS/TRAYS/PACK) IMPLANT
UNDERPAD 30X36 HEAVY ABSORB (UNDERPADS AND DIAPERS) ×2 IMPLANT
Waste collection units Lawson #145400 ×2
somavac sustained vacuum system Lawson #145397 ×1

## 2021-10-20 NOTE — Op Note (Signed)
Operative Report  Date of operation: 10/20/2021  Patient: Nancy [name], MRN: [id number], 70 y.o. female.   Date of birth: November 03, 1951  Location: [facility] Main Operating Room Observation  Preoperative Diagnosis:  Panniculitis  Postoperative Diagnosis:  Same  Procedure: Panniculectomy  Surgeon:  Theodoro Kos Jaydalee Bardwell  Assistant:  Dr. Nedra Hai  Anesthesia:  General  EBL:  150cc  Drains:  2 number 57 blake round drains  Condition:  Stable  Complications: None  Disposition: Recovery Room  Procedure in Detail: Patient was seen the morning of her surgery.  The patient was marked and the surgery plan was reviewed.  An IV was placed and antibiotics were administered.  The patient was taken to the operating room and underwent general anesthesia.  A time out was called and all information was confirmed to be correct. SCD's were placed on the legs.  A pillow was placed under the knees. The patient was prepped and draped in the standard sterile fashion. Local was placed into the incision and 2 stab incisions made laterally.  The marked lower incision was incised with dissection taken to scarpa's fascia and the rectus abdominus fascia. The skin and subcutaneous tissue was then lifted off the fascia to the level of the umbilicus. The umbilicus was then circumscribed and freed from the surrounding skin.  Care was given to not devascularize the stalk.  The abdominal wall was freed above the supraumbilical hernia.  General surgery then performed the hernia repair and this will be dictated separately.  Once the hernia was complete the patient was rendered to the plastic surgery service.  The patient was flexed on the table which aided in determining the amount of abdomen skin that would be safely excised.  The pannus was excised and weighed (~3500 gm). The mons was also suspended with 3-0 Monocryl.  Two #19 blake round drains were placed and secured to the skin with 3-0 Silk.  The new position  of the umbilicus was determined and a small v shaped incision placed in the abdominal wall. The umbilicus was secured with 4.0 Vicryl at 12, 3, 6, and 9 o'clock. It was inset from the dermis to the abdominal wall and then closed with 5-0 Monocryl. The abdominal wall was closed with buried 3-0 Monocryl and subcuticular 4-0 Monocryl.    Dermabond was applied. ABD's and an abdominal binder were placed. Patient was allowed to wake up, extubated and taken on a stretcher in the flexed position to the recovery room. Family was notified at the end of the case.   The advanced practice practitioner (APP) assisted throughout the case.  The APP was essential in retraction and counter traction when needed to make the case progress smoothly.  This retraction and assistance made it possible to see the tissue plans for the procedure.  The assistance was needed for blood control, tissue re-approximation and assisted with closure of the incision site.

## 2021-10-20 NOTE — Transfer of Care (Signed)
Immediate Anesthesia Transfer of Care Note  Patient: Nancy [name]  Procedure(s) Performed: PANNICULECTOMY (Abdomen) HERNIA REPAIR VENTRAL ADULT WITH MESH (Abdomen)  Patient Location: PACU  Anesthesia Type:General  Level of Consciousness: awake, alert  and oriented  Airway & Oxygen Therapy: Patient Spontanous Breathing  Post-op Assessment: Report given to RN and Post -op Vital signs reviewed and stable  Post vital signs: Reviewed and stable  Last Vitals:  Vitals Value Taken Time  BP 100/44 10/20/21 1047  Temp    Pulse 79 10/20/21 1047  Resp 22 10/20/21 1047  SpO2 98 % 10/20/21 1047  Vitals shown include unvalidated device data.  Last Pain:  Vitals:   10/20/21 0608  TempSrc:   PainSc: 0-No pain         Complications: No notable events documented.

## 2021-10-20 NOTE — Anesthesia Procedure Notes (Signed)
Procedure Name: Intubation Date/Time: 10/20/2021 7:49 AM Performed by: Kerry Fort, CRNA Pre-anesthesia Checklist: Patient identified, Emergency Drugs available, Suction available and Patient being monitored Patient Re-evaluated:Patient Re-evaluated prior to induction Oxygen Delivery Method: Circle system utilized Preoxygenation: Pre-oxygenation with 100% oxygen Induction Type: IV induction Ventilation: Mask ventilation without difficulty Laryngoscope Size: Mac and 3 Tube type: Oral Tube size: 7.0 mm Number of attempts: 1 Airway Equipment and Method: Stylet Placement Confirmation: ETT inserted through vocal cords under direct vision, positive ETCO2 and breath sounds checked- equal and bilateral Secured at: 21 cm Tube secured with: Tape Dental Injury: Teeth and Oropharynx as per pre-operative assessment

## 2021-10-20 NOTE — Progress Notes (Signed)
Mobility Specialist Progress Note:   10/20/21 1510  Mobility  Activity Ambulated in room  Level of Assistance Contact guard assist, steadying assist  Assistive Device Other (Comment) (HHA)  Distance Ambulated (ft) 34 ft  Mobility Ambulated with assistance in room  Mobility Response Tolerated well  Mobility performed by Mobility specialist  Bed Position Chair  $Mobility charge 1 Mobility   Pt displayed minor dizziness upon standing for first time. Ambulated with HHA for comfort. Left in chair.   Nelta Numbers Mobility Specialist  Phone [phone]

## 2021-10-20 NOTE — Interval H&P Note (Signed)
History and Physical Interval Note: no change in H and P  10/20/2021 6:59 AM  Nancy [name]  has presented today for surgery, with the diagnosis of Panniculitis.  The various methods of treatment have been discussed with the patient and family. After consideration of risks, benefits and other options for treatment, the patient has consented to  Procedure(s) with comments: PANNICULECTOMY (N/A) - 3.5 hours [place] (N/A) as a surgical intervention.  The patient's history has been reviewed, patient examined, no change in status, stable for surgery.  I have reviewed the patient's chart and labs.  Questions were answered to the patient's satisfaction.     Coralie Keens

## 2021-10-20 NOTE — Interval H&P Note (Signed)
History and Physical Interval Note:  10/20/2021 7:16 AM  Nancy [name]  has presented today for surgery, with the diagnosis of Panniculitis.  The various methods of treatment have been discussed with the patient and family. After consideration of risks, benefits and other options for treatment, the patient has consented to  Procedure(s) with comments: PANNICULECTOMY (N/A) - 3.5 hours [place] (N/A) as a surgical intervention.  The patient's history has been reviewed, patient examined, no change in status, stable for surgery.  I have reviewed the patient's chart and labs.  Questions were answered to the patient's satisfaction.     Loel Lofty Tionne Dayhoff

## 2021-10-20 NOTE — Op Note (Signed)
   Nancy [name] 10/20/2021   Pre-op Diagnosis: Ventral Hernia      Post-op Diagnosis: same  Procedure(s): VENTRAL HERNIA REPAIR WITH MESH  Surgeon(s): Dillingham, Loel Lofty, DO Coralie Keens, MD Carlena Hurl, PA-C  Anesthesia: General  Staff:  Circulator: Carron Curie, RN Scrub Person: Deland Pretty, RN  Estimated Blood Loss: Minimal               Indications: This is a 70 year old female who is undergoing a panniculectomy by Dr. Marla Roe.  She also has a symptomatic hernia just above the umbilicus containing transverse colon.  The decision was made to proceed with a hernia repair with mesh during the panniculectomy.  Procedure: The patient was brought to operating identifies correct patient.  She is placed upon the operating table general anesthesia was induced.  Her abdomen was prepped and draped in usual sterile fashion.  Dr. Marla Roe began the panniculectomy and will dictate her portion of the procedure.  Once she dissected out the umbilicus and separated from the overlying pannus and then mobilized the pannus above the ventral hernia I then proceeded with the ventral hernia repair.  The patient had transverse colon and omentum coming up through the fascial defect which was approximate 3 cm in size.  I was able to enter the sac and then excised the sac with the cautery.  I was able to free the fascia up circumferentially from adhesions completely around the hernia defect.  Hemostasis was then achieved the areas where the sac was removed with the cautery.  I was then able to reduce the contents back into the abdominal cavity.  A 6.4 cm round ventral patch from [facility] was then brought to the field.  I placed it through the fascial opening and pulled up against the peritoneum with the ties.  The mesh was then sutured in place with interrupted #1 Novafil suture circumferentially.  I then cut the ties and closed the fascia over the top of the mesh with figure-of-eight #1 Novafil  sutures.  Wide coverage of the fascial defect and then good closure of the fascia appeared to be achieved.  Dr. Marla Roe then continued her portion of the panniculectomy.            Coralie Keens   Date: 10/20/2021  Time: 8:56 AM

## 2021-10-20 NOTE — Discharge Instructions (Addendum)
INSTRUCTIONS FOR AFTER SURGERY   You will likely have some questions about what to expect following your operation.  The following information will help you and your family understand what to expect when you are discharged from the hospital.  Following these guidelines will help ensure a smooth recovery and reduce risks of complications.  Postoperative instructions include information on: diet, wound care, medications and physical activity.  AFTER SURGERY Expect to go home after the procedure.  In some cases, you may need to spend one night in the hospital for observation.  DIET This surgery does not require a specific diet.  However, I have to mention that the healthier you eat the better your body can start healing. It is important to increasing your protein intake.  This means limiting the foods with added sugar.  Focus on fruits and vegetables and some meat. It is very important to drink water after your surgery.  If your urine is bright yellow, then it is concentrated, and you need to drink more water.  As a general rule after surgery, you should have 8 ounces of water every hour while awake.  If you find you are persistently nauseated or unable to take in liquids let us know.  NO TOBACCO USE or EXPOSURE.  This will slow your healing process and increase the risk of a wound.  WOUND CARE Clean with baby wipes for 3-5 days and then you can shower.  If you have a binder you may remove it to shower and then put it back on. If you have steri-strips / tape directly attached to your skin leave them in place. It is OK to get these wet.  No baths, pools or hot tubs for two weeks. We close your incision to leave the smallest and best-looking scar. No ointment or creams on your incisions until given the go ahead.  Especially not Neosporin (Too many skin reactions with this one).  A few weeks after surgery you can use Mederma and start massaging the scar. We ask you to wear your binder for the first 6 weeks  around the clock, including while sleeping. This provides added comfort and helps reduce the fluid accumulation at the surgery site.  ACTIVITY No heavy lifting until cleared by the doctor.  It is OK to walk and climb stairs. In fact, moving your legs is very important to decrease your risk of a blood clot.  It will also help keep you from getting deconditioned.  Every 1 to 2 hours get up and walk for 5 minutes. This will help with a quicker recovery back to normal.  Let pain be your guide so you don't do too much.  NO, you cannot do the spring cleaning and don't plan on taking care of anyone else.  This is your time for TLC.   WORK Everyone returns to work at different times. As a rough guide, most people take at least 1 - 2 weeks off prior to returning to work. If you need documentation for your job, bring the forms to your postoperative follow up visit.  DRIVING Arrange for someone to bring you home from the hospital.  You may be able to drive a few days after surgery but not while taking any narcotics or valium.  BOWEL MOVEMENTS Constipation can occur after anesthesia and while taking pain medication.  It is important to stay ahead for your comfort.  We recommend taking Milk of Magnesia (2 tablespoons; twice a day) while taking the pain pills.  SEROMA This is fluid your body tried to put in the surgical site.  This is normal but if it creates excessive pain and swelling let us know.  It usually decreases in a few weeks.  MEDICATIONS and PAIN CONTROL At your preoperative visit for you history and physical you were given the following medications: An antibiotic: Start this medication when you get home and take according to the instructions on the bottle. Zofran 4 mg:  This is to treat nausea and vomiting.  You can take this every 6 hours as needed and only if needed. Norco (hydrocodone/acetaminophen) 5/325 mg:  This is only to be used after you have taken the motrin or the tylenol. Every 8  hours as needed. Over the counter Medication to take: Ibuprofen (Motrin) 600 mg:  Take this every 6 hours.  If you have additional pain then take 500 mg of the tylenol.  Only take the Norco after you have tried these two. Miralax or stool softener of choice: Take this according to the bottle if you take the [place] Call your surgeon's office if any of the following occur:  Fever 101 degrees F or greater  Excessive bleeding or fluid from the incision site.  Pain that increases over time without aid from the medications  Redness, warmth, or pus draining from incision sites  Persistent nausea or inability to take in liquids  Severe misshapen area that underwent the operation.  [facility]astic Surgery Specialist  What is the benefit of having a drain?  During surgery your tissue layers are separated.  This raw surface stimulates your body to fill the space with serous fluid.  This is normal but you don't want that fluid to collect and prevent healing.  A fluid collection can also become infected.  The Jackson-Pratt (JP) drain is used to eliminate this collection of fluid and allow the tissue to heal together.    Jackson-Pratt (JP) bulb    How to care for your drainage and suction unit at home Your drainage catheter will be connected to a collection device. The vacuum caused when the device is compressed allows drainage to collect in the device.    Wash your hands with soap and water before and after touching the system. Empty the JP drain every 12 hours once you get home from your procedure. Record the fluid amount on the record sheet included. Start with stripping the drain tube to push the clots or excess fluid to the bulb.  Do this by pinching the tube with one hand near your skin.  Then with the other hand squeeze the tubing and work it toward the bulb.  This should be done several times a day.  This may collapse the tube which will correct on its own.   Use a safety pin to  attach your collection device to your clothing so there is no tension on the insertion site.   If you have drainage at the skin insertion site, you can apply a gauze dressing and secure it with tape. If the drain falls out, apply a gauze dressing over the drain insertion site and secure with tape.   To empty the collection device:   Release the stopper on the top of the collection unit (bulb).  Pour contents into a measuring container such as a plastic medicine cup.  Record the day and amount of drainage on the attached sheet. This should be done at least twice a day.    To compress  the Jackson-Pratt Bulb:  Release the stopper at the top of the bulb. Squeeze the bulb tightly in your fist, squeezing air out of the bulb.  Replace the stopper while the bulb is compressed.  Be careful not to spill the contents when squeezing the bulb. The drainage will start bright red and turn to pink and then yellow with time. IMPORTANT: If the bulb is not squeezed before adding the stopper it will not draw out the fluid.  Care for the JP drain site and your skin daily:  You may shower three days after surgery. Secure the drain to a ribbon or cloth around your waist while showering so it does not pull out while showering. Be sure your hands are cleaned with soap and water. Use a clean wet cotton swab to clean the skin around the drain site.  Use another cotton swab to place Vaseline or antibiotic ointment on the skin around the drain.     Contact your physician if any of the following occur:  The fluid in the bulb becomes cloudy. Your temperature is greater than 101.4.  The incision opens. If you have drainage at the skin insertion site, you can apply a gauze dressing and secure it with tape. If the drain falls out, apply a gauze dressing over the drain insertion site and secure with tape.  You will usually have more drainage when you are active than while you rest or are asleep. If the drainage increases  significantly or is bloody call the physician                             Bring this record with you to each office visit Date  Drainage Volume  Date   Drainage volume                                                                                                                                                                                              [facility] _______[facility]  UMBILICAL OR INGUINAL HERNIA REPAIR: POST OP INSTRUCTIONS  Always review your discharge instruction sheet given to you by the facility where your surgery was performed. IF YOU HAVE DISABILITY OR FAMILY LEAVE FORMS, YOU MUST BRING THEM TO THE OFFICE FOR PROCESSING.   DO NOT GIVE THEM TO YOUR DOCTOR.  1. A  prescription for pain medication may be given to you upon discharge.  Take your pain medication as prescribed, if needed.  If narcotic pain medicine is not needed, then you may take acetaminophen (Tylenol) or ibuprofen (Advil) as needed. 2. Take your  usually prescribed medications unless otherwise directed. If you need a refill on your pain medication, please contact your pharmacy.  They will contact our office to request authorization. Prescriptions will not be filled after 5 pm or on week-ends. 3. You should follow a light diet the first 24 hours after arrival home, such as soup and crackers, etc.  Be sure to include lots of fluids daily.  Resume your normal diet the day after surgery. 4.Most patients will experience some swelling and bruising around the umbilicus or in the groin and scrotum.  Ice packs and reclining will help.  Swelling and bruising can take several days to resolve.  6. It is common to experience some constipation if taking pain medication after surgery.  Increasing fluid intake and taking a stool softener (such as Colace) will usually help or prevent this problem from occurring.  A mild laxative (Milk of Magnesia or Miralax) should be taken according to  package directions if there are no bowel movements after 48 hours. 7. Unless discharge instructions indicate otherwise, you may remove your bandages 24-48 hours after surgery, and you may shower at that time.  You may have steri-strips (small skin tapes) in place directly over the incision.  These strips should be left on the skin for 7-10 days.  If your surgeon used skin glue on the incision, you may shower in 24 hours.  The glue will flake off over the next 2-3 weeks.  Any sutures or staples will be removed at the office during your follow-up visit. 8. ACTIVITIES:  You may resume regular (light) daily activities beginning the next day--such as daily self-care, walking, climbing stairs--gradually increasing activities as tolerated.  You may have sexual intercourse when it is comfortable.  Refrain from any heavy lifting or straining until approved by your doctor.  a.You may drive when you are no longer taking prescription pain medication, you can comfortably wear a seatbelt, and you can safely maneuver your car and apply brakes. b.RETURN TO WORK:   _____________________________________________  9.You should see your doctor in the office for a follow-up appointment approximately 2-3 weeks after your surgery.  Make sure that you call for this appointment within a day or two after you arrive home to insure a convenient appointment time. 10.OTHER INSTRUCTIONS: __NO LIFTING MORE THAN 15 POUNDS FOR 6 WEEKS_______________________    _____________________________________  WHEN TO CALL YOUR DOCTOR: Fever over 101.0 Inability to urinate Nausea and/or vomiting Extreme swelling or bruising Continued bleeding from incision. Increased pain, redness, or drainage from the incision  The clinic staff is available to answer your questions during regular business hours.  Please don't hesitate to call and ask to speak to one of the nurses for clinical concerns.  If you have a medical emergency, go to the nearest  emergency room or call 911.  A surgeon from [facility] is always on call at the hospital   [address] [place], [place], [place]  [address] ?  [address], [place], [place]   [address] [phone] ? [phone] ? FAX [phone] Web site: [email or web address]

## 2021-10-21 ENCOUNTER — Encounter (HOSPITAL_COMMUNITY): Payer: Self-pay | Admitting: Plastic Surgery

## 2021-10-21 DIAGNOSIS — M793 Panniculitis, unspecified: Secondary | ICD-10-CM | POA: Diagnosis not present

## 2021-10-21 DIAGNOSIS — K439 Ventral hernia without obstruction or gangrene: Secondary | ICD-10-CM | POA: Diagnosis not present

## 2021-10-21 DIAGNOSIS — Z87891 Personal history of nicotine dependence: Secondary | ICD-10-CM | POA: Diagnosis not present

## 2021-10-21 DIAGNOSIS — R7303 Prediabetes: Secondary | ICD-10-CM | POA: Diagnosis not present

## 2021-10-21 DIAGNOSIS — I10 Essential (primary) hypertension: Secondary | ICD-10-CM | POA: Diagnosis not present

## 2021-10-21 DIAGNOSIS — Z79899 Other long term (current) drug therapy: Secondary | ICD-10-CM | POA: Diagnosis not present

## 2021-10-21 NOTE — Discharge Summary (Signed)
Physician Discharge Summary  Patient ID: Nancy [name] MRN: [id number] DOB/AGE: Dec 05, 1951 70 y.o.  Admit date: 10/20/2021 Discharge date: 10/21/2021  Admission Diagnoses:  Discharge Diagnoses:  Active Problems:   Panniculitis   Discharged Condition: Patient reports feeling excellent.  She is comfortable sitting up in a chair this morning on my examination.  She had just ambulated to and from the bathroom.  She reports that she is eating and drinking without difficulty.  She has not yet had a bowel movement, but has Linzess at home if she does not pass stool in the next few days.  She denies any fevers, chills, redness, swelling, or worsening pain symptoms.  Patient reports no difficulty or complication from Somavac drain.  She reports that she has not required any narcotic analgesics this morning and plans to temporize her pain symptoms at home with Tylenol and ibuprofen alone.  She reports that her friend will be able to pick her up from the hospital today and assist with her care at home.    Hospital Course: Admitted for observation after panniculectomy and hernia repair.  Consults: None  Significant Diagnostic Studies: None  Treatments: surgery: Panniculectomy, hernia repair.  Discharge Exam: Blood pressure (!) 105/91, pulse 66, temperature 97.8 F (36.6 C), temperature source Oral, resp. rate 18, height 5' 5"  (1.651 m), weight 102.1 kg, SpO2 97 %. General appearance: alert, cooperative, and well-appearing.  Pleased. GI: Abdominal binder in place. Somavac with serosanguineous drainage. Extremities: SCDs remain in place.  Ambulatory.  Disposition: Discharge disposition: 01-Home or Self Care       Discharge Instructions     Call MD for:   Complete by: As directed    Call MD for:  difficulty breathing, headache or visual disturbances   Complete by: As directed    Call MD for:  extreme fatigue   Complete by: As directed    Call MD for:  hives   Complete by: As directed     Call MD for:  persistant dizziness or light-headedness   Complete by: As directed    Call MD for:  persistant nausea and vomiting   Complete by: As directed    Call MD for:  redness, tenderness, or signs of infection (pain, swelling, redness, odor or Jazzlene Huot/yellow discharge around incision site)   Complete by: As directed    Call MD for:  severe uncontrolled pain   Complete by: As directed    Call MD for:  temperature >100.4   Complete by: As directed    Diet - low sodium heart healthy   Complete by: As directed    Increase activity slowly   Complete by: As directed       Allergies as of 10/21/2021   No Known Allergies      Medication List     TAKE these medications    Accu-Chek Guide w/Device Kit 1 Device by Does not apply route daily.   Accu-Chek Softclix Lancets lancets Use to check blood sugars daily E11.69   atorvastatin 20 MG tablet Commonly known as: LIPITOR TAKE 1 TABLET BY MOUTH EVERY NIGHT FOR CHOLESTEROL   azelastine 0.05 % ophthalmic solution Commonly known as: OPTIVAR INSTILL ONE DROP IN [facility] EYE TWICE DAILY What changed: See the new instructions.   blood glucose meter kit and supplies Dispense based on patient and insurance preference. Use up to four times daily as directed. (FOR ICD-10 E10.9, E11.9).   Blood Pressure Monitor Kit USE TO CHECK BLOOD PRESSURE 110   Cholecalciferol 125 MCG (  5000 UT) Tabs Take 5,000 Units by mouth daily.   clobetasol ointment 0.05 % Commonly known as: TEMOVATE Apply to elbows knees and back behind ears nightly   clobetasol 0.05 % external solution Commonly known as: TEMOVATE Apply to scalp nightly   desoximetasone 0.25 % cream Commonly known as: TOPICORT Apply to skin daily safe to use on face and skin folds   GLUCOSAMINE CHONDROITIN COMPLX PO Take 1 capsule by mouth daily. 1500 mg/1200 mg   levothyroxine 150 MCG tablet Commonly known as: SYNTHROID Take 1 tablet (150 mcg total) by mouth daily before  breakfast.   Magnesium 250 MG Tabs Take 250 mg by mouth daily.   Multivitamin Women 50+ Tabs Take 1 tablet by mouth daily.   ondansetron 4 MG disintegrating tablet Commonly known as: Zofran ODT Take 1 tablet (4 mg total) by mouth every 8 (eight) hours as needed for nausea or vomiting.   OneTouch Ultra test strip Generic drug: glucose blood USE AS INSTRUCTED   PROBIOTIC ACIDOPHILUS PO Take 0.5 mg by mouth daily.   spironolactone 50 MG tablet Commonly known as: ALDACTONE Take 1 tablet (50 mg total) by mouth daily.        Follow-up Information     Dillingham, Loel Lofty, DO Follow up in 10 day(s).   Specialty: Plastic Surgery Contact information: [address] [place] [address] [phone]         Coralie Keens, MD. Schedule an appointment as soon as possible for a visit in 3 week(s).   Specialty: General Surgery Contact information: [place] [address] [facility] [address] [place], [place] [address] [phone]  Signed: Krista Blue 10/21/2021, 7:41 AM

## 2021-10-21 NOTE — Progress Notes (Signed)
Patient ID: Nancy [name], female   DOB: 04-23-1951, 70 y.o.   MRN: [id number]   Comfortable this morning Up in a chair Drains serosang Abdomen with binder in place

## 2021-10-21 NOTE — Progress Notes (Signed)
Patient given home equipment. Equipment usage explained to patient, patient verbalizes understanding. Iv removed patient discharged

## 2021-10-21 NOTE — Progress Notes (Signed)
Mobility Specialist Progress Note:   10/21/21 1000  Mobility  Activity Ambulated to bathroom  Level of Assistance Standby assist, set-up cues, supervision of patient - no hands on  Assistive Device None  Distance Ambulated (ft) 15 ft  Mobility Out of bed for toileting  Mobility Response Tolerated well  Mobility performed by Mobility specialist  $Mobility charge 1 Mobility   Pt asx during ambulation. Claimed to be feeling really good this am. Lorelee Cover for d/c.   Nelta Numbers Mobility Specialist  Phone [phone]

## 2021-10-22 ENCOUNTER — Telehealth: Payer: Self-pay

## 2021-10-22 NOTE — Anesthesia Postprocedure Evaluation (Signed)
Anesthesia Post Note  Patient: Nancy [name]  Procedure(s) Performed: PANNICULECTOMY (Abdomen) HERNIA REPAIR VENTRAL ADULT WITH MESH (Abdomen)     Patient location during evaluation: PACU Anesthesia Type: General Level of consciousness: sedated and patient cooperative Pain management: pain level controlled Vital Signs Assessment: post-procedure vital signs reviewed and stable Respiratory status: spontaneous breathing Cardiovascular status: stable Anesthetic complications: no   No notable events documented.  Last Vitals:  Vitals:   10/21/21 0414 10/21/21 0750  BP: (!) 105/91 (!) 93/48  Pulse: 66 65  Resp: 18 14  Temp: 36.6 C 36.6 C  SpO2: 97% 98%    Last Pain:  Vitals:   10/21/21 0750  TempSrc: Oral  PainSc: 0-No pain                 Nolon Nations

## 2021-10-22 NOTE — Telephone Encounter (Signed)
Transition Care Management Follow-up Telephone Call Date of discharge and from where: 10/21/2021 How have you been since you were released from the hospital? Pt complains of discomfort. But she is doing okay.  Any questions or concerns? No  Items Reviewed: Did the pt receive and understand the discharge instructions provided? Yes  Medications obtained and verified? Yes  Other? No  Any new allergies since your discharge? No  Dietary orders reviewed? Yes Do you have support at home? No   Home Care and Equipment/Supplies: Were home health services ordered? no If so, what is the name of the agency? N/a  Has the agency set up a time to come to the patient's home? not applicable Were any new equipment or medical supplies ordered?  No What is the name of the medical supply agency? N/a Were you able to get the supplies/equipment? not applicable Do you have any questions related to the use of the equipment or supplies? No  Functional Questionnaire: (I = Independent and D = Dependent) ADLs: i  Bathing/Dressing- i  Meal Prep- i  Eating- i  Maintaining continence- i  Transferring/Ambulation- i  Managing Meds- i  Follow up appointments reviewed:  PCP Hospital f/u appt confirmed? Yes  Scheduled to see raman ghumman on 10/29/2021 @ [facility]. [facility] f/u appt confirmed? No  Scheduled to see n/a on n/a @ n/a. Are transportation arrangements needed? No  If their condition worsens, is the pt aware to call PCP or go to the Emergency Dept.? Yes  Was the patient provided with contact information for the PCP's office or ED? Yes Was to pt encouraged to call back with questions or concerns? Yes

## 2021-10-27 ENCOUNTER — Encounter (HOSPITAL_COMMUNITY): Payer: Self-pay | Admitting: Plastic Surgery

## 2021-10-29 ENCOUNTER — Inpatient Hospital Stay: Payer: Medicare HMO | Admitting: Nurse Practitioner

## 2021-10-29 NOTE — Telephone Encounter (Signed)
Error

## 2021-10-30 ENCOUNTER — Encounter (HOSPITAL_COMMUNITY): Payer: Self-pay | Admitting: Plastic Surgery

## 2021-10-31 ENCOUNTER — Ambulatory Visit (INDEPENDENT_AMBULATORY_CARE_PROVIDER_SITE_OTHER): Payer: Medicare HMO | Admitting: Plastic Surgery

## 2021-10-31 ENCOUNTER — Other Ambulatory Visit: Payer: Self-pay

## 2021-10-31 ENCOUNTER — Encounter: Payer: Self-pay | Admitting: Plastic Surgery

## 2021-10-31 DIAGNOSIS — M793 Panniculitis, unspecified: Secondary | ICD-10-CM

## 2021-10-31 NOTE — Progress Notes (Signed)
The patient is a 70 year old female here for follow-up after undergoing a panniculectomy with hernia repair.  Her drains are in place.  Her incisions were a little bit moist under the dressing.  I went ahead and remove those and put ABDs.  I also stripped the drains to make sure that they were working.  She has the automated device.  I Georgina Peer have her keep that for 1 more week.  Hopefully we can remove the right drain at her next visit.  She should continue with the binder or with spanks.  She can shower if she can secure the drains.  We will see her next week.  She has her follow-up appointment with general surgery for 2 weeks.

## 2021-11-03 ENCOUNTER — Encounter (HOSPITAL_COMMUNITY): Payer: Self-pay | Admitting: Plastic Surgery

## 2021-11-07 ENCOUNTER — Ambulatory Visit (INDEPENDENT_AMBULATORY_CARE_PROVIDER_SITE_OTHER): Payer: Medicare HMO | Admitting: Physician Assistant

## 2021-11-07 ENCOUNTER — Other Ambulatory Visit: Payer: Self-pay

## 2021-11-07 DIAGNOSIS — Z9889 Other specified postprocedural states: Secondary | ICD-10-CM

## 2021-11-07 NOTE — Progress Notes (Signed)
Patient is a 70 year old female with PMH of HTN, HLD, and most notably panniculitis s/p panniculectomy with concurrent hernia repair performed 10/20/2021 with Dr. Marla Roe.  She was last seen here in clinic for initial postoperative encounter 10/31/2021 by Dr. Marla Roe.  At that time, she was doing well.  Her dressings were removed, but drains were left to remain in place.  Plan was for removal of right drain at subsequent appointment.  Continue with spanks/binder.  Today, patient is doing quite well.  Drain 1 (corresponding with right-sided drain) has been slowing considerably, approximately 20 to 30 cc/day consistently.  Drain 2 (corresponding with left-sided drain) has continued to see greater than 50 cc output per day.  She denies any significant pain, fevers, redness, drainage, or areas of dehiscence.  She is overall doing quite well.  She wears the binder in addition to dressings and underwear.  She has not been able to wear the spanks due to the drains.  Physical exam is reassuring.  No areas of dehiscence on exam.  No obvious wounds noted.  Her incision appears to be healing well.  It is mildly dry appearing and slightly irritated.  However, no cellulitic changes noted.  Right-sided drain was removed without complication.  She has approximately 9 bags remaining with her drain device.  She will call the supplier to inquire about additional bags in the event that she is still draining greater than 50 cc/day at next follow-up appointment.  She will call the clinic if she has any issues.  Recommended that she apply Vaseline over the incision areas as they were dry appearing and slightly irritated.  She otherwise appears well, nontoxic appearing.  Pain is relatively well controlled.  Continue with compression and activity modifications.

## 2021-11-18 ENCOUNTER — Encounter: Payer: Self-pay | Admitting: Nurse Practitioner

## 2021-11-18 ENCOUNTER — Ambulatory Visit (INDEPENDENT_AMBULATORY_CARE_PROVIDER_SITE_OTHER): Payer: Medicare HMO | Admitting: Physician Assistant

## 2021-11-18 ENCOUNTER — Other Ambulatory Visit: Payer: Self-pay

## 2021-11-18 DIAGNOSIS — Z9889 Other specified postprocedural states: Secondary | ICD-10-CM

## 2021-11-18 NOTE — Progress Notes (Signed)
Patient is a 70 year old female with PMH of HTN, HLD, and most notably panniculitis s/p panniculectomy with concurrent hernia repair performed 10/20/2021 with Dr. Marla Roe.  I received a phone call from the [facility] representative that the pump on her drain expires tomorrow and that it would no longer provide suction for continued drainage.  I then called the patient who reports that she still has been experiencing drainage of over 100 cc/day.  She provides me with her documentation of recorded drain volumes, they appear to fluctuate between 100 to 150 cc/day.  Removed Somavac system and placed JP bulb on the existing drain.  Reinforced tube at insertion site with tape as it appears to be pulling on the suture.  JP bulb is functional, holding good charge.  Yellowish serosanguineous drainage noted in bulb, normal-appearing.  Her panniculectomy incision appears to be healing, albeit slowly.  There are multiple small, less than 1 cm wounds scattered throughout.  They are superficial, nondraining.  The area.  Moist and I recommended that she continue to apply gauze and Vaseline as needed.  Patient then complained of bilateral lower extremity swelling and edema which she reports is unusual for her.  She reports that she started having atraumatic left lumbar region discomfort a few days ago that feels like "spasms".  She thinks it may be positional.  She reports that is worse when she is lying down which has resulted in her sitting more than usual.  Since then, she has also experienced increased lower extremity swelling and edema.  She also feels deconditioned.  Heart rate is approximately 75 bpm on my exam.  She is not demonstrating any increased work of breathing and is speaking in full sentences.  She has symmetric bilateral lower extremity edema, 2+ pitting.  She is neurovascularly intact.  Suspect that it may be dependent edema from her being in a more seated position recently.  However, given her collection  of symptoms, recommending that she follow-up with her primary care provider this week for evaluation and work-up.  While it is likely postsurgical lumbar region muscle spasms and dependent edema from her positioning, cannot exclude fluid overload or renal involvement.  Recommending that she see PCP for appropriate work-up, possibly including UA, metabolic panel, and BNP.  She also understands that if she has any new or worsening symptoms that she has to go to the ER for work-up.  In the interim, recommending that she elevate her legs.  We will have her return in another couple of weeks for reevaluation and possibly drain removal depending on her continued output.  She will call to move up her appointment should she have any changes or complications.

## 2021-11-19 ENCOUNTER — Encounter: Payer: Self-pay | Admitting: Nurse Practitioner

## 2021-11-19 ENCOUNTER — Ambulatory Visit (INDEPENDENT_AMBULATORY_CARE_PROVIDER_SITE_OTHER): Payer: Medicare HMO | Admitting: Nurse Practitioner

## 2021-11-19 VITALS — BP 112/74 | HR 71 | Temp 98.4°F | Ht 65.0 in | Wt 221.4 lb

## 2021-11-19 DIAGNOSIS — M546 Pain in thoracic spine: Secondary | ICD-10-CM | POA: Diagnosis not present

## 2021-11-19 DIAGNOSIS — R600 Localized edema: Secondary | ICD-10-CM

## 2021-11-19 LAB — POCT URINALYSIS DIPSTICK
Bilirubin, UA: NEGATIVE
Blood, UA: NEGATIVE
Glucose, UA: NEGATIVE
Ketones, UA: NEGATIVE
Leukocytes, UA: NEGATIVE
Nitrite, UA: NEGATIVE
Protein, UA: NEGATIVE
Spec Grav, UA: <=1.005 — AB (ref 1.010–1.025)
Urobilinogen, UA: 0.2 E.U./dL
pH, UA: 5 (ref 5.0–8.0)

## 2021-11-19 MED ORDER — TIZANIDINE HCL 2 MG PO TABS: 2.0000 mg | ORAL_TABLET | Freq: Three times a day (TID) | ORAL | 0 refills | Status: DC | PRN

## 2021-11-19 NOTE — Progress Notes (Signed)
I,Katawbba Wiggins,acting as a Education administrator for [employer], FNP.,have documented all relevant documentation on the behalf of [name], FNP,as directed by  [name], FNP while in the presence of [name], [place].   This visit occurred during the SARS-CoV-2 public health emergency.  Safety protocols were in place, including screening questions prior to the visit, additional usage of staff PPE, and extensive cleaning of exam room while observing appropriate contact time as indicated for disinfecting solutions.  Subjective:     Patient ID: Nancy [name] , female    DOB: Apr 22, 1951 , 70 y.o.   MRN: [id number]   Chief Complaint  Patient presents with   Back Pain    HPI  The patient is today for evaluation of back pain.  She has recently had paniculectomy surgery one month ago. Now having low back pain. She is trying to move around.  She is having the pain on left side. Pain is worse with movement.    Back Pain This is a new problem. The current episode started in the past 7 days. The problem occurs intermittently. The problem has been gradually worsening since onset. The pain is present in the lumbar spine. The quality of the pain is described as aching and cramping. The pain does not radiate. The pain is at a severity of 8/10 (when really bad is an 8/10, feels improved today). Associated symptoms include headaches. Pertinent negatives include no weakness. Risk factors include obesity and sedentary lifestyle (recent surgery). She has tried analgesics (hydrocodone) for the symptoms.    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   Complication of anesthesia    pt states she woke up during a tonsillectomy when she was 28   Hyperlipidemia    Hypertension    Hypothyroidism 1980   Prediabetes    Thyroid disease      Family History  Problem Relation Age of Onset   Diabetes Mother    Hypertension Mother     Hypertension Father    Heart disease Father    Diabetes Sister    Kidney failure Sister    Diabetes Brother    Kidney disease Brother    Diabetes Sister    Diabetes Sister    Asthma Sister    Cancer Maternal Grandfather    Cancer Paternal Grandfather      Current Outpatient Medications:    Accu-Chek Softclix Lancets lancets, Use to check blood sugars daily E11.69, Disp: 100 each, Rfl: 2   atorvastatin (LIPITOR) 20 MG tablet, TAKE 1 TABLET BY MOUTH EVERY NIGHT FOR CHOLESTEROL, Disp: 90 tablet, Rfl: 1   azelastine (OPTIVAR) 0.05 % ophthalmic solution, INSTILL ONE DROP IN EACH EYE TWICE DAILY (Patient taking differently: Place 1 drop into both eyes 2 (two) times daily.), Disp: 6 mL, Rfl: 3   blood glucose meter kit and supplies, Dispense based on patient and insurance preference. Use up to four times daily as directed. (FOR ICD-10 E10.9, E11.9)., Disp: 1 each, Rfl: 3   Blood Glucose Monitoring Suppl (ACCU-CHEK GUIDE) w/Device KIT, 1 Device by Does not apply route daily., Disp: 1 kit, Rfl: 0   Blood Pressure Monitor KIT, USE TO CHECK BLOOD PRESSURE 110, Disp: 1 kit, Rfl: 2   Cholecalciferol 125 MCG (5000 UT) TABS, Take 5,000 Units by mouth daily., Disp: , Rfl:    clobetasol (TEMOVATE) 0.05 % external solution, Apply to scalp nightly, Disp: 50 mL, Rfl:  6   clobetasol ointment (TEMOVATE) 0.05 %, Apply to elbows knees and back behind ears nightly, Disp: 60 g, Rfl: 4   desoximetasone (TOPICORT) 0.25 % cream, Apply to skin daily safe to use on face and skin folds, Disp: 100 g, Rfl: 4   GLUCOSAMINE CHONDROITIN COMPLX PO, Take 1 capsule by mouth daily. 1500 mg/1200 mg, Disp: , Rfl:    Lactobacillus (PROBIOTIC ACIDOPHILUS PO), Take 0.5 mg by mouth daily., Disp: , Rfl:    levothyroxine (SYNTHROID) 150 MCG tablet, Take 1 tablet (150 mcg total) by mouth daily before breakfast., Disp: 115 tablet, Rfl: 1   Magnesium 250 MG TABS, Take 250 mg by mouth daily., Disp: , Rfl:    Multiple Vitamins-Minerals  (MULTIVITAMIN WOMEN 50+) TABS, Take 1 tablet by mouth daily., Disp: , Rfl:    ondansetron (ZOFRAN ODT) 4 MG disintegrating tablet, Take 1 tablet (4 mg total) by mouth every 8 (eight) hours as needed for nausea or vomiting., Disp: 20 tablet, Rfl: 0   ONETOUCH ULTRA test strip, USE AS INSTRUCTED, Disp: 100 strip, Rfl: 12   spironolactone (ALDACTONE) 50 MG tablet, Take 1 tablet (50 mg total) by mouth daily., Disp: 90 tablet, Rfl: 1   tiZANidine (ZANAFLEX) 2 MG tablet, Take 1 tablet (2 mg total) by mouth every 8 (eight) hours as needed for muscle spasms., Disp: 30 tablet, Rfl: 0   amoxicillin (AMOXIL) 875 MG tablet, Take 1 tablet (875 mg total) by mouth 2 (two) times daily., Disp: 20 tablet, Rfl: 0   No Known Allergies   Review of Systems  Constitutional: Negative.   Respiratory: Negative.    Cardiovascular:  Positive for leg swelling (began couple days after sugery and would go up and down.).  Gastrointestinal: Negative.   Genitourinary:  Positive for flank pain. Negative for difficulty urinating, frequency and urgency.  Musculoskeletal:  Positive for back pain.  Neurological:  Positive for headaches. Negative for weakness.  Psychiatric/Behavioral: Negative.    All other systems reviewed and are negative.   Today's Vitals   11/19/21 1209  BP: 112/74  Pulse: 71  Temp: 98.4 F (36.9 C)  Weight: 221 lb 6.4 oz (100.4 kg)  Height: 5' 5"  (1.651 m)  PainSc: 5   PainLoc: Back   Body mass index is 36.84 kg/m.  Wt Readings from Last 3 Encounters:  11/19/21 221 lb 6.4 oz (100.4 kg)  10/20/21 225 lb (102.1 kg)  10/16/21 227 lb (103 kg)    BP Readings from Last 3 Encounters:  11/19/21 112/74  10/21/21 (!) 93/48  10/16/21 (!) 115/57    Objective:  Physical Exam Vitals reviewed.  Constitutional:      General: She is not in acute distress.    Appearance: Normal appearance.  Pulmonary:     Effort: Pulmonary effort is normal. No respiratory distress.     Breath sounds: No wheezing.   Musculoskeletal:        General: No tenderness.     Right lower leg: Edema (1+) present.     Left lower leg: Edema (1+) present.  Skin:    Capillary Refill: Capillary refill takes less than 2 seconds.  Neurological:     General: No focal deficit present.     Mental Status: She is alert and oriented to person, place, and time.     Cranial Nerves: No cranial nerve deficit.     Motor: No weakness.  Psychiatric:        Mood and Affect: Mood normal.  Behavior: Behavior normal.        Thought Content: Thought content normal.        Judgment: Judgment normal.        Assessment And Plan:     1. Localized edema Comments: bilateral 1+ edema, advised to get more active as tolerated and to wear support socks during the day.  - CBC with Diff - BMP8+EGFR - Brain natriuretic peptide  2. Acute left-sided thoracic back pain Comments: Will check CXR to make sure not collapsed lung and provided muscle relaxer. May be related to sitting for longer periods. - POCT Urinalysis Dipstick (81002) - DG Chest 2 View; Future - tiZANidine (ZANAFLEX) 2 MG tablet; Take 1 tablet (2 mg total) by mouth every 8 (eight) hours as needed for muscle spasms.  Dispense: 30 tablet; Refill: 0    Patient was given opportunity to ask questions. Patient verbalized understanding of the plan and was able to repeat key elements of the plan. All questions were answered to their satisfaction.  [name], FNP   I, [name], FNP, have reviewed all documentation for this visit. The documentation on 11/19/21 for the exam, diagnosis, procedures, and orders are all accurate and complete.   IF YOU HAVE BEEN REFERRED TO A SPECIALIST, IT MAY TAKE 1-2 WEEKS TO SCHEDULE/PROCESS THE REFERRAL. IF YOU HAVE NOT HEARD FROM US/SPECIALIST IN TWO WEEKS, PLEASE GIVE [place] A CALL AT [phone].   THE PATIENT IS ENCOURAGED TO PRACTICE SOCIAL DISTANCING DUE TO THE COVID-19 PANDEMIC.

## 2021-11-20 ENCOUNTER — Ambulatory Visit
Admission: RE | Admit: 2021-11-20 | Discharge: 2021-11-20 | Disposition: A | Discharge status: Home / Self Care | Payer: Medicare HMO | Source: Ambulatory Visit | Attending: Nurse Practitioner | Admitting: Nurse Practitioner

## 2021-11-20 DIAGNOSIS — M546 Pain in thoracic spine: Secondary | ICD-10-CM

## 2021-11-20 DIAGNOSIS — J986 Disorders of diaphragm: Secondary | ICD-10-CM | POA: Diagnosis not present

## 2021-11-20 DIAGNOSIS — M7989 Other specified soft tissue disorders: Secondary | ICD-10-CM | POA: Diagnosis not present

## 2021-11-20 DIAGNOSIS — J984 Other disorders of lung: Secondary | ICD-10-CM | POA: Diagnosis not present

## 2021-11-20 LAB — CBC WITH DIFFERENTIAL/PLATELET
Basophils Absolute: 0.1 10*3/uL (ref 0.0–0.2)
Basos: 1 %
EOS (ABSOLUTE): 0.2 10*3/uL (ref 0.0–0.4)
Eos: 2 %
Hematocrit: 38.2 % (ref 34.0–46.6)
Hemoglobin: 12.2 g/dL (ref 11.1–15.9)
Immature Grans (Abs): 0 10*3/uL (ref 0.0–0.1)
Immature Granulocytes: 0 %
Lymphocytes Absolute: 1.4 10*3/uL (ref 0.7–3.1)
Lymphs: 16 %
MCH: 29.9 pg (ref 26.6–33.0)
MCHC: 31.9 g/dL (ref 31.5–35.7)
MCV: 94 fL (ref 79–97)
Monocytes Absolute: 0.8 10*3/uL (ref 0.1–0.9)
Monocytes: 9 %
Neutrophils Absolute: 6.5 10*3/uL (ref 1.4–7.0)
Neutrophils: 72 %
Platelets: 275 10*3/uL (ref 150–450)
RBC: 4.08 x10E6/uL (ref 3.77–5.28)
RDW: 14.2 % (ref 11.7–15.4)
WBC: 9 10*3/uL (ref 3.4–10.8)

## 2021-11-20 LAB — BMP8+EGFR
BUN/Creatinine Ratio: 19 (ref 12–28)
BUN: 22 mg/dL (ref 8–27)
CO2: 25 mmol/L (ref 20–29)
Calcium: 9.7 mg/dL (ref 8.7–10.3)
Chloride: 99 mmol/L (ref 96–106)
Creatinine, Ser: 1.15 mg/dL — ABNORMAL HIGH (ref 0.57–1.00)
Glucose: 87 mg/dL (ref 70–99)
Potassium: 5.1 mmol/L (ref 3.5–5.2)
Sodium: 140 mmol/L (ref 134–144)
eGFR: 51 mL/min/{1.73_m2} — ABNORMAL LOW (ref >59)

## 2021-11-20 LAB — BRAIN NATRIURETIC PEPTIDE: BNP: 28.5 pg/mL (ref 0.0–100.0)

## 2021-11-21 ENCOUNTER — Encounter: Payer: Medicare HMO | Admitting: Physician Assistant

## 2021-11-23 ENCOUNTER — Other Ambulatory Visit: Payer: Self-pay | Admitting: Nurse Practitioner

## 2021-11-23 DIAGNOSIS — M546 Pain in thoracic spine: Secondary | ICD-10-CM

## 2021-11-23 MED ORDER — AMOXICILLIN 875 MG PO TABS: 875.0000 mg | ORAL_TABLET | Freq: Two times a day (BID) | ORAL | 0 refills | Status: DC

## 2021-11-30 ENCOUNTER — Encounter: Payer: Self-pay | Admitting: Nurse Practitioner

## 2021-12-02 ENCOUNTER — Telehealth: Payer: Self-pay

## 2021-12-02 ENCOUNTER — Ambulatory Visit (INDEPENDENT_AMBULATORY_CARE_PROVIDER_SITE_OTHER): Payer: Medicare HMO | Admitting: Physician Assistant

## 2021-12-02 ENCOUNTER — Other Ambulatory Visit: Payer: Self-pay

## 2021-12-02 DIAGNOSIS — Z9889 Other specified postprocedural states: Secondary | ICD-10-CM

## 2021-12-02 MED ORDER — STRATA TRIZ EX GEL: 0.5000 [in_us] | Freq: Every day | CUTANEOUS | 0 refills | Status: DC

## 2021-12-02 MED ORDER — STRATA TRIZ EX GEL: 0.2500 [in_us] | Freq: Every day | CUTANEOUS | 0 refills | Status: DC

## 2021-12-02 MED ORDER — STRATA TRIZ EX GEL
0.5000 [in_us] | Freq: Every day | CUTANEOUS | 0 refills | Status: DC
Start: 2021-12-02 — End: 2022-03-31

## 2021-12-02 NOTE — Chronic Care Management (AMB) (Signed)
°  Nancy [name] was reminded to have all medications, supplements and any blood glucose and blood pressure readings available for review with Orlando Penner, Pharm. D, at her telephone visit on 12-03-2021 at 2:00.   Questions: Have you had any recent office visit or specialist visit outside of [facility]? Patient stated no  Are there any concerns you would like to discuss during your office visit? Patient stated no  Are you having any problems obtaining your medications? (Whether it pharmacy issues or cost) Patient stated no  If patient has any PAP medications ask if they are having any problems getting their PAP medication or refill? No PAP medications  Care Gaps: PNA Vac overdue Yearly foot exam overdue  Star Rating Drug: Atorvastatin 20 mg- Last filled 09-26-2021 [facility]  Any gaps in medications fill history? No  [facility] Pharmacist Assistant [phone]

## 2021-12-02 NOTE — Progress Notes (Signed)
Patient is a 70 year old female with PMH of HTN, HLD, and most notably panniculitis s/p panniculectomy with concurrent hernia repair performed 10/20/2021 with Dr. Marla Roe.  Patient was last seen here in the office on 11/18/2021.  At that time, she was continuing to experience 100-150 cc/day drainage from [facility] system which was set to expire.  The system was removed and a JP bulb was placed on the existing drain.  There were multiple small, less than 1 cm wound scattered throughout panniculectomy incision, but appear to be healing well.  No evidence of infection.  She did complain of bilateral lower extremity swelling and edema which she reported was unusual for her.  She also complained of atraumatic left lumbar region discomfort.  Her vital signs were reassuring and she exhibited no increased work of breathing.  There was edema noted, but suspected to be likely dependent edema.  Regardless, recommended that she follow-up with her primary care provider.  Today, patient is doing quite well.  She states that her edema has resolved, she suspects that it was simply dependent edema.  She had a comprehensive work-up performed by her PCP that was reassuring.  She reports that she has continued to put Vaseline on her incision site followed by maxi pads and secured with undergarments.  She denies any continued drainage.  Pain is controlled.  Physical exam is very reassuring.  Her incision has healed almost entirely, small 1 x 1 cm wound at midline of incision with surrounding hypertrophy.  No surrounding cellulitic findings.  She has had multiple surgeries in the past without keloiding or hypertrophic scarring.  Given concern for midline scarring as well as persistent wound from her panniculectomy performed 6 weeks ago, applied sample of Strataderm here in clinic and will discharge her home with continued Strataderm product.  She is overall very pleased with her surgical outcome.  Offered follow-up in 4 weeks if  wound is persistent, but I anticipate that it will resolve.  She can cancel if everything is improved and she is satisfied.  Picture(s) obtained of the patient and placed in the chart were with the patient's or guardian's permission.

## 2021-12-03 ENCOUNTER — Ambulatory Visit (INDEPENDENT_AMBULATORY_CARE_PROVIDER_SITE_OTHER): Payer: Medicare HMO

## 2021-12-03 DIAGNOSIS — R7303 Prediabetes: Secondary | ICD-10-CM

## 2021-12-03 DIAGNOSIS — E782 Mixed hyperlipidemia: Secondary | ICD-10-CM

## 2021-12-03 DIAGNOSIS — I10 Essential (primary) hypertension: Secondary | ICD-10-CM

## 2021-12-03 NOTE — Progress Notes (Signed)
Chronic Care Management Pharmacy Note  12/09/2021 Name:  Nancy [name] MRN:  [id number] DOB:  1951/12/02  Summary: Patient reports that she is recovering from surgery.   Recommendations/Changes made from today's visit: Recommended that patient have a lipid panel completed.     Plan: Patient will be most comfortable with labs being completed in the beginning of the new year so she can improve her LDL with diet and exercise.    Subjective: Nancy [name] is an 70 y.o. year old female who is a primary patient of [name], [place].  The CCM team was consulted for assistance with disease management and care coordination needs.    Engaged with patient by telephone for follow up visit in response to provider referral for pharmacy case management and/or care coordination services. On Sunday her brother is going to have a party.   Consent to Services:  The patient was given information about Chronic Care Management services, agreed to services, and gave verbal consent prior to initiation of services.  Please see initial visit note for detailed documentation.   Patient Care Team: [name], FNP as PCP - General (General Practice) Jerline Pain, MD as PCP - Cardiology (Cardiology) Mayford Knife, [facility] (Pharmacist) Starlyn Skeans as Physician Assistant (Dermatology)  Recent office visits: 11/19/2021 PCP OV  Recent consult visits: 12/02/2021 [facility] visits: None in previous 6 months   Objective:  Lab Results  Component Value Date   CREATININE 1.15 (H) 11/19/2021   BUN 22 11/19/2021   GFRNONAA 55 (L) 10/16/2021   GFRAA 59 (L) 02/12/2021   NA 140 11/19/2021   K 5.1 11/19/2021   CALCIUM 9.7 11/19/2021   CO2 25 11/19/2021   GLUCOSE 87 11/19/2021    Lab Results  Component Value Date/Time   HGBA1C 5.8 (H) 09/18/2021 03:12 PM   HGBA1C 5.6 02/12/2021 02:12 PM   MICROALBUR 10 02/12/2021 12:47 PM   MICROALBUR 10 09/25/2019 04:40 PM     Last diabetic Eye exam:  Lab Results  Component Value Date/Time   HMDIABEYEEXA No Retinopathy 12/05/2020 12:00 AM    Last diabetic Foot exam: No results found for: HMDIABFOOTEX   Lab Results  Component Value Date   CHOL 216 (H) 02/12/2021   HDL 69 02/12/2021   LDLCALC 133 (H) 02/12/2021   TRIG 79 02/12/2021   CHOLHDL 3.1 02/12/2021    Hepatic Function Latest Ref Rng & Units 09/18/2021 02/12/2021 07/09/2020  Total Protein 6.0 - 8.5 g/dL 7.6 7.3 7.2  Albumin 3.8 - 4.8 g/dL 4.7 4.6 4.5  AST 0 - 40 IU/L 33 23 24  ALT 0 - 32 IU/L 26 14 17   Alk Phosphatase 44 - 121 IU/L 84 69 63  Total Bilirubin 0.0 - 1.2 mg/dL 0.5 0.6 0.7    Lab Results  Component Value Date/Time   TSH 0.648 09/18/2021 03:12 PM   TSH 2.500 04/10/2021 10:02 AM   FREET4 1.40 02/12/2021 02:12 PM    CBC Latest Ref Rng & Units 11/19/2021 10/16/2021 09/18/2021  WBC 3.4 - 10.8 x10E3/uL 9.0 5.3 6.6  Hemoglobin 11.1 - 15.9 g/dL 12.2 14.6 14.1  Hematocrit 34.0 - 46.6 % 38.2 46.2(H) 43.0  Platelets 150 - 450 x10E3/uL 275 211 249    No results found for: VD25OH  Clinical ASCVD: Yes  The ASCVD Risk score (Arnett DK, et al., 2019) failed to calculate for the following reasons:   Unable to determine if patient is Non-Hispanic African American  Depression screen [facility] 2/9 01/16/2021 04/10/2020 01/11/2020  Decreased Interest 0 0 0  Down, Depressed, Hopeless 0 0 0  PHQ - 2 Score 0 0 0  Altered sleeping - - 0  Tired, decreased energy - - 0  Change in appetite - - 0  Feeling bad or failure about yourself  - - 0  Trouble concentrating - - 0  Moving slowly or fidgety/restless - - 0  Suicidal thoughts - - 0  PHQ-9 Score - - 0  Difficult doing work/chores - - Not difficult at all     Social History   Tobacco Use  Smoking Status Former   Types: Cigarettes   Quit date: 2010   Years since quitting: 12.9  Smokeless Tobacco Never   BP Readings from Last 3 Encounters:  11/19/21 112/74  10/21/21 (!) 93/48  10/16/21 (!)  115/57   Pulse Readings from Last 3 Encounters:  11/19/21 71  10/21/21 65  10/16/21 (!) 55   Wt Readings from Last 3 Encounters:  11/19/21 221 lb 6.4 oz (100.4 kg)  10/20/21 225 lb (102.1 kg)  10/16/21 227 lb (103 kg)   BMI Readings from Last 3 Encounters:  11/19/21 36.84 kg/m  10/20/21 37.44 kg/m  10/16/21 37.77 kg/m    Assessment/Interventions: Review of patient past medical history, allergies, medications, health status, including review of consultants reports, laboratory and other test data, was performed as part of comprehensive evaluation and provision of chronic care management services.   SDOH:  (Social Determinants of Health) assessments and interventions performed: No  SDOH Screenings   Alcohol Screen: Not on file  Depression (PHQ2-9): Low Risk    PHQ-2 Score: 0  Financial Resource Strain: Low Risk    Difficulty of Paying Living Expenses: Not hard at all  Food Insecurity: No Food Insecurity   Worried About Charity fundraiser in the Last Year: Never true   Ran Out of Food in the Last Year: Never true  Housing: Not on file  Physical Activity: Sufficiently Active   Days of Exercise per Week: 7 days   Minutes of Exercise per Session: 120 min  Social Connections: Not on file  Stress: No Stress Concern Present   Feeling of Stress : Not at all  Tobacco Use: Medium Risk   Smoking Tobacco Use: Former   Smokeless Tobacco Use: Never   Passive Exposure: Not on file  Transportation Needs: No Transportation Needs   Lack of Transportation (Medical): No   Lack of Transportation (Non-Medical): No    CCM Care Plan  No Known Allergies  Medications Reviewed Today     Reviewed by Mayford Knife, RPH (Pharmacist) on 12/03/21 at 1407  Med List Status: <None>   Medication Order Taking? Sig Documenting Provider Last Dose Status Informant  Accu-Chek Softclix Lancets lancets [id number] Yes Use to check blood sugars daily E11.69 [name], FNP Taking Active Self   amoxicillin (AMOXIL) 875 MG tablet [id number] Yes Take 1 tablet (875 mg total) by mouth 2 (two) times daily. [name], FNP Taking Active   atorvastatin (LIPITOR) 20 MG tablet [id number] Yes TAKE 1 TABLET BY MOUTH EVERY NIGHT FOR CHOLESTEROL [name], FNP Taking Active Self  azelastine (OPTIVAR) 0.05 % ophthalmic solution [id number] Yes INSTILL ONE DROP IN [facility] EYE TWICE DAILY  Patient taking differently: Place 1 drop into both eyes 2 (two) times daily.   [name], FNP Taking Active Self  blood glucose meter kit and supplies [id number] Yes Dispense based on patient and insurance preference.  Use up to four times daily as directed. (FOR ICD-10 E10.9, E11.9). [name], FNP Taking Active Self  Blood Glucose Monitoring Suppl (ACCU-CHEK GUIDE) w/Device KIT [id number] Yes 1 Device by Does not apply route daily. [name], FNP Taking Active Self  Blood Pressure Monitor KIT [id number] Yes USE TO CHECK BLOOD PRESSURE 110 [name], FNP Taking Active Self  Cholecalciferol 125 MCG (5000 UT) TABS [id number] Yes Take 5,000 Units by mouth daily. [provider] Taking Active Self  clobetasol (TEMOVATE) 0.05 % external solution [id number] Yes Apply to scalp nightly Sheffield, Kelli R, PA-C Taking Active Self  clobetasol ointment (TEMOVATE) 0.05 % [id number] Yes Apply to elbows knees and back behind ears nightly Sheffield, Kelli R, PA-C Taking Active Self  desoximetasone (TOPICORT) 0.25 % cream [id number] Yes Apply to skin daily safe to use on face and skin folds Warren Danes, PA-C Taking Active Self  GLUCOSAMINE CHONDROITIN COMPLX PO [id number] Yes Take 1 capsule by mouth daily. 1500 mg/1200 mg [provider] Taking Active Self  Lactobacillus (PROBIOTIC ACIDOPHILUS PO) [id number] Yes Take 0.5 mg by mouth daily. [provider] Taking Active Self  levothyroxine (SYNTHROID) 150 MCG tablet [id number] Yes Take 1 tablet (150 mcg total) by mouth daily before breakfast.  [name], FNP Taking Active Self  Magnesium 250 MG TABS [id number] Yes Take 250 mg by mouth daily. [provider] Taking Active Self  Multiple Vitamins-Minerals (MULTIVITAMIN WOMEN 50+) TABS [id number] Yes Take 1 tablet by mouth daily. [provider] Taking Active Self  Donald Siva test strip [id number] Yes USE AS INSTRUCTED [name], FNP Taking Active Self  Scar Treatment Products (STRATA TRIZ) GEL [id number] Yes Apply 0.5 inches topically daily. Corena Herter, PA-C Taking Active   spironolactone (ALDACTONE) 50 MG tablet [id number] Yes Take 1 tablet (50 mg total) by mouth daily. [name], FNP Taking Active Self  tiZANidine (ZANAFLEX) 2 MG tablet [id number] No Take 1 tablet (2 mg total) by mouth every 8 (eight) hours as needed for muscle spasms.  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   Bifascicular block 02/08/2020   Family history of early CAD 02/08/2020   Prediabetes 09/25/2019   Dry eyes 09/25/2019   Mixed hyperlipidemia 09/25/2019   Essential hypertension 09/25/2019    Immunization History  Administered Date(s) Administered   Influenza, High Dose Seasonal PF 08/13/2019   Influenza,inj,Quad PF,6+ Mos 08/13/2019   Influenza-Unspecified 09/11/2021   PFIZER Comirnaty(Gray Top)Covid-19 Tri-Sucrose Vaccine 06/12/2021   PFIZER(Purple Top)SARS-COV-2 Vaccination 02/16/2020, 03/13/2020, 10/03/2020, 06/12/2021   Pfizer Covid-19 Vaccine Bivalent Booster 49yr & up 10/12/2021   Tdap 01/11/2020   Zoster Recombinat (Shingrix) 08/29/2020, 11/06/2020    Conditions to be addressed/monitored:  Hypertension and Hyperlipidemia  Care Plan : [place] Updates made by PMayford Knife RPH since 12/09/2021 12:00 AM     Problem:  HTN, Prediabetes, HLD   Priority: High     Long-Range Goal: Disease Management   Recent Progress: On track  Priority: High  Note:    Current Barriers:  Unable to independently monitor therapeutic efficacy  Pharmacist Clinical Goal(s):  Patient will achieve adherence to monitoring guidelines and medication adherence to achieve therapeutic efficacy through collaboration with PharmD and provider.  Interventions: 1:1 collaboration with [name], FNP regarding development and update of comprehensive plan of care as evidenced by provider attestation and co-signature Inter-disciplinary care team collaboration (see longitudinal plan of care) Comprehensive medication review performed; medication list updated in electronic medical record  Hypertension (BP goal <130/80) -Controlled -Current treatment: Spironolactone 50 mg tablet once per day.  -Current home readings: patient reports that she does not check her BP at home  -Denies hypotensive/hypertensive symptoms -[place] heart brochure: [email or web address] -Educated on BP goals and benefits of medications for prevention of heart attack, stroke and kidney damage; Daily salt intake goal < 2300 mg; Exercise goal of 150 minutes per week; Importance of home blood pressure monitoring; Proper BP monitoring technique; -Counseled to monitor BP at home least twice per day, document, and provide log at future appointments -Recommended to continue current medication -Collaborate with PCP to have patients BP cuff prescription sent to the pharmacy   Hyperlipidemia: (LDL goal < 100) -Uncontrolled -Current treatment: Atorvastatin 20 mg tablet once per day  -Current dietary patterns: patient is avoiding fried and fatty foods -Current exercise habits: Patient reports exercising every day - she walks 4 miles on a treadmill and it takes her 2 hours, she is working on decreasing the time.  -Educated on Cholesterol goals;   Benefits of statin for ASCVD risk reduction; Importance of limiting foods high in cholesterol; Exercise goal of 150 minutes per week; -Recommended to continue current medication  PreDiabetes (A1c goal <6.5%) -Not ideally controlled -Current home glucose readings fasting glucose: 75-95 -Denies hypoglycemic/hyperglycemic symptoms -Current meal patterns:  breakfast: soup or salad , or fruit lunch: Tofu, or oatmeal, oatmeal muffin dinner: Tofu with vegetables, and she eats a lot of cheese, sometimes she eats red meat when at someone's house  snacks: sliced [place] from the deli sections drinks: water, coffee, tea - sometimes adding flavoring, she mostly drinks water or coffee - with cream and equal  She has cut back on rice, bread and carbohydrates   -Educated on Complications of diabetes including kidney damage, retinal damage, and cardiovascular disease; Carbohydrate counting and/or plate method -Counseled to check feet daily and get yearly eye exams -Counseled on diet and exercise extensively Recommended to continue current medication Add a healthy meal plan option   Patient Goals/Self-Care Activities Patient will:  - take medications as prescribed  Follow Up Plan: The patient has been provided with contact information for the care management team and has been advised to call with any health related questions or concerns.       Medication Assistance: None required.  Patient affirms current coverage meets needs.  Compliance/Adherence/Medication fill history: Care Gaps: Pneumonia Vaccine Foot Exam  Star-Rating Drugs: Atorvastatin 20 mg tablet   Patient's preferred pharmacy is:  [id number] [address] [place]Phone: [phone]: [address] [address] [place]Phone: [phone]:  [phone] Uses pill box? Yes Pt endorses 95% compliance  We discussed: Benefits of medication synchronization, packaging and delivery as well as enhanced pharmacist oversight with [facility]. Patient decided to continue to: Utilize [facility] pharmacy for medication synchronization, packaging and delivery  Care Plan and Follow Up Patient Decision:  Patient agrees to Care Plan and Follow-up.  Plan: The patient has been provided with contact information for the care management team and has been advised to call with any health related questions or  concerns.   Orlando Penner, CPP, PharmD Clinical Pharmacist Practitioner [facility] [phone]

## 2021-12-05 ENCOUNTER — Telehealth: Payer: Self-pay

## 2021-12-05 NOTE — Chronic Care Management (AMB) (Signed)
° ° °Chronic Care Management °Pharmacy Assistant  ° °Name: Nancy [name]  MRN: [id number] DOB: 07/15/1951 ° °Reason for Encounter: Patient Assistance Coordination ° °12/05/2021- Called [employer] to check and see if a blood pressure monitor is covered under medical benefits. Spoke with representative who checked patient benefits and blood pressure machine is covered under [employer], patient will have a 20% co-insurance that she will have to pay once. Called patient to inform, no answer, left message to return call.  °  °12/08/2021- Spoke with patient, she is aware that I will work on getting [facility] order for blood pressure monitor. Patient aware of insurance instructions and copay, will follow up with patient on status.  ° °12/09/2021- Order placed at [facility] through [facility], awaiting provider signature on order in begin delivery process of blood pressure monitor to patient.   ° °12/16/2021- Follow up on order from [facility], [facility] is unable to fulfil this order for patient, they do  not carry blood pressure monitors, I reviewed other Suppliers that are in network with her insurance but none of them carry blood pressure monitor either. Called [facility] whom are located in [place], spoke with clerk and she informed me that they do have blood pressure monitors but they do not file patient's insurance [employer] Medicare, they are only filing Medicare A/B insurance. She informed me that they do have blood pressure machines available from $42 up to $92. °Called patient to inform. Patient aware that she will have to pay out of pocket and get reimbursed from [employer]. Patient aware of supply store name, address and phone number. Patient understands that she will have to purchase a monitor and very thankful for the help. Patient aware I will request an signed order to be placed at the front desk when she is ready to pick up to send along  with receipt to insurance for reimbursement.  ° ° °Medications: °Outpatient Encounter Medications as of 12/05/2021  °Medication Sig  ° Accu-Chek Softclix Lancets lancets Use to check blood sugars daily E11.69  ° amoxicillin (AMOXIL) 875 MG tablet Take 1 tablet (875 mg total) by mouth 2 (two) times daily.  ° atorvastatin (LIPITOR) 20 MG tablet TAKE 1 TABLET BY MOUTH EVERY NIGHT FOR CHOLESTEROL  ° azelastine (OPTIVAR) 0.05 % ophthalmic solution INSTILL ONE DROP IN EACH EYE TWICE DAILY (Patient taking differently: Place 1 drop into both eyes 2 (two) times daily.)  ° blood glucose meter kit and supplies Dispense based on patient and insurance preference. Use up to four times daily as directed. (FOR ICD-10 E10.9, E11.9).  ° Blood Glucose Monitoring Suppl (ACCU-CHEK GUIDE) w/Device KIT 1 Device by Does not apply route daily.  ° Blood Pressure Monitor KIT USE TO CHECK BLOOD PRESSURE 110  ° Cholecalciferol 125 MCG (5000 UT) TABS Take 5,000 Units by mouth daily.  ° clobetasol (TEMOVATE) 0.05 % external solution Apply to scalp nightly  ° clobetasol ointment (TEMOVATE) 0.05 % Apply to elbows knees and back behind ears nightly  ° desoximetasone (TOPICORT) 0.25 % cream Apply to skin daily safe to use on face and skin folds  ° GLUCOSAMINE CHONDROITIN COMPLX PO Take 1 capsule by mouth daily. 1500 mg/1200 mg  ° Lactobacillus (PROBIOTIC ACIDOPHILUS PO) Take 0.5 mg by mouth daily.  ° levothyroxine (SYNTHROID) 150 MCG tablet Take 1 tablet (150 mcg total) by mouth daily before breakfast.  ° Magnesium 250 MG TABS Take 250 mg by mouth daily.  ° Multiple Vitamins-Minerals (MULTIVITAMIN WOMEN   WOMEN 50+) TABS Take 1 tablet by mouth daily.   ONETOUCH ULTRA test strip USE AS INSTRUCTED   Scar Treatment Products (STRATA TRIZ) GEL Apply 0.5 inches topically daily.   spironolactone (ALDACTONE) 50 MG tablet Take 1 tablet (50 mg total) by mouth daily.   tiZANidine (ZANAFLEX) 2 MG tablet Take 1 tablet (2 mg total) by mouth every 8 (eight) hours as  needed for muscle spasms. (Patient not taking: Reported on 12/03/2021)   No facility-administered encounter medications on file as of 12/05/2021.    Pattricia Boss, [place] Pharmacist Assistant [phone]

## 2021-12-08 DIAGNOSIS — H5201 Hypermetropia, right eye: Secondary | ICD-10-CM | POA: Diagnosis not present

## 2021-12-08 DIAGNOSIS — H52223 Regular astigmatism, bilateral: Secondary | ICD-10-CM | POA: Diagnosis not present

## 2021-12-08 DIAGNOSIS — H524 Presbyopia: Secondary | ICD-10-CM | POA: Diagnosis not present

## 2021-12-08 LAB — HM DIABETES EYE EXAM

## 2021-12-09 ENCOUNTER — Encounter: Payer: Self-pay | Admitting: Nurse Practitioner

## 2021-12-09 NOTE — Patient Instructions (Addendum)
Visit Information It was great speaking with you today!  Please let me know if you have any questions about our visit.   Goals Addressed             This Visit's Progress    Track and Manage My Blood Pressure-Hypertension       Timeframe:  Long-Range Goal Priority:  High Start Date:                             Expected End Date:                       Follow Up Date 01/14/2022  In Progress:  - check blood pressure 3 times per week - choose a place to take my blood pressure (home, clinic or office, retail store) - write blood pressure results in a log or diary    Why is this important?   You won't feel high blood pressure, but it can still hurt your blood vessels.  High blood pressure can cause heart or kidney problems. It can also cause a stroke.  Making lifestyle changes like losing a little weight or eating less salt will help.  Checking your blood pressure at home and at different times of the day can help to control blood pressure.  If the doctor prescribes medicine remember to take it the way the doctor ordered.  Call the office if you cannot afford the medicine or if there are questions about it.             Patient Care Plan: CCM Pharmacy Care Plan     Problem Identified: HTN, Prediabetes, HLD   Priority: High     Long-Range Goal: Disease Management   Recent Progress: On track  Priority: High  Note:    Current Barriers:  Unable to independently monitor therapeutic efficacy  Pharmacist Clinical Goal(s):  Patient will achieve adherence to monitoring guidelines and medication adherence to achieve therapeutic efficacy through collaboration with PharmD and provider.   Interventions: 1:1 collaboration with [name], FNP regarding development and update of comprehensive plan of care as evidenced by provider attestation and co-signature Inter-disciplinary care team collaboration (see longitudinal plan of care) Comprehensive medication review performed;  medication list updated in electronic medical record  Hypertension (BP goal <130/80) -Controlled -Current treatment: Spironolactone 50 mg tablet once per day.  -Current home readings: patient reports that she does not check her BP at home  -Denies hypotensive/hypertensive symptoms -[place] heart brochure: [email or web address] -Educated on BP goals and benefits of medications for prevention of heart attack, stroke and kidney damage; Daily salt intake goal < 2300 mg; Exercise goal of 150 minutes per week; Importance of home blood pressure monitoring; Proper BP monitoring technique; -Counseled to monitor BP at home least twice per day, document, and provide log at future appointments -Recommended to continue current medication -Collaborate with PCP to have patients BP cuff prescription sent to the pharmacy   Hyperlipidemia: (LDL goal < 100) -Uncontrolled -Current treatment: Atorvastatin 20 mg tablet once per day  -Current dietary patterns: patient is avoiding fried and fatty foods -Current exercise habits: Patient reports exercising every day - she walks 4 miles on a treadmill and it takes her 2 hours, she is working on decreasing the time.  -Educated on Cholesterol goals;  Benefits of statin for ASCVD risk reduction; Importance of limiting foods high in cholesterol; Exercise goal of 150 minutes per week; -Recommended to  continue current medication  PreDiabetes (A1c goal <6.5%) -Not ideally controlled -Current home glucose readings fasting glucose: 75-95 -Denies hypoglycemic/hyperglycemic symptoms -Current meal patterns:  breakfast: soup or salad , or fruit lunch: Tofu, or oatmeal, oatmeal muffin dinner: Tofu with vegetables, and she eats a lot of cheese, sometimes she eats red meat when at someone's house  snacks: sliced [place] from the deli sections drinks: water, coffee, tea - sometimes adding flavoring, she mostly drinks water or coffee - with  cream and equal  She has cut back on rice, bread and carbohydrates   -Educated on Complications of diabetes including kidney damage, retinal damage, and cardiovascular disease; Carbohydrate counting and/or plate method -Counseled to check feet daily and get yearly eye exams -Counseled on diet and exercise extensively Recommended to continue current medication Add a healthy meal plan option   Patient Goals/Self-Care Activities Patient will:  - take medications as prescribed  Follow Up Plan: The patient has been provided with contact information for the care management team and has been advised to call with any health related questions or concerns.       Patient agreed to services and verbal consent obtained.   The patient verbalized understanding of instructions, educational materials, and care plan provided today and agreed to receive a mailed copy of patient instructions, educational materials, and care plan.   Orlando Penner, PharmD Clinical Pharmacist [facility] [phone]

## 2021-12-11 ENCOUNTER — Encounter: Payer: Self-pay | Admitting: Nurse Practitioner

## 2021-12-11 DIAGNOSIS — J302 Other seasonal allergic rhinitis: Secondary | ICD-10-CM | POA: Diagnosis not present

## 2021-12-11 DIAGNOSIS — Z604 Social exclusion and rejection: Secondary | ICD-10-CM | POA: Diagnosis not present

## 2021-12-11 DIAGNOSIS — Z8249 Family history of ischemic heart disease and other diseases of the circulatory system: Secondary | ICD-10-CM | POA: Diagnosis not present

## 2021-12-11 DIAGNOSIS — E785 Hyperlipidemia, unspecified: Secondary | ICD-10-CM | POA: Diagnosis not present

## 2021-12-11 DIAGNOSIS — R69 Illness, unspecified: Secondary | ICD-10-CM | POA: Diagnosis not present

## 2021-12-11 DIAGNOSIS — E669 Obesity, unspecified: Secondary | ICD-10-CM | POA: Diagnosis not present

## 2021-12-11 DIAGNOSIS — Z833 Family history of diabetes mellitus: Secondary | ICD-10-CM | POA: Diagnosis not present

## 2021-12-11 DIAGNOSIS — L409 Psoriasis, unspecified: Secondary | ICD-10-CM | POA: Diagnosis not present

## 2021-12-11 DIAGNOSIS — M1990 Unspecified osteoarthritis, unspecified site: Secondary | ICD-10-CM | POA: Diagnosis not present

## 2021-12-11 DIAGNOSIS — Z87891 Personal history of nicotine dependence: Secondary | ICD-10-CM | POA: Diagnosis not present

## 2021-12-11 DIAGNOSIS — I10 Essential (primary) hypertension: Secondary | ICD-10-CM | POA: Diagnosis not present

## 2021-12-11 DIAGNOSIS — E039 Hypothyroidism, unspecified: Secondary | ICD-10-CM | POA: Diagnosis not present

## 2021-12-20 DIAGNOSIS — I10 Essential (primary) hypertension: Secondary | ICD-10-CM

## 2021-12-20 DIAGNOSIS — E782 Mixed hyperlipidemia: Secondary | ICD-10-CM

## 2021-12-20 DIAGNOSIS — R7303 Prediabetes: Secondary | ICD-10-CM

## 2021-12-23 ENCOUNTER — Other Ambulatory Visit: Payer: Self-pay

## 2021-12-23 ENCOUNTER — Telehealth: Payer: Self-pay

## 2021-12-23 DIAGNOSIS — E039 Hypothyroidism, unspecified: Secondary | ICD-10-CM

## 2021-12-23 DIAGNOSIS — I10 Essential (primary) hypertension: Secondary | ICD-10-CM

## 2021-12-23 MED ORDER — ATORVASTATIN CALCIUM 20 MG PO TABS: 20.0000 mg | ORAL_TABLET | Freq: Every day | ORAL | 1 refills | Status: DC

## 2021-12-23 MED ORDER — LEVOTHYROXINE SODIUM 150 MCG PO TABS: 150.0000 ug | ORAL_TABLET | Freq: Every day | ORAL | 1 refills | Status: DC

## 2021-12-23 MED ORDER — AZELASTINE HCL 0.05 % OP SOLN: OPHTHALMIC | 3 refills | Status: DC

## 2021-12-23 MED ORDER — SPIRONOLACTONE 50 MG PO TABS: 50.0000 mg | ORAL_TABLET | Freq: Every day | ORAL | 1 refills | Status: DC

## 2021-12-23 NOTE — Chronic Care Management (AMB) (Signed)
Chronic Care Management Pharmacy Assistant   Name: Nancy [name]  MRN: [id number] DOB: 1951/02/16  Reason for Encounter: Medication Review/ Medication Coordination Call   Recent office visits:  12/03/2021- Orlando Penner, Clinical Pharmacist (CCM)  Recent consult visits:  None  Hospital visits:  Medication Reconciliation was completed by comparing discharge summary, patients EMR and Pharmacy list, and upon discussion with patient.  Admitted to the hospital on 10/20/2021 due to Panniculitis . Discharge date was 10/21/2021. Discharged from [place]?Medications Started at [facility] Discharge:?? -started: None  Medication Changes at Hospital Discharge: -Changed: None  Medications Discontinued at Hospital Discharge: -Stopped: None  Medications that remain the same after Hospital Discharge:??  -All other medications will remain the same.    Medications: Outpatient Encounter Medications as of 12/23/2021  Medication Sig   Accu-Chek Softclix Lancets lancets Use to check blood sugars daily E11.69   amoxicillin (AMOXIL) 875 MG tablet Take 1 tablet (875 mg total) by mouth 2 (two) times daily.   atorvastatin (LIPITOR) 20 MG tablet TAKE 1 TABLET BY MOUTH EVERY NIGHT FOR CHOLESTEROL   azelastine (OPTIVAR) 0.05 % ophthalmic solution INSTILL ONE DROP IN [facility] EYE TWICE DAILY (Patient taking differently: Place 1 drop into both eyes 2 (two) times daily.)   blood glucose meter kit and supplies Dispense based on patient and insurance preference. Use up to four times daily as directed. (FOR ICD-10 E10.9, E11.9).   Blood Glucose Monitoring Suppl (ACCU-CHEK GUIDE) w/Device KIT 1 Device by Does not apply route daily.   Blood Pressure Monitor KIT USE TO CHECK BLOOD PRESSURE 110   Cholecalciferol 125 MCG (5000 UT) TABS Take 5,000 Units by mouth daily.   clobetasol (TEMOVATE) 0.05 % external solution Apply to scalp nightly   clobetasol ointment (TEMOVATE) 0.05 % Apply to  elbows knees and back behind ears nightly   desoximetasone (TOPICORT) 0.25 % cream Apply to skin daily safe to use on face and skin folds   GLUCOSAMINE CHONDROITIN COMPLX PO Take 1 capsule by mouth daily. 1500 mg/1200 mg   Lactobacillus (PROBIOTIC ACIDOPHILUS PO) Take 0.5 mg by mouth daily.   levothyroxine (SYNTHROID) 150 MCG tablet Take 1 tablet (150 mcg total) by mouth daily before breakfast.   Magnesium 250 MG TABS Take 250 mg by mouth daily.   Multiple Vitamins-Minerals (MULTIVITAMIN WOMEN 50+) TABS Take 1 tablet by mouth daily.   ONETOUCH ULTRA test strip USE AS INSTRUCTED   Scar Treatment Products (STRATA TRIZ) GEL Apply 0.5 inches topically daily.   spironolactone (ALDACTONE) 50 MG tablet Take 1 tablet (50 mg total) by mouth daily.   tiZANidine (ZANAFLEX) 2 MG tablet Take 1 tablet (2 mg total) by mouth every 8 (eight) hours as needed for muscle spasms. (Patient not taking: Reported on 12/03/2021)   No facility-administered encounter medications on file as of 12/23/2021.   Reviewed chart for medication changes ahead of medication coordination call.  No medication changes indicated  BP Readings from Last 3 Encounters:  11/19/21 112/74  10/21/21 (!) 93/48  10/16/21 (!) 115/57    Lab Results  Component Value Date   HGBA1C 5.8 (H) 09/18/2021     Patient obtains medications through Vials  90 Days   Last adherence delivery included: Atorvastatin 20 mg - 1 tablet daily at bedtime Levothyroxine 150 mcg- 1 tablet daily before breakfast Spironolactone 50 mg- 1 tablet  daily Onetouch test strips- Use as directed Onetouch lancets- Use as directed   Patient declined the following medications last  delivery: None  Patient is due for next adherence delivery on: 01/01/2022. Called patient and reviewed medications and coordinated delivery.  This delivery to include: Atorvastatin 20 mg - 1 tablet daily at bedtime Levothyroxine 150 mcg- 1 tablet daily before breakfast Spironolactone 50  mg- 1 tablet  daily Azelastine drops 0.05%- 1 drop in each eye twice daily Onetouch test strips- Use as directed Onetouch lancets- Use as directed  No short fill or acute fill needed  Patient declined the following medications: Triamcinolone 0.1 % cream- apply thin layer topically as needed- PRN use Clobetasol 0.05 % scalp solution- apply to scalp nightly- Due to not using daily-sufficient supply Desoximetasone 0.25 %Topical cream- apply to skin daily-  Due to not using daily-sufficient supply Ketoconazole Cream 2% cream- apply topically twice daily under breast- Due to not using daily-sufficient supply   Patient needs refills for: Atorvastatin, Levothyroxine, Spironolactone and Azelastine drops- Request sent to CMA.  Confirmed delivery date of 01/01/2022, advised patient that pharmacy will contact them the morning of delivery.    Care Gaps: Pneumonia Vaccine 66+ Years old- Overdue - never done  FOOT EXAM Ammie Dalton completed: Jan 11, 2020 URINE MICROALBUMIN Ammie Dalton completed: Feb 12, 2021 Last DM Eye Exam- 12/05/2020- No Diabetic Retinopathy- [facility] (See media) Last Mammogram- 04/23/2021 Last Dexa Scan- 10/14/2020 Last Colonoscopy- 09/01/2018 Last AWV 01/16/2021- Next once scheduled 12/31/2021  Follow up visit with CCM Pharmacist Orlando Penner 01/14/2022 Follow up visit with [name], FNP 02/18/2022    Star Rating Drugs: None  Pattricia Boss, [place] Pharmacist Assistant [phone]

## 2021-12-31 ENCOUNTER — Telehealth: Payer: Self-pay

## 2021-12-31 ENCOUNTER — Ambulatory Visit (INDEPENDENT_AMBULATORY_CARE_PROVIDER_SITE_OTHER): Payer: Medicare HMO

## 2021-12-31 VITALS — Ht 65.0 in | Wt 217.0 lb

## 2021-12-31 DIAGNOSIS — Z0000 Encounter for general adult medical examination without abnormal findings: Secondary | ICD-10-CM

## 2021-12-31 NOTE — Progress Notes (Signed)
I connected with Nancy [name] today by telephone and verified that I am speaking with the correct person using two identifiers. Location patient: home Location provider: work Persons participating in the virtual visit: [place], [place] LPN.   I discussed the limitations, risks, security and privacy concerns of performing an evaluation and management service by telephone and the availability of in person appointments. I also discussed with the patient that there may be a patient responsible charge related to this service. The patient expressed understanding and verbally consented to this telephonic visit.    Interactive audio and video telecommunications were attempted between this provider and patient, however failed, due to patient having technical difficulties OR patient did not have access to video capability.  We continued and completed visit with audio only.     Vital signs may be patient reported or missing.  Subjective:   Nancy [name] is a 71 y.o. female who presents for Medicare Annual (Subsequent) preventive examination.  Review of Systems     Cardiac Risk Factors include: advanced age (>58mn, >>28women);dyslipidemia;hypertension;obesity (BMI >30kg/m2)     Objective:    Today's Vitals   12/31/21 1537  Weight: 217 lb (98.4 kg)  Height: 5' 5"  (1.651 m)   Body mass index is 36.11 kg/m.  Advanced Directives 12/31/2021 10/20/2021 10/16/2021 01/16/2021 01/11/2020  Does Patient Have a Medical Advance Directive? No No No No No  Would patient like information on creating a medical advance directive? - No - Patient declined Yes (MAU/Ambulatory/Procedural Areas - Information given) - Yes (MAU/Ambulatory/Procedural Areas - Information given)    Current Medications (verified) Outpatient Encounter Medications as of 12/31/2021  Medication Sig   Accu-Chek Softclix Lancets lancets Use to check blood sugars daily E11.69   atorvastatin (LIPITOR) 20 MG tablet Take 1 tablet (20  mg total) by mouth daily.   azelastine (OPTIVAR) 0.05 % [facility] Strength: 0.05 %   blood glucose meter kit and supplies Dispense based on patient and insurance preference. Use up to four times daily as directed. (FOR ICD-10 E10.9, E11.9).   Blood Glucose Monitoring Suppl (ACCU-CHEK GUIDE) w/Device KIT 1 Device by Does not apply route daily.   Cholecalciferol 125 MCG (5000 UT) TABS Take 5,000 Units by mouth daily.   clobetasol (TEMOVATE) 0.05 % external solution Apply to scalp nightly   clobetasol ointment (TEMOVATE) 0.05 % Apply to elbows knees and back behind ears nightly   desoximetasone (TOPICORT) 0.25 % cream Apply to skin daily safe to use on face and skin folds   GLUCOSAMINE CHONDROITIN COMPLX PO Take 1 capsule by mouth daily. 1500 mg/1200 mg   Lactobacillus (PROBIOTIC ACIDOPHILUS PO) Take 0.5 mg by mouth daily.   levothyroxine (SYNTHROID) 150 MCG tablet Take 1 tablet (150 mcg total) by mouth daily before breakfast.   Magnesium 250 MG TABS Take 250 mg by mouth daily.   Multiple Vitamins-Minerals (MULTIVITAMIN WOMEN 50+) TABS Take 1 tablet by mouth daily.   ONETOUCH ULTRA test strip USE AS INSTRUCTED   Scar Treatment Products (STRATA TRIZ) GEL Apply 0.5 inches topically daily.   spironolactone (ALDACTONE) 50 MG tablet Take 1 tablet (50 mg total) by mouth daily.   amoxicillin (AMOXIL) 875 MG tablet Take 1 tablet (875 mg total) by mouth 2 (two) times daily.   Blood Pressure Monitor KIT USE TO CHECK BLOOD PRESSURE 110 (Patient not taking: Reported on 12/31/2021)   tiZANidine (ZANAFLEX) 2 MG tablet Take 1 tablet (2 mg total) by mouth  every 8 (eight) hours as needed for muscle spasms. (Patient not taking: Reported on 12/03/2021)   No facility-administered encounter medications on file as of 12/31/2021.    Allergies (verified) Patient has no known allergies.   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   Complication of anesthesia    pt states she woke up during a tonsillectomy when she was 28   Hyperlipidemia    Hypertension    Hypothyroidism 1980   Prediabetes    Thyroid disease    Past Surgical History:  Procedure Laterality Date   BREAST SURGERY     CHOLECYSTECTOMY     NASAL SEPTUM SURGERY  1990   PANNICULECTOMY N/A 10/20/2021   Procedure: PANNICULECTOMY;  Surgeon: Wallace Going, DO;  Location: [facility];  Service: Plastics;  Laterality: N/A;  3.5 hours   REDUCTION MAMMAPLASTY     TONSILLECTOMY     VENTRAL HERNIA REPAIR N/A 10/20/2021   Procedure: HERNIA REPAIR VENTRAL ADULT WITH MESH;  Surgeon: Coralie Keens, MD;  Location: [facility];  Service: General;  Laterality: N/A;   Family History  Problem Relation Age of Onset   Diabetes Mother    Hypertension Mother    Hypertension Father    Heart disease Father    Diabetes Sister    Kidney failure Sister    Diabetes Brother    Kidney disease Brother    Diabetes Sister    Diabetes Sister    Asthma Sister    Cancer Maternal Grandfather    Cancer Paternal Grandfather    Social History   Socioeconomic History   Marital status: Unknown    Spouse name: Not on file   Number of children: Not on file   Years of education: Not on file   Highest education level: Not on file  Occupational History   Occupation: retired  Tobacco Use   Smoking status: Former    Types: Cigarettes    Quit date: 2010    Years since quitting: 13.0   Smokeless tobacco: Never  Vaping Use   Vaping Use: Never used  Substance and Sexual Activity   Alcohol use: Not Currently   Drug use: Never   Sexual activity: Not Currently  Other Topics Concern   Not on file  Social History Narrative   Not on file   Social Determinants of Health   Financial Resource Strain: Low Risk    Difficulty of Paying Living Expenses: Not hard at all  Food Insecurity: No Food Insecurity   Worried About Paediatric nurse in the Last Year: Never true   Arboriculturist in the Last Year: Never true  Transportation Needs: No Transportation Needs   Lack of Transportation (Medical): No   Lack of Transportation (Non-Medical): No  Physical Activity: Sufficiently Active   Days of Exercise per Week: 7 days   Minutes of Exercise per Session: 90 min  Stress: No Stress Concern Present   Feeling of Stress : Only a little  Social Connections: Not on file    Tobacco Counseling Counseling given: Not Answered   Clinical Intake:  Pre-visit preparation completed: Yes  Pain : No/denies pain     Nutritional Status: BMI > 30  Obese Nutritional Risks: None Diabetes: No  How often do you need to have someone help you when you read instructions, pamphlets, or other written materials from your doctor or pharmacy?:  1 - Never What is the last grade level you completed in school?: masters degree  Diabetic? no  Interpreter Needed?: No  Information entered by :: NAllen LPN   Activities of Daily Living In your present state of health, do you have any difficulty performing the following activities: 12/31/2021 12/27/2021  Hearing? N N  Vision? N N  Difficulty concentrating or making decisions? N N  Walking or climbing stairs? N N  Dressing or bathing? N N  Doing errands, shopping? N N  Preparing Food and eating ? N N  Using the Toilet? N N  In the past six months, have you accidently leaked urine? N N  Do you have problems with loss of bowel control? N N  Managing your Medications? N N  Managing your Finances? N N  Housekeeping or managing your Housekeeping? N N  Some recent data might be hidden    Patient Care Team: [name], FNP as PCP - General (General Practice) Jerline Pain, MD as PCP - Cardiology (Cardiology) Mayford Knife, [facility] (Pharmacist) Warren Danes, PA-C as Physician Assistant (Dermatology)  Indicate any recent Medical Services you may have received from other than  [facility] providers in the past year (date may be approximate).     Assessment:   This is a routine wellness examination for [place].  Hearing/Vision screen Vision Screening - Comments:: Regular eye exams, [facility]  Dietary issues and exercise activities discussed: Current Exercise Habits: Home exercise routine, Type of exercise: walking, Time (Minutes): > 60, Frequency (Times/Week): 7, Weekly Exercise (Minutes/Week): 0   Goals Addressed             This Visit's Progress    Patient Stated       12/31/2021, wants to lose 10 pounds and keep A1C intact       Depression Screen PHQ 2/9 Scores 12/31/2021 01/16/2021 04/10/2020 01/11/2020 01/11/2020 12/06/2019 09/25/2019  PHQ - 2 Score 0 0 0 0 0 0 0  PHQ- 9 Score - - - 0 - - -    Fall Risk Fall Risk  12/31/2021 12/27/2021 01/16/2021 04/10/2020 01/11/2020  Falls in the past year? 0 0 0 0 1  Comment - - - - tripped  Number falls in past yr: - - - - -  Injury with Fall? - - - - -  Risk for fall due to : Medication side effect - Medication side effect - History of fall(s)  Follow up Falls evaluation completed;Education provided;Falls prevention discussed - Education provided;Falls evaluation completed;Falls prevention discussed - Falls evaluation completed;Education provided;Falls prevention discussed    FALL RISK PREVENTION PERTAINING TO THE HOME:  Any stairs in or around the home? No  If so, are there any without handrails?  N/a Home free of loose throw rugs in walkways, pet beds, electrical cords, etc? Yes  Adequate lighting in your home to reduce risk of falls? Yes   ASSISTIVE DEVICES UTILIZED TO PREVENT FALLS:  Life alert? No  Use of a cane, walker or w/c? No  Grab bars in the bathroom? No  Shower chair or bench in shower? No  Elevated toilet seat or a handicapped toilet? No   TIMED UP AND GO:  Was the test performed? No .      Cognitive Function:     6CIT Screen 12/31/2021 01/16/2021 01/11/2020  What Year? 0 points 0  points 0 points  What month? 0 points 0 points 0 points  What time? 0 points 0 points 0 points  Count back from 20 0 points 0 points 0 points  Months in reverse 0 points 0 points 0 points  Repeat phrase 0 points 2 points 0 points  Total Score 0 2 0    Immunizations Immunization History  Administered Date(s) Administered   Influenza, High Dose Seasonal PF 08/13/2019   Influenza,inj,Quad PF,6+ Mos 08/13/2019   Influenza-Unspecified 09/11/2021   PFIZER Comirnaty(Gray Top)Covid-19 Tri-Sucrose Vaccine 06/12/2021   PFIZER(Purple Top)SARS-COV-2 Vaccination 02/16/2020, 03/13/2020, 10/03/2020, 06/12/2021   PNEUMOCOCCAL CONJUGATE-20 12/26/2021   Pfizer Covid-19 Vaccine Bivalent Booster 75yr & up 10/12/2021   Tdap 01/11/2020   Zoster Recombinat (Shingrix) 08/29/2020, 11/06/2020    TDAP status: Up to date  Flu Vaccine status: Up to date  Pneumococcal vaccine status: Up to date  Covid-19 vaccine status: Completed vaccines  Qualifies for Shingles Vaccine? Yes   Zostavax completed No   Shingrix Completed?: Yes  Screening Tests Health Maintenance  Topic Date Due   FOOT EXAM  01/10/2021   URINE MICROALBUMIN  02/12/2022   HEMOGLOBIN A1C  03/18/2022   OPHTHALMOLOGY EXAM  12/08/2022   MAMMOGRAM  04/24/2023   COLONOSCOPY (Pts [phone] coverage will need to be confirmed)  09/01/2028   TETANUS/TDAP  01/10/2030   Pneumonia Vaccine 6556Years old  Completed   INFLUENZA VACCINE  Completed   DEXA SCAN  Completed   COVID-19 Vaccine  Completed   Hepatitis C Screening  Completed   Zoster Vaccines- Shingrix  Completed   HPV VACCINES  Aged Out    Health Maintenance  Health Maintenance Due  Topic Date Due   FOOT EXAM  01/10/2021   URINE MICROALBUMIN  02/12/2022    Colorectal cancer screening: Type of screening: Colonoscopy. Completed 09/01/2018. Repeat every 10 years  Mammogram status: Completed 04/23/2021. Repeat every year  Bone Density status: Completed 10/14/2020.  Lung  Cancer Screening: (Low Dose CT Chest recommended if Age 71-80ears, 30 pack-year currently smoking OR have quit w/in 15years.) does not qualify.   Lung Cancer Screening Referral: no  Additional Screening:  Hepatitis C Screening: does qualify; Completed 02/12/2021  Vision Screening: Recommended annual ophthalmology exams for early detection of glaucoma and other disorders of the eye. Is the patient up to date with their annual eye exam?  Yes  Who is the provider or what is the name of the office in which the patient attends annual eye exams? [facility] If pt is not established with a provider, would they like to be referred to a provider to establish care? No .   Dental Screening: Recommended annual dental exams for proper oral hygiene  Community Resource Referral / Chronic Care Management: CRR required this visit?  No   CCM required this visit?  No      Plan:     I have personally reviewed and noted the following in the patients chart:   Medical and social history Use of alcohol, tobacco or illicit drugs  Current medications and supplements including opioid prescriptions.  Functional ability and status Nutritional status Physical activity Advanced directives List of other physicians Hospitalizations, surgeries, and ER visits in previous 12 months Vitals Screenings to include cognitive, depression, and falls Referrals and appointments  In addition, I have reviewed and discussed with patient certain preventive protocols, quality metrics, and best practice recommendations. A written personalized care plan for preventive services as well as general preventive health recommendations were provided to patient.     NiKellie SimmeringLPN   12/23/53/9741 Nurse Notes: none

## 2021-12-31 NOTE — Telephone Encounter (Signed)
Patient's daughter notified that her forms have been completed and faxed. I have also scanned it to her email as requested. YL,RMA

## 2021-12-31 NOTE — Patient Instructions (Signed)
Nancy [name] , Thank you for taking time to come for your Medicare Wellness Visit. I appreciate your ongoing commitment to your health goals. Please review the following plan we discussed and let me know if I can assist you in the future.   Screening recommendations/referrals: Colonoscopy: completed 09/01/2018 Mammogram: completed 04/23/2021 Bone Density: completed 10/14/2020 Recommended yearly ophthalmology/optometry visit for glaucoma screening and checkup Recommended yearly dental visit for hygiene and checkup  Vaccinations: Influenza vaccine: completed 09/11/2021 Pneumococcal vaccine: completed 12/26/2021 Tdap vaccine: completed 01/11/2020, due 01/10/2030 Shingles vaccine: completed   Covid-19: 10/12/2021, 06/12/2021, 10/03/2020, 03/13/2020, 02/16/2020  Advanced directives: Advance directive discussed with you today.   Conditions/risks identified: none  Next appointment: Follow up in one year for your annual wellness visit    Preventive Care 65 Years and Older, Female Preventive care refers to lifestyle choices and visits with your health care provider that can promote health and wellness. What does preventive care include? A yearly physical exam. This is also called an annual well check. Dental exams once or twice a year. Routine eye exams. Ask your health care provider how often you should have your eyes checked. Personal lifestyle choices, including: Daily care of your teeth and gums. Regular physical activity. Eating a healthy diet. Avoiding tobacco and drug use. Limiting alcohol use. Practicing safe sex. Taking low-dose aspirin every day. Taking vitamin and mineral supplements as recommended by your health care provider. What happens during an annual well check? The services and screenings done by your health care provider during your annual well check will depend on your age, overall health, lifestyle risk factors, and family history of disease. Counseling  Your health care  provider may ask you questions about your: Alcohol use. Tobacco use. Drug use. Emotional well-being. Home and relationship well-being. Sexual activity. Eating habits. History of falls. Memory and ability to understand (cognition). Work and work Statistician. Reproductive health. Screening  You may have the following tests or measurements: Height, weight, and BMI. Blood pressure. Lipid and cholesterol levels. These may be checked every 5 years, or more frequently if you are over 55 years old. Skin check. Lung cancer screening. You may have this screening every year starting at age 83 if you have a 30-pack-year history of smoking and currently smoke or have quit within the past 15 years. Fecal occult blood test (FOBT) of the stool. You may have this test every year starting at age 44. Flexible sigmoidoscopy or colonoscopy. You may have a sigmoidoscopy every 5 years or a colonoscopy every 10 years starting at age 28. Hepatitis C blood test. Hepatitis B blood test. Sexually transmitted disease (STD) testing. Diabetes screening. This is done by checking your blood sugar (glucose) after you have not eaten for a while (fasting). You may have this done every 1-3 years. Bone density scan. This is done to screen for osteoporosis. You may have this done starting at age 46. Mammogram. This may be done every 1-2 years. Talk to your health care provider about how often you should have regular mammograms. Talk with your health care provider about your test results, treatment options, and if necessary, the need for more tests. Vaccines  Your health care provider may recommend certain vaccines, such as: Influenza vaccine. This is recommended every year. Tetanus, diphtheria, and acellular pertussis (Tdap, Td) vaccine. You may need a Td booster every 10 years. Zoster vaccine. You may need this after age 54. Pneumococcal 13-valent conjugate (PCV13) vaccine. One dose is recommended after age  45. Pneumococcal polysaccharide (PPSV23)  vaccine. One dose is recommended after age 71. Talk to your health care provider about which screenings and vaccines you need and how often you need them. This information is not intended to replace advice given to you by your health care provider. Make sure you discuss any questions you have with your health care provider. Document Released: 01/03/2016 Document Revised: 08/26/2016 Document Reviewed: 10/08/2015 Elsevier Interactive Patient Education  2017 [employer] Prevention in the Home Falls can cause injuries. They can happen to people of all ages. There are many things you can do to make your home safe and to help prevent falls. What can I do on the outside of my home? Regularly fix the edges of walkways and driveways and fix any cracks. Remove anything that might make you trip as you walk through a door, such as a raised step or threshold. Trim any bushes or trees on the path to your home. Use bright outdoor lighting. Clear any walking paths of anything that might make someone trip, such as rocks or tools. Regularly check to see if handrails are loose or broken. Make sure that both sides of any steps have handrails. Any raised decks and porches should have guardrails on the edges. Have any leaves, snow, or ice cleared regularly. Use sand or salt on walking paths during winter. Clean up any spills in your garage right away. This includes oil or grease spills. What can I do in the bathroom? Use night lights. Install grab bars by the toilet and in the tub and shower. Do not use towel bars as grab bars. Use non-skid mats or decals in the tub or shower. If you need to sit down in the shower, use a plastic, non-slip stool. Keep the floor dry. Clean up any water that spills on the floor as soon as it happens. Remove soap buildup in the tub or shower regularly. Attach bath mats securely with double-sided non-slip rug tape. Do not have throw  rugs and other things on the floor that can make you trip. What can I do in the bedroom? Use night lights. Make sure that you have a light by your bed that is easy to reach. Do not use any sheets or blankets that are too big for your bed. They should not hang down onto the floor. Have a firm chair that has side arms. You can use this for support while you get dressed. Do not have throw rugs and other things on the floor that can make you trip. What can I do in the kitchen? Clean up any spills right away. Avoid walking on wet floors. Keep items that you use a lot in easy-to-reach places. If you need to reach something above you, use a strong step stool that has a grab bar. Keep electrical cords out of the way. Do not use floor polish or wax that makes floors slippery. If you must use wax, use non-skid floor wax. Do not have throw rugs and other things on the floor that can make you trip. What can I do with my stairs? Do not leave any items on the stairs. Make sure that there are handrails on both sides of the stairs and use them. Fix handrails that are broken or loose. Make sure that handrails are as long as the stairways. Check any carpeting to make sure that it is firmly attached to the stairs. Fix any carpet that is loose or worn. Avoid having throw rugs at the top or bottom of  the stairs. If you do have throw rugs, attach them to the floor with carpet tape. Make sure that you have a light switch at the top of the stairs and the bottom of the stairs. If you do not have them, ask someone to add them for you. What else can I do to help prevent falls? Wear shoes that: Do not have high heels. Have rubber bottoms. Are comfortable and fit you well. Are closed at the toe. Do not wear sandals. If you use a stepladder: Make sure that it is fully opened. Do not climb a closed stepladder. Make sure that both sides of the stepladder are locked into place. Ask someone to hold it for you, if  possible. Clearly mark and make sure that you can see: Any grab bars or handrails. First and last steps. Where the edge of each step is. Use tools that help you move around (mobility aids) if they are needed. These include: Canes. Walkers. Scooters. Crutches. Turn on the lights when you go into a dark area. Replace any light bulbs as soon as they burn out. Set up your furniture so you have a clear path. Avoid moving your furniture around. If any of your floors are uneven, fix them. If there are any pets around you, be aware of where they are. Review your medicines with your doctor. Some medicines can make you feel dizzy. This can increase your chance of falling. Ask your doctor what other things that you can do to help prevent falls. This information is not intended to replace advice given to you by your health care provider. Make sure you discuss any questions you have with your health care provider. Document Released: 10/03/2009 Document Revised: 05/14/2016 Document Reviewed: 01/11/2015 Elsevier Interactive Patient Education  2017 [employer].

## 2022-01-08 ENCOUNTER — Telehealth: Payer: Self-pay

## 2022-01-08 NOTE — Chronic Care Management (AMB) (Signed)
° °  Called patient for appointment reminder with Orlando Penner CPP on 01-14-2022. Patient wanted to reschedule to 02-06-2022.  Care Gaps: Yearly foot exam AWV 01-13-2023  Star Rating Drug: None  Any gaps in medications fill history?No  [place] Pharmacist Assistant [phone]

## 2022-01-12 ENCOUNTER — Other Ambulatory Visit: Payer: Medicare HMO

## 2022-01-12 ENCOUNTER — Other Ambulatory Visit: Payer: Self-pay

## 2022-01-12 ENCOUNTER — Ambulatory Visit
Admission: RE | Admit: 2022-01-12 | Discharge: 2022-01-12 | Disposition: A | Discharge status: Home / Self Care | Payer: Medicare HMO | Source: Ambulatory Visit | Attending: Nurse Practitioner | Admitting: Nurse Practitioner

## 2022-01-12 DIAGNOSIS — E782 Mixed hyperlipidemia: Secondary | ICD-10-CM | POA: Diagnosis not present

## 2022-01-12 DIAGNOSIS — M546 Pain in thoracic spine: Secondary | ICD-10-CM

## 2022-01-12 DIAGNOSIS — Z8701 Personal history of pneumonia (recurrent): Secondary | ICD-10-CM | POA: Diagnosis not present

## 2022-01-13 LAB — LIPID PANEL
Chol/HDL Ratio: 2.5 ratio (ref 0.0–4.4)
Cholesterol, Total: 165 mg/dL (ref 100–199)
HDL: 67 mg/dL (ref >39)
LDL Chol Calc (NIH): 83 mg/dL (ref 0–99)
Triglycerides: 80 mg/dL (ref 0–149)
VLDL Cholesterol Cal: 15 mg/dL (ref 5–40)

## 2022-01-14 ENCOUNTER — Telehealth: Payer: Medicare HMO

## 2022-01-30 ENCOUNTER — Telehealth: Payer: Self-pay

## 2022-01-30 NOTE — Chronic Care Management (AMB) (Signed)
°  [name] was reminded to have all medications, supplements and any blood glucose and blood pressure readings available for review with Orlando Penner, Pharm. D, at her telephone visit on 01-30-2022 at 10:00.   Questions: Have you had any recent office visit or specialist visit outside of [facility]? Patient stated no. Patient stated she now has a blood pressure monitor.  Are there any concerns you would like to discuss during your office visit? Patient stated no  Are you having any problems obtaining your medications? (Whether it pharmacy issues or cost) Patient stated no  If patient has any PAP medications ask if they are having any problems getting their PAP medication or refill? No PAP mediations.  Care Gaps: Yearly foot exam AWV 01-13-2023  Star Rating Drug: None  Any gaps in medications fill history? No  [place] Pharmacist Assistant [phone]

## 2022-02-06 ENCOUNTER — Ambulatory Visit (INDEPENDENT_AMBULATORY_CARE_PROVIDER_SITE_OTHER): Payer: Medicare HMO

## 2022-02-06 DIAGNOSIS — I10 Essential (primary) hypertension: Secondary | ICD-10-CM

## 2022-02-06 DIAGNOSIS — E782 Mixed hyperlipidemia: Secondary | ICD-10-CM

## 2022-02-06 NOTE — Progress Notes (Signed)
Chronic Care Management Pharmacy Note  02/17/2022 Name:  Nancy [name] MRN:  [id number] DOB:  01/15/1951  Summary: Patient reports that she has been eating a plant based diet.   Recommendations/Changes made from today's visit: Recommend patient have referral for nutritionist.   Plan: Patient reports that she is open to having a referral for a nutritionist.    Subjective: Nancy [name] is an 71 y.o. year old female who is a primary patient of [name], [place].  The CCM team was consulted for assistance with disease management and care coordination needs.    Engaged with patient by telephone for follow up visit in response to provider referral for pharmacy case management and/or care coordination services.   Consent to Services:  The patient was given information about Chronic Care Management services, agreed to services, and gave verbal consent prior to initiation of services.  Please see initial visit note for detailed documentation.   Patient Care Team: [name], FNP as PCP - General (General Practice) Jerline Pain, MD as PCP - Cardiology (Cardiology) Mayford Knife, [facility] (Pharmacist) Starlyn Skeans as Physician Assistant (Dermatology)  Recent office visits: 11/19/2021 PCP OV  Recent consult visits: 12/08/2021 Ophthalmology OV 12/02/2021 Plastic surgeon [facility] visits: Yes in previous 6 months    Objective:  Lab Results  Component Value Date   CREATININE 1.15 (H) 11/19/2021   BUN 22 11/19/2021   EGFR 51 (L) 11/19/2021   GFRNONAA 55 (L) 10/16/2021   GFRAA 59 (L) 02/12/2021   NA 140 11/19/2021   K 5.1 11/19/2021   CALCIUM 9.7 11/19/2021   CO2 25 11/19/2021   GLUCOSE 87 11/19/2021    Lab Results  Component Value Date/Time   HGBA1C 5.8 (H) 09/18/2021 03:12 PM   HGBA1C 5.6 02/12/2021 02:12 PM   MICROALBUR 10 02/12/2021 12:47 PM   MICROALBUR 10 09/25/2019 04:40 PM    Last diabetic Eye exam:  Lab Results  Component Value Date/Time    HMDIABEYEEXA No Retinopathy 12/08/2021 12:00 AM    Last diabetic Foot exam: No results found for: HMDIABFOOTEX   Lab Results  Component Value Date   CHOL 165 01/12/2022   HDL 67 01/12/2022   LDLCALC 83 01/12/2022   TRIG 80 01/12/2022   CHOLHDL 2.5 01/12/2022    Hepatic Function Latest Ref Rng & Units 09/18/2021 02/12/2021 07/09/2020  Total Protein 6.0 - 8.5 g/dL 7.6 7.3 7.2  Albumin 3.8 - 4.8 g/dL 4.7 4.6 4.5  AST 0 - 40 IU/L 33 23 24  ALT 0 - 32 IU/L 26 14 17   Alk Phosphatase 44 - 121 IU/L 84 69 63  Total Bilirubin 0.0 - 1.2 mg/dL 0.5 0.6 0.7    Lab Results  Component Value Date/Time   TSH 0.648 09/18/2021 03:12 PM   TSH 2.500 04/10/2021 10:02 AM   FREET4 1.40 02/12/2021 02:12 PM    CBC Latest Ref Rng & Units 11/19/2021 10/16/2021 09/18/2021  WBC 3.4 - 10.8 x10E3/uL 9.0 5.3 6.6  Hemoglobin 11.1 - 15.9 g/dL 12.2 14.6 14.1  Hematocrit 34.0 - 46.6 % 38.2 46.2(H) 43.0  Platelets 150 - 450 x10E3/uL 275 211 249    No results found for: VD25OH  Clinical ASCVD: No  The ASCVD Risk score (Arnett DK, et al., 2019) failed to calculate for the following reasons:   Unable to determine if patient is Non-Hispanic African American    Depression screen [facility] 2/9 12/31/2021 01/16/2021 04/10/2020  Decreased Interest 0 0 0  Down, Depressed, Hopeless  0 0 0  PHQ - 2 Score 0 0 0  Altered sleeping - - -  Tired, decreased energy - - -  Change in appetite - - -  Feeling bad or failure about yourself  - - -  Trouble concentrating - - -  Moving slowly or fidgety/restless - - -  Suicidal thoughts - - -  PHQ-9 Score - - -  Difficult doing work/chores - - -    Social History   Tobacco Use  Smoking Status Former   Types: Cigarettes   Quit date: 2010   Years since quitting: 13.1  Smokeless Tobacco Never   BP Readings from Last 3 Encounters:  11/19/21 112/74  10/21/21 (!) 93/48  10/16/21 (!) 115/57   Pulse Readings from Last 3 Encounters:  11/19/21 71  10/21/21 65  10/16/21 (!) 55    Wt Readings from Last 3 Encounters:  12/31/21 217 lb (98.4 kg)  11/19/21 221 lb 6.4 oz (100.4 kg)  10/20/21 225 lb (102.1 kg)   BMI Readings from Last 3 Encounters:  12/31/21 36.11 kg/m  11/19/21 36.84 kg/m  10/20/21 37.44 kg/m    Assessment/Interventions: Review of patient past medical history, allergies, medications, health status, including review of consultants reports, laboratory and other test data, was performed as part of comprehensive evaluation and provision of chronic care management services.   SDOH:  (Social Determinants of Health) assessments and interventions performed: Yes SDOH Interventions    Flowsheet Row Most Recent Value  SDOH Interventions   Food Insecurity Interventions Intervention Not Indicated      SDOH Screenings   Alcohol Screen: Not on file  Depression (PHQ2-9): Low Risk    PHQ-2 Score: 0  Financial Resource Strain: Low Risk    Difficulty of Paying Living Expenses: Not hard at all  Food Insecurity: No Food Insecurity   Worried About Charity fundraiser in the Last Year: Never true   Ran Out of Food in the Last Year: Never true  Housing: Not on file  Physical Activity: Sufficiently Active   Days of Exercise per Week: 7 days   Minutes of Exercise per Session: 90 min  Social Connections: Not on file  Stress: No Stress Concern Present   Feeling of Stress : Only a little  Tobacco Use: Medium Risk   Smoking Tobacco Use: Former   Smokeless Tobacco Use: Never   Passive Exposure: Not on file  Transportation Needs: No Transportation Needs   Lack of Transportation (Medical): No   Lack of Transportation (Non-Medical): No    CCM Care Plan  No Known Allergies  Medications Reviewed Today     Reviewed by Kellie Simmering, LPN (Licensed Practical Nurse) on 12/31/21 at 79  Med List Status: <None>   Medication Order Taking? Sig Documenting Provider Last Dose Status Informant  Accu-Chek Softclix Lancets lancets [id number] Yes Use to check  blood sugars daily E11.69 [name], FNP Taking Active Self  amoxicillin (AMOXIL) 875 MG tablet [id number]  Take 1 tablet (875 mg total) by mouth 2 (two) times daily. [name], FNP  Active   atorvastatin (LIPITOR) 20 MG tablet [id number] Yes Take 1 tablet (20 mg total) by mouth daily. [name], FNP Taking Active   azelastine (OPTIVAR) 0.05 % ophthalmic solution [id number] Yes INSTILL ONE DROP IN [facility] EYE TWICE DAILY Strength: 0.05 % [name], FNP Taking Active   blood glucose meter kit and supplies [id number] Yes Dispense based on patient and insurance preference. Use up to four times daily as  directed. (FOR ICD-10 E10.9, E11.9). [name], FNP Taking Active Self  Blood Glucose Monitoring Suppl (ACCU-CHEK GUIDE) w/Device KIT [id number] Yes 1 Device by Does not apply route daily. [name], FNP Taking Active Self  Blood Pressure Monitor KIT [id number] No USE TO CHECK BLOOD PRESSURE 110  Patient not taking: Reported on 12/31/2021   [name], FNP Not Taking Active Self  Cholecalciferol 125 MCG (5000 UT) TABS [id number] Yes Take 5,000 Units by mouth daily. [provider] Taking Active Self  clobetasol (TEMOVATE) 0.05 % external solution [id number] Yes Apply to scalp nightly Sheffield, Kelli R, PA-C Taking Active Self  clobetasol ointment (TEMOVATE) 0.05 % [id number] Yes Apply to elbows knees and back behind ears nightly Sheffield, Kelli R, PA-C Taking Active Self  desoximetasone (TOPICORT) 0.25 % cream [id number] Yes Apply to skin daily safe to use on face and skin folds Warren Danes, PA-C Taking Active Self  GLUCOSAMINE CHONDROITIN COMPLX PO [id number] Yes Take 1 capsule by mouth daily. 1500 mg/1200 mg [provider] Taking Active Self  Lactobacillus (PROBIOTIC ACIDOPHILUS PO) [id number] Yes Take 0.5 mg by mouth daily. [provider] Taking Active Self  levothyroxine (SYNTHROID) 150 MCG tablet [id number] Yes Take 1 tablet (150 mcg total) by mouth  daily before breakfast. [name], FNP Taking Active   Magnesium 250 MG TABS [id number] Yes Take 250 mg by mouth daily. [provider] Taking Active Self  Multiple Vitamins-Minerals (MULTIVITAMIN WOMEN 50+) TABS [id number] Yes Take 1 tablet by mouth daily. [provider] Taking Active Self  Donald Siva test strip [id number] Yes USE AS INSTRUCTED [name], FNP Taking Active Self  Scar Treatment Products (STRATA TRIZ) GEL [id number] Yes Apply 0.5 inches topically daily. Corena Herter, PA-C Taking Active   spironolactone (ALDACTONE) 50 MG tablet [id number] Yes Take 1 tablet (50 mg total) by mouth daily. [name], FNP Taking Active   tiZANidine (ZANAFLEX) 2 MG tablet [id number] No Take 1 tablet (2 mg total) by mouth every 8 (eight) hours as needed for muscle spasms.  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   Bifascicular block 02/08/2020   Family history of early CAD 02/08/2020   Prediabetes 09/25/2019   Dry eyes 09/25/2019   Mixed hyperlipidemia 09/25/2019   Essential hypertension 09/25/2019    Immunization History  Administered Date(s) Administered   Influenza, High Dose Seasonal PF 08/13/2019   Influenza,inj,Quad PF,6+ Mos 08/13/2019   Influenza-Unspecified 09/11/2021   PFIZER Comirnaty(Gray Top)Covid-19 Tri-Sucrose Vaccine 06/12/2021   PFIZER(Purple Top)SARS-COV-2 Vaccination 02/16/2020, 03/13/2020, 10/03/2020, 06/12/2021   PNEUMOCOCCAL CONJUGATE-20 12/26/2021   Pfizer Covid-19 Vaccine Bivalent Booster 3yr & up 10/12/2021   Tdap 01/11/2020   Zoster Recombinat (Shingrix) 08/29/2020, 11/06/2020    Conditions to be addressed/monitored:  Hypertension and Hyperlipidemia  Care Plan : [place] Updates made by PMayford Knife RPH since 02/17/2022 12:00 AM     Problem: HTN, HLD   Priority: High     Long-Range Goal: Disease Management   Recent Progress: On track  Priority: High  Note:   Current Barriers:  Unable to achieve control of cholesterol   Pharmacist Clinical Goal(s):  Patient will achieve adherence to monitoring guidelines and medication adherence to achieve therapeutic efficacy through  collaboration with PharmD and provider.   Interventions: 1:1 collaboration with [name], FNP regarding development and update of comprehensive plan of care as evidenced by provider attestation and co-signature Inter-disciplinary care team collaboration (see longitudinal plan of care) Comprehensive medication review performed; medication list updated in electronic medical record  Hypertension (BP goal <130/80) -Controlled -Current treatment: Spironolactone 50 mg tablet once per day Appropriate, Effective, Safe, Accessible -Current home readings: Patient reports that she has not been checking her BP at home at this time -Current dietary habits: She is not eating  -Current exercise habits: please see hyperlipidemia for more details  -Denies hypotensive/hypertensive symptoms -Educated on Daily salt intake goal < 2300 mg; Exercise goal of 150 minutes per week; -Patient reports that she is eating more Tofu -  -She has since started working on the treadmill every day for 4 miles. She was encouraged to go back to exercising after surgery  -Nancy [name] has a goal to reduce the amount of Diet [facility] to once a week -Counseled to monitor BP at home at least once per week, document, and provide log at future appointments -Recommended to continue current medication  Hyperlipidemia: (LDL goal < 70) -Uncontrolled -Current treatment: Atorvastatin 20 mg tablet once per day Appropriate, Effective, Safe, Accessible -Current dietary patterns: patient reports that she is eating more carbohydrates  -Current  exercise habits: she is walking on the treadmill every day for 4 miles  -Educated on Benefits of statin for ASCVD risk reduction; Importance of limiting foods high in cholesterol; -Recommended to continue current medication Collaborated with patient and discussed opportunity to increase atorvastatin to 40 mg tablet daily, at this time she would like to focus more on a balanced diet    Patient Goals/Self-Care Activities Patient will:  - take medications as prescribed as evidenced by patient report and record review  Follow Up Plan: The patient has been provided with contact information for the care management team and has been advised to call with any health related questions or concerns.       Medication Assistance: None required.  Patient affirms current coverage meets needs.  Compliance/Adherence/Medication fill history: Care Gaps: Foot Exam Urine Microalbumin  Star-Rating Drugs: Atorvastatin 20 mg tablet   Patient's preferred pharmacy is:  [id number]Phone: [phone]: [address] [address]Phone: [phone]: [phone] Uses pill box? Yes Pt endorses 90% compliance  We discussed: Benefits of medication synchronization, packaging and delivery as well as enhanced pharmacist oversight with [employer]. Patient decided to: Continue current medication management strategy  Care Plan and Follow Up Patient Decision:  Patient agrees to Care Plan and Follow-up.  Plan: The patient has been provided with contact information for the care management team and has been advised to call with any health related questions or concerns.   [name] CPP, PharmD Clinical Pharmacist Practitioner [facility] [phone]

## 2022-02-09 DIAGNOSIS — H25813 Combined forms of age-related cataract, bilateral: Secondary | ICD-10-CM | POA: Diagnosis not present

## 2022-02-17 DIAGNOSIS — E782 Mixed hyperlipidemia: Secondary | ICD-10-CM

## 2022-02-17 DIAGNOSIS — I10 Essential (primary) hypertension: Secondary | ICD-10-CM | POA: Diagnosis not present

## 2022-02-17 NOTE — Patient Instructions (Signed)
Visit Information It was great speaking with you today!  Please let me know if you have any questions about our visit.   Goals Addressed             This Visit's Progress    Track and Manage My Blood Pressure-Hypertension       Timeframe:  Long-Range Goal Priority:  High Start Date:                             Expected End Date:                       Follow Up Date 08/07/2022 In Progress:  - check blood pressure 3 times per week - choose a place to take my blood pressure (home, clinic or office, retail store) - write blood pressure results in a log or diary    Why is this important?   You won't feel high blood pressure, but it can still hurt your blood vessels.  High blood pressure can cause heart or kidney problems. It can also cause a stroke.  Making lifestyle changes like losing a little weight or eating less salt will help.  Checking your blood pressure at home and at different times of the day can help to control blood pressure.  If the doctor prescribes medicine remember to take it the way the doctor ordered.  Call the office if you cannot afford the medicine or if there are questions about it.             Patient Care Plan: CCM Pharmacy Care Plan     Problem Identified: HTN, HLD   Priority: High     Long-Range Goal: Disease Management   Recent Progress: On track  Priority: High  Note:   Current Barriers:  Unable to achieve control of cholesterol   Pharmacist Clinical Goal(s):  Patient will achieve adherence to monitoring guidelines and medication adherence to achieve therapeutic efficacy through collaboration with PharmD and provider.   Interventions: 1:1 collaboration with [name], FNP regarding development and update of comprehensive plan of care as evidenced by provider attestation and co-signature Inter-disciplinary care team collaboration (see longitudinal plan of care) Comprehensive medication review performed; medication list updated in  electronic medical record  Hypertension (BP goal <130/80) -Controlled -Current treatment: Spironolactone 50 mg tablet once per day Appropriate, Effective, Safe, Accessible -Current home readings: Patient reports that she has not been checking her BP at home at this time -Current dietary habits: She is not eating  -Current exercise habits: please see hyperlipidemia for more details  -Denies hypotensive/hypertensive symptoms -Educated on Daily salt intake goal < 2300 mg; Exercise goal of 150 minutes per week; -Patient reports that she is eating more Tofu -  -She has since started working on the treadmill every day for 4 miles. She was encouraged to go back to exercising after surgery  -Ms. Toruno has a goal to reduce the amount of Diet [facility] to once a week -Counseled to monitor BP at home at least once per week, document, and provide log at future appointments -Recommended to continue current medication  Hyperlipidemia: (LDL goal < 70) -Uncontrolled -Current treatment: Atorvastatin 20 mg tablet once per day Appropriate, Effective, Safe, Accessible -Current dietary patterns: patient reports that she is eating more carbohydrates  -Current exercise habits: she is walking on the treadmill every day for 4 miles  -Educated on Benefits of statin for ASCVD risk  reduction; Importance of limiting foods high in cholesterol; -Recommended to continue current medication Collaborated with patient and discussed opportunity to increase atorvastatin to 40 mg tablet daily, at this time she would like to focus more    Patient Goals/Self-Care Activities Patient will:  - take medications as prescribed as evidenced by patient report and record review  Follow Up Plan: The patient has been provided with contact information for the care management team and has been advised to call with any health related questions or concerns.       Patient agreed to services and verbal consent obtained.   The  patient verbalized understanding of instructions, educational materials, and care plan provided today and agreed to receive a mailed copy of patient instructions, educational materials, and care plan.   Orlando Penner, PharmD Clinical Pharmacist [facility] [phone]

## 2022-02-18 ENCOUNTER — Other Ambulatory Visit: Payer: Self-pay

## 2022-02-18 ENCOUNTER — Encounter: Payer: Self-pay | Admitting: Nurse Practitioner

## 2022-02-18 ENCOUNTER — Ambulatory Visit (INDEPENDENT_AMBULATORY_CARE_PROVIDER_SITE_OTHER): Payer: Medicare HMO | Admitting: Nurse Practitioner

## 2022-02-18 VITALS — BP 132/74 | HR 66 | Temp 98.6°F | Ht 65.0 in | Wt 220.8 lb

## 2022-02-18 DIAGNOSIS — R7303 Prediabetes: Secondary | ICD-10-CM | POA: Diagnosis not present

## 2022-02-18 DIAGNOSIS — Z79899 Other long term (current) drug therapy: Secondary | ICD-10-CM | POA: Diagnosis not present

## 2022-02-18 DIAGNOSIS — Z6836 Body mass index (BMI) 36.0-36.9, adult: Secondary | ICD-10-CM | POA: Diagnosis not present

## 2022-02-18 DIAGNOSIS — E782 Mixed hyperlipidemia: Secondary | ICD-10-CM

## 2022-02-18 DIAGNOSIS — I10 Essential (primary) hypertension: Secondary | ICD-10-CM

## 2022-02-18 DIAGNOSIS — Z0000 Encounter for general adult medical examination without abnormal findings: Secondary | ICD-10-CM | POA: Diagnosis not present

## 2022-02-18 LAB — POCT URINALYSIS DIPSTICK
Bilirubin, UA: NEGATIVE
Blood, UA: NEGATIVE
Glucose, UA: NEGATIVE
Leukocytes, UA: NEGATIVE
Nitrite, UA: NEGATIVE
Protein, UA: NEGATIVE
Spec Grav, UA: 1.02 (ref 1.010–1.025)
Urobilinogen, UA: 0.2 E.U./dL
pH, UA: 5 (ref 5.0–8.0)

## 2022-02-18 NOTE — Progress Notes (Signed)
I,Tianna Badgett,acting as a Education administrator for [employer], FNP.,have documented all relevant documentation on the behalf of [name], FNP,as directed by  [name], FNP while in the presence of [name], [place].  This visit occurred during the SARS-CoV-2 public health emergency.  Safety protocols were in place, including screening questions prior to the visit, additional usage of staff PPE, and extensive cleaning of exam room while observing appropriate contact time as indicated for disinfecting solutions.  Subjective:     Patient ID: Nancy [name] , female    DOB: 10/30/1951 , 71 y.o.   MRN: [id number]   Chief Complaint  Patient presents with   Annual Exam    HPI  Pt is here today for her Health Maintenace exam. She states that she is compliant with medications. She has no concerns at this time. Patient declines EKG at this appointment would like to get at her Cardiology follow up appointment.   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   Complication of anesthesia    pt states she woke up during a tonsillectomy when she was 28   Hyperlipidemia    Hypertension    Hypothyroidism 1980   Prediabetes    Thyroid disease      Family History  Problem Relation Age of Onset   Diabetes Mother    Hypertension Mother    Hypertension Father    Heart disease Father    Diabetes Sister    Kidney failure Sister    Diabetes Brother    Kidney disease Brother    Diabetes Sister    Diabetes Sister    Asthma Sister    Cancer Maternal Grandfather    Cancer Paternal Grandfather      Current Outpatient Medications:    Accu-Chek Softclix Lancets lancets, Use to check blood sugars daily E11.69, Disp: 100 each, Rfl: 2   atorvastatin (LIPITOR) 20 MG tablet, Take 1  tablet (20 mg total) by mouth daily., Disp: 90 tablet, Rfl: 1   azelastine (OPTIVAR) 0.05 % ophthalmic solution, INSTILL ONE DROP IN [facility] TWICE DAILY Strength: 0.05 %, Disp: 6 mL, Rfl: 3   blood glucose meter kit and supplies, Dispense based on patient and insurance preference. Use up to four times daily as directed. (FOR ICD-10 E10.9, E11.9)., Disp: 1 each, Rfl: 3   Blood Glucose Monitoring Suppl (ACCU-CHEK GUIDE) w/Device KIT, 1 Device by Does not apply route daily., Disp: 1 kit, Rfl: 0   Blood Pressure Monitor KIT, USE TO CHECK BLOOD PRESSURE 110, Disp: 1 kit, Rfl: 2   Cholecalciferol 125 MCG (5000 UT) TABS, Take 5,000 Units by mouth daily., Disp: , Rfl:    clobetasol (TEMOVATE) 0.05 % external solution, Apply to scalp nightly, Disp: 50 mL, Rfl: 6   clobetasol ointment (TEMOVATE) 0.05 %, Apply to elbows knees and back behind ears nightly, Disp: 60 g, Rfl: 4   desoximetasone (TOPICORT) 0.25 % cream, Apply to skin daily safe to use on face and skin folds, Disp: 100 g, Rfl: 4   GLUCOSAMINE CHONDROITIN COMPLX PO, Take 1 capsule by mouth daily. 1500 mg/1200 mg, Disp: , Rfl:    Lactobacillus (PROBIOTIC ACIDOPHILUS PO), Take 0.5 mg by mouth daily., Disp: , Rfl:  levothyroxine (SYNTHROID) 150 MCG tablet, Take 1 tablet (150 mcg total) by mouth daily before breakfast., Disp: 115 tablet, Rfl: 1   Magnesium 250 MG TABS, Take 250 mg by mouth daily., Disp: , Rfl:    Multiple Vitamins-Minerals (MULTIVITAMIN WOMEN 50+) TABS, Take 1 tablet by mouth daily., Disp: , Rfl:    ONETOUCH ULTRA test strip, USE AS INSTRUCTED, Disp: 100 strip, Rfl: 12   Scar Treatment Products (STRATA TRIZ) GEL, Apply 0.5 inches topically daily., Disp: 20 g, Rfl: 0   spironolactone (ALDACTONE) 50 MG tablet, Take 1 tablet (50 mg total) by mouth daily., Disp: 90 tablet, Rfl: 1   No Known Allergies    The patient states she is  post menopausal status.  No LMP recorded. Patient is postmenopausal.. Negative for Dysmenorrhea and  Negative for Menorrhagia. Negative for: breast discharge, breast lump(s), breast pain and breast self exam. Associated symptoms include abnormal vaginal bleeding. Pertinent negatives include abnormal bleeding (hematology), anxiety, decreased libido, depression, difficulty falling sleep, dyspareunia, history of infertility, nocturia, sexual dysfunction, sleep disturbances, urinary incontinence, urinary urgency, vaginal discharge and vaginal itching. Diet regular - she had started eating meat but has cut back and cutting back on Tofu. The patient states her exercise level is moderate with walking 4 miles a day.    The patient's tobacco use is:  Social History   Tobacco Use  Smoking Status Former   Types: Cigarettes   Quit date: 2010   Years since quitting: 13.1  Smokeless Tobacco Never  . She has been exposed to passive smoke. The patient's alcohol use is:  Social History   Substance and Sexual Activity  Alcohol Use Not Currently    Review of Systems  Constitutional: Negative.   HENT: Negative.    Eyes: Negative.   Respiratory: Negative.    Cardiovascular: Negative.   Gastrointestinal: Negative.   Endocrine: Negative.   Genitourinary: Negative.   Musculoskeletal: Negative.   Skin: Negative.   Allergic/Immunologic: Negative.   Neurological: Negative.   Hematological: Negative.   Psychiatric/Behavioral: Negative.      Today's Vitals   02/18/22 1120  BP: 132/74  Pulse: 66  Temp: 98.6 F (37 C)  TempSrc: Oral  Weight: 220 lb 12.8 oz (100.2 kg)  Height: _0  (1.651 m)   Body mass index is 36.74 kg/m.  Wt Readings from Last 3 Encounters:  02/18/22 220 lb 12.8 oz (100.2 kg)  12/31/21 217 lb (98.4 kg)  11/19/21 221 lb 6.4 oz (100.4 kg)    Objective:  Physical Exam Constitutional:      General: She is not in acute distress.    Appearance: Normal appearance. She is well-developed. She is obese.  HENT:     Head: Normocephalic and atraumatic.     Right Ear: Hearing,  tympanic membrane, ear canal and external ear normal. There is no impacted cerumen.     Left Ear: Hearing, tympanic membrane, ear canal and external ear normal. There is no impacted cerumen.     Nose:     Comments: Deferred - masked    Mouth/Throat:     Comments: Deferred - masked Eyes:     General: Lids are normal.     Extraocular Movements: Extraocular movements intact.     Conjunctiva/sclera: Conjunctivae normal.     Pupils: Pupils are equal, round, and reactive to light.     Funduscopic exam:    Right eye: No papilledema.        Left eye: No papilledema.  Neck:  Thyroid: No thyroid mass.     Vascular: No carotid bruit.  Cardiovascular:     Rate and Rhythm: Normal rate and regular rhythm.     Pulses: Normal pulses.     Heart sounds: Normal heart sounds. No murmur heard. Pulmonary:     Effort: Pulmonary effort is normal. No respiratory distress.     Breath sounds: Normal breath sounds. No wheezing.  Chest:     Chest wall: No mass.  Breasts:    Tanner Score is 5.     Right: Normal. No mass or tenderness.     Left: Normal. No mass or tenderness.  Abdominal:     General: Abdomen is flat. Bowel sounds are normal. There is no distension.     Palpations: Abdomen is soft.     Tenderness: There is no abdominal tenderness.     Comments: Left abdomen has firm area, has recently had surgery and will follow up with surgeon  Genitourinary:    Rectum: Guaiac result negative.  Musculoskeletal:        General: No swelling. Normal range of motion.     Cervical back: Full passive range of motion without pain, normal range of motion and neck supple.     Right lower leg: No edema.     Left lower leg: No edema.  Lymphadenopathy:     Upper Body:     Right upper body: No supraclavicular, axillary or pectoral adenopathy.     Left upper body: No supraclavicular, axillary or pectoral adenopathy.  Skin:    General: Skin is warm and dry.     Capillary Refill: Capillary refill takes less than  2 seconds.     Comments: Healing surgical scars to panus  Neurological:     General: No focal deficit present.     Mental Status: She is alert and oriented to person, place, and time.     Cranial Nerves: No cranial nerve deficit.     Sensory: No sensory deficit.  Psychiatric:        Mood and Affect: Mood normal.        Behavior: Behavior normal.        Thought Content: Thought content normal.        Judgment: Judgment normal.        Assessment And Plan:     1. Routine general medical examination at health care facility Behavior modifications discussed and diet history reviewed.   Pt will continue to exercise regularly and modify diet with low GI, plant based foods and decrease intake of processed foods.  Recommend intake of daily multivitamin, Vitamin D, and calcium.  Recommend mammogram and colonoscopy (up to date) for preventive screenings, as well as recommend immunizations that include influenza, TDAP  2. Essential hypertension Comments: Blood pressure is well controlled, continue current medications - CMP14+EGFR - POCT Urinalysis Dipstick (81002) - Microalbumin / Creatinine Urine Ratio - EKG 12-Lead  3. Mixed hyperlipidemia Comments: Stable, tolerating statin well continue current medications - Lipid panel  4. Prediabetes Comments: Stable, diet controlled - Hemoglobin A1c - POCT Urinalysis Dipstick (81002) - Microalbumin / Creatinine Urine Ratio  5. Class 2 severe obesity due to excess calories with serious comorbidity and body mass index (BMI) of 36.0 to 36.9 in adult [facility]) She is encouraged to strive for BMI less than 30 to decrease cardiac risk. Advised to aim for at least 150 minutes of exercise per week.  6. Other long term (current) drug therapy - CBC   Patient was  given opportunity to ask questions. Patient verbalized understanding of the plan and was able to repeat key elements of the plan. All questions were answered to their satisfaction.   [name], FNP   I, [name], FNP, have reviewed all documentation for this visit. The documentation on 02/18/22 for the exam, diagnosis, procedures, and orders are all accurate and complete.   THE PATIENT IS ENCOURAGED TO PRACTICE SOCIAL DISTANCING DUE TO THE COVID-19 PANDEMIC.

## 2022-02-19 LAB — LIPID PANEL
Chol/HDL Ratio: 2.9 ratio (ref 0.0–4.4)
Cholesterol, Total: 164 mg/dL (ref 100–199)
HDL: 57 mg/dL (ref >39)
LDL Chol Calc (NIH): 94 mg/dL (ref 0–99)
Triglycerides: 68 mg/dL (ref 0–149)
VLDL Cholesterol Cal: 13 mg/dL (ref 5–40)

## 2022-02-19 LAB — CMP14+EGFR
ALT: 26 IU/L (ref 0–32)
AST: 36 IU/L (ref 0–40)
Albumin/Globulin Ratio: 1.3 (ref 1.2–2.2)
Albumin: 4.3 g/dL (ref 3.8–4.8)
Alkaline Phosphatase: 122 IU/L — ABNORMAL HIGH (ref 44–121)
BUN/Creatinine Ratio: 23 (ref 12–28)
BUN: 21 mg/dL (ref 8–27)
Bilirubin Total: 0.5 mg/dL (ref 0.0–1.2)
CO2: 23 mmol/L (ref 20–29)
Calcium: 9.7 mg/dL (ref 8.7–10.3)
Chloride: 104 mmol/L (ref 96–106)
Creatinine, Ser: 0.91 mg/dL (ref 0.57–1.00)
Globulin, Total: 3.4 g/dL (ref 1.5–4.5)
Glucose: 76 mg/dL (ref 70–99)
Potassium: 5 mmol/L (ref 3.5–5.2)
Sodium: 141 mmol/L (ref 134–144)
Total Protein: 7.7 g/dL (ref 6.0–8.5)
eGFR: 68 mL/min/{1.73_m2} (ref >59)

## 2022-02-19 LAB — CBC
Hematocrit: 39.3 % (ref 34.0–46.6)
Hemoglobin: 12.7 g/dL (ref 11.1–15.9)
MCH: 28.3 pg (ref 26.6–33.0)
MCHC: 32.3 g/dL (ref 31.5–35.7)
MCV: 88 fL (ref 79–97)
Platelets: 326 10*3/uL (ref 150–450)
RBC: 4.48 x10E6/uL (ref 3.77–5.28)
RDW: 14.3 % (ref 11.7–15.4)
WBC: 7.4 10*3/uL (ref 3.4–10.8)

## 2022-02-19 LAB — MICROALBUMIN / CREATININE URINE RATIO
Creatinine, Urine: 77.3 mg/dL
Microalb/Creat Ratio: 20 mg/g creat (ref 0–29)
Microalbumin, Urine: 15.7 ug/mL

## 2022-02-19 LAB — HEMOGLOBIN A1C
Est. average glucose Bld gHb Est-mCnc: 114 mg/dL
Hgb A1c MFr Bld: 5.6 % (ref 4.8–5.6)

## 2022-03-17 ENCOUNTER — Ambulatory Visit: Payer: Medicare HMO | Admitting: Plastic Surgery

## 2022-03-17 ENCOUNTER — Encounter: Payer: Self-pay | Admitting: Plastic Surgery

## 2022-03-17 ENCOUNTER — Other Ambulatory Visit: Payer: Self-pay

## 2022-03-17 DIAGNOSIS — M793 Panniculitis, unspecified: Secondary | ICD-10-CM | POA: Diagnosis not present

## 2022-03-17 NOTE — Progress Notes (Signed)
? ?  Subjective:  ? ? Patient ID: Nancy [name], female    DOB: 06/16/1951, 71 y.o.   MRN: [id number] ? ?The patient is a 71 year old female here for follow-up after undergoing a panniculectomy 6 months ago.  Over the past 2 months she has noticed some fullness in her abdomen.  It has not been painful but it has been getting firm.  On exam it look like a fluid wave.  We had a little bit of a challenge getting the fluid out but we were able to get out around 80 cc of dark bloody drainage.  Nothing looks infected.  It did soften up a lot and improved.  We talked about the options of going ahead and getting an ultrasound versus getting an ultrasound and having interventional radiology put a drain in.  The incisions are all healing very nicely. ? ? ? ? ?Review of Systems  ?Constitutional: Negative.   ?Eyes: Negative.   ?Respiratory: Negative.    ?Cardiovascular: Negative.   ?Gastrointestinal: Negative.   ?Endocrine: Negative.   ?Genitourinary: Negative.   ? ?   ?Objective:  ? Physical Exam ?Constitutional:   ?   Appearance: Normal appearance.  ?Cardiovascular:  ?   Rate and Rhythm: Normal rate.  ?   Pulses: Normal pulses.  ?Pulmonary:  ?   Effort: Pulmonary effort is normal.  ?Abdominal:  ?   General: There is distension.  ?   Palpations: There is no mass.  ?   Tenderness: There is no guarding.  ?   Hernia: No hernia is present.  ?Neurological:  ?   Mental Status: She is alert.  ? ? ? ?   ?Assessment & Plan:  ? ?  ICD-10-CM   ?1. Panniculitis  M79.3   ?  ?  ?The patient would like to wait a week or 2 and see how she does.  She knows to give [place] a call if it starts feeling up.  And if it does she can call and we will arrange for an ultrasound. ?

## 2022-03-23 ENCOUNTER — Telehealth: Payer: Self-pay

## 2022-03-23 NOTE — Chronic Care Management (AMB) (Signed)
? ? ?Chronic Care Management ?Pharmacy Assistant  ? ?Name: Nancy [name]  MRN: [id number] DOB: 1951/10/02 ? ?Reason for Encounter: Medication Review/ Medication coordination ? ?Recent office visits:  ?02-18-2022 Nancy [name], [place]. Alkaline phosphate= 122. EKG completed.  ? ?Recent consult visits:  ?03-17-2022 [name], DO (Plastic Surgery). Visit for Panniculitis.  ? ?Hospital visits:  ?Medication Reconciliation was completed by comparing discharge summary, patient?s EMR and Pharmacy list, and upon discussion with patient. ?  ?Admitted to the hospital on 10/20/2021 due to Panniculitis ?Marland Kitchen Discharge date was 10/21/2021. Discharged from [facility].   ?  ?New?Medications Started at [facility] Discharge:?? ?-started: None ?  ?Medication Changes at Hospital Discharge: ?-Changed: None ?  ?Medications Discontinued at Hospital Discharge: ?-Stopped: None ?  ?Medications that remain the same after Hospital Discharge:??  ?-All other medications will remain the same.   ?  ? ?Medications: ?Outpatient Encounter Medications as of 03/23/2022  ?Medication Sig  ? Accu-Chek Softclix Lancets lancets Use to check blood sugars daily E11.69  ? atorvastatin (LIPITOR) 20 MG tablet Take 1 tablet (20 mg total) by mouth daily.  ? azelastine (OPTIVAR) 0.05 % ophthalmic solution INSTILL ONE DROP IN [facility]YE TWICE DAILY ?Strength: 0.05 %  ? blood glucose meter kit and supplies Dispense based on patient and insurance preference. Use up to four times daily as directed. (FOR ICD-10 E10.9, E11.9).  ? Blood Glucose Monitoring Suppl (ACCU-CHEK GUIDE) w/Device KIT 1 Device by Does not apply route daily.  ? Blood Pressure Monitor KIT USE TO CHECK BLOOD PRESSURE 110  ? Cholecalciferol 125 MCG (5000 UT) TABS Take 5,000 Units by mouth daily.  ? clobetasol (TEMOVATE) 0.05 % external solution Apply to scalp nightly  ? clobetasol ointment (TEMOVATE) 0.05 % Apply to elbows knees and back behind ears nightly  ? desoximetasone (TOPICORT)  0.25 % cream Apply to skin daily safe to use on face and skin folds  ? GLUCOSAMINE CHONDROITIN COMPLX PO Take 1 capsule by mouth daily. 1500 mg/1200 mg  ? Lactobacillus (PROBIOTIC ACIDOPHILUS PO) Take 0.5 mg by mouth daily.  ? levothyroxine (SYNTHROID) 150 MCG tablet Take 1 tablet (150 mcg total) by mouth daily before breakfast.  ? Magnesium 250 MG TABS Take 250 mg by mouth daily.  ? Multiple Vitamins-Minerals (MULTIVITAMIN WOMEN 50+) TABS Take 1 tablet by mouth daily.  ? ONETOUCH ULTRA test strip USE AS INSTRUCTED  ? Scar Treatment Products (STRATA TRIZ) GEL Apply 0.5 inches topically daily.  ? spironolactone (ALDACTONE) 50 MG tablet Take 1 tablet (50 mg total) by mouth daily.  ? ?No facility-administered encounter medications on file as of 03/23/2022.  ? ?Reviewed chart for medication changes ahead of medication coordination call. ? ?No OVs, Consults, since last care coordination call/Pharmacist visit.  ? ?No medication changes indicated OR if recent visit, treatment plan here. ? ?BP Readings from Last 3 Encounters:  ?02/18/22 132/74  ?11/19/21 112/74  ?10/21/21 (!) 93/48  ?  ?Lab Results  ?Component Value Date  ? HGBA1C 5.6 02/18/2022  ?  ? ?Patient obtains medications through Adherence Packaging  90 Days  ? ?Last adherence delivery included:  ?Atorvastatin 20 mg - 1 tablet daily at bedtime ?Levothyroxine 150 mcg- 1 tablet daily before breakfast ?Spironolactone 50 mg- 1 tablet  daily ?Azelastine drops 0.05%- 1 drop in each eye twice daily ?Onetouch test strips- Use as directed ?Onetouch lancets- Use as directed ? ?Patient declined (meds) last delivery 01-01-2022: ?Triamcinolone 0.1 % cream- apply thin layer topically as needed- PRN use ?Clobetasol  0.05 % scalp solution- apply to scalp nightly- Due to not using daily-sufficient supply ?Desoximetasone 0.25 %Topical cream- apply to skin daily-  Due to not using daily-sufficient supply ?Ketoconazole Cream 2% cream- apply topically twice daily under breast- Due to not  using daily-sufficient supply ? ?Patient is due for next adherence delivery on: 04-02-2022 ? ?Called patient and reviewed medications and coordinated delivery. ? ?This delivery to include: ?Onetouch test strips- Use as directed ?Atorvastatin 20 mg - 1 tablet daily at bedtime ?Levothyroxine 150 mcg- 1 tablet daily before breakfast ?Spironolactone 50 mg- 1 tablet daily at breakfast ?Azelastine drops 0.05%- 1 drop in each eye twice daily ? ?No acute or short fill need. ? ?Patient declined the following medications: ?None ? ?Patient needs refills for: ?None ? ?Confirmed delivery date of 04-02-2022, advised patient that pharmacy will contact them the morning of delivery. ? ?NOTES: ?Patient would like to switch to adherence packaging instead of vials. ? ?Care Gaps: ?AWV 01-13-2023 ? ?Star Rating Drugs: ?Atorvastatin 20 mg- Last filled 12-27-2021 [facility] ? ?Nancy [name] CMA ?Clinical Pharmacist Assistant ?[phone] ? ?

## 2022-03-25 ENCOUNTER — Encounter: Payer: Self-pay | Admitting: Plastic Surgery

## 2022-03-31 ENCOUNTER — Ambulatory Visit: Payer: Medicare HMO | Admitting: Surgical

## 2022-03-31 ENCOUNTER — Other Ambulatory Visit: Payer: Self-pay | Admitting: Nurse Practitioner

## 2022-03-31 DIAGNOSIS — M793 Panniculitis, unspecified: Secondary | ICD-10-CM | POA: Diagnosis not present

## 2022-03-31 DIAGNOSIS — Z9889 Other specified postprocedural states: Secondary | ICD-10-CM | POA: Diagnosis not present

## 2022-03-31 NOTE — Progress Notes (Signed)
? ?  Referring Provider ?[name], [place] ?[address] ?[address] ?[place],  [place] [address]  ? ?CC: No chief complaint on file. ?   ? ?Nancy [name] is an 71 y.o. female.  ?HPI: 71 year old female here for follow-up after panniculectomy with Dr. Marla [name] on 10/20/2021.  She is approximately 6 months postop.  She was evaluated in the clinic on 03/17/2022 for fullness in her abdomen.  During that appointment 80 cc of dark bloody drainage was aspirated.  They discussed ultrasound and possible drain placement.  Patient wanted to wait. ? ?She is here today for follow-up and reports that she has noticed an increase in size of her abdomen after aspiration.  She reports initially it was improved but feels firm again on the left lower abdomen.  She is otherwise doing well. ? ?Review of Systems ?General: No fevers or chills ? ?Physical Exam ? ?  02/18/2022  ? 11:20 AM 12/31/2021  ?  3:37 PM 11/19/2021  ? 12:09 PM  ?Vitals with BMI  ?Height '5\' 5"'$  '5\' 5"'$  '5\' 5"'$   ?Weight 220 lbs 13 oz 217 lbs 221 lbs 6 oz  ?BMI 36.74 36.11 36.84  ?Systolic 462  703  ?Diastolic 74  74  ?Pulse 66  71  ?  ?General:  No acute distress,  Alert and oriented, Non-Toxic, Normal speech and affect ?Abdomen: Nontender.  Umbilicus is viable.  Panniculectomy incision is well healed.  There is no erythema or cellulitic changes.  There is some fullness in the left abdomen, positive fluid wave with palpation.  No tenderness noted. ? ?Assessment/Plan ?130 cc of dark sanguinous fluid was aspirated from the abdomen using a sterile technique.  Dr. Marla [name] was present.  There were no complications.  Her abdomen felt much softer post aspiration. ? ?There are no signs of infection on exam.  Patient will call if her symptoms change or worsen.  Follow-up as needed. ? ?Nancy [name] ?03/31/2022, 12:37 PM  ? ? ?    ?

## 2022-04-23 ENCOUNTER — Encounter: Payer: Self-pay | Admitting: Surgical

## 2022-04-23 ENCOUNTER — Ambulatory Visit: Payer: Medicare HMO | Admitting: Surgical

## 2022-04-23 DIAGNOSIS — Z9889 Other specified postprocedural states: Secondary | ICD-10-CM

## 2022-04-23 NOTE — Progress Notes (Signed)
? ?  Referring Provider ?Nancy [name], [facility] ?[address] ?[address] ?[place],  [place] [address]  ? ?CC:  ?Chief Complaint  ?Patient presents with  ? Follow-up  ?   ? ?Nancy [name] is an 71 y.o. female.  ?HPI: 71 year old female here for follow-up after panniculectomy Dr. Marla [name] on 10/20/2021.  She is approximately 6 months postop.  At subsequent follow-up visits she has had dark sanguinous drainage aspirated from her abdomen.  Each time she has felt that it initially resolved but her subsequent follow-up felt that it had recurred.  Approximately 3 weeks ago she had 130 cc of dark sanguinous fluid aspirated from her abdomen.  ? ?Today she reports she feels as if the area is full again.  She has bothered by this.  We previously have discussed ultrasound and then possible ultrasound-guided drain placement with interventional radiology.   ? ?She is otherwise feeling well, no infectious symptoms.  She reports she would like to proceed with ultrasound and possible drain placement ? ? ?Review of Systems ?General: No fevers or chills ? ?Physical Exam ? ?  02/18/2022  ? 11:20 AM 12/31/2021  ?  3:37 PM 11/19/2021  ? 12:09 PM  ?Vitals with BMI  ?Height '5\' 5"'$  '5\' 5"'$  '5\' 5"'$   ?Weight 220 lbs 13 oz 217 lbs 221 lbs 6 oz  ?BMI 36.74 36.11 36.84  ?Systolic 945  038  ?Diastolic 74  74  ?Pulse 66  71  ?  ?General:  No acute distress,  Alert and oriented, Non-Toxic, Normal speech and affect ?Abdomen: Umbilicus is viable.  Abdominal incisions intact.  Subcutaneous fluid collection is difficult to palpate given the adipose tissue.  I do appreciate a slight fluid wave with palpation.  She does have some fullness in the left abdomen.  I do not appreciate any overlying skin changes or cellulitic changes. ? ?Assessment/Plan ? ?Plan for ultrasound of abdomen, possible placement of drain with interventional radiology.  Orders placed.  Recommend televisit in 2 weeks to check-in and confirm ultrasound has been scheduled or already completed.   Discussed with patient to call if symptoms change or she has any questions or concerns.  I do not see any signs of infection on exam. ? ? ? ?Nancy [name] ?04/23/2022, 4:32 PM  ? ? ?    ?

## 2022-05-06 ENCOUNTER — Other Ambulatory Visit: Payer: Self-pay | Admitting: Surgical

## 2022-05-06 DIAGNOSIS — Z9889 Other specified postprocedural states: Secondary | ICD-10-CM

## 2022-05-07 ENCOUNTER — Ambulatory Visit: Payer: Medicare HMO | Admitting: Surgical

## 2022-05-07 DIAGNOSIS — Z9889 Other specified postprocedural states: Secondary | ICD-10-CM

## 2022-05-07 DIAGNOSIS — M793 Panniculitis, unspecified: Secondary | ICD-10-CM

## 2022-05-07 NOTE — Progress Notes (Signed)
   Referring Provider [name], [address] [address] [address] [place],  [place] [address]   CC: No chief complaint on file.     Nancy [name] is an 71 y.o. female.  HPI: Patient is a 71 year old female who presents for telephone visit to discuss ultrasound to evaluate her abdomen for concern of fluid collection versus hematoma after panniculectomy on 10/20/2021.  She reports she is doing well, she reports she has the ultrasound scheduled for 05/11/2022, which is next week.  The patient gave consent to have this visit done by telemedicine / virtual visit, two identifiers were used to identify patient. This is also consent for access the chart and treat the patient via this visit. The patient is located at home.  I, the provider, am at the office.  We spent 5 minutes together for the visit.  Joined by phone.  She had some questions about the possibility of drain placement.  She also had some questions about if this was similar to ascites.  She has some questions about if surgical intervention will be necessary.  Physical Exam    02/18/2022   11:20 AM 12/31/2021    3:37 PM 11/19/2021   12:09 PM  Vitals with BMI  Height '5\' 5"'$  '5\' 5"'$  '5\' 5"'$   Weight 220 lbs 13 oz 217 lbs 221 lbs 6 oz  BMI 36.74 52.84 13.24  Systolic 401  027  Diastolic 74  74  Pulse 66  71    Psych: Behavior and mood is normal  Assessment/Plan Plan for ultrasound of lower abdomen next week with radiology for evaluation of fluid collection.  We will await results and discuss need for drain placement versus possible other interventions.  We discussed all of her questions, all of her questions were answered to her content.  Nancy [name] 05/07/2022, 10:18 AM

## 2022-05-11 ENCOUNTER — Ambulatory Visit (HOSPITAL_COMMUNITY)
Admission: RE | Admit: 2022-05-11 | Discharge: 2022-05-11 | Disposition: A | Discharge status: Home / Self Care | Payer: Medicare HMO | Source: Ambulatory Visit | Attending: Surgical | Admitting: Surgical

## 2022-05-11 DIAGNOSIS — R140 Abdominal distension (gaseous): Secondary | ICD-10-CM | POA: Insufficient documentation

## 2022-05-11 DIAGNOSIS — R1900 Intra-abdominal and pelvic swelling, mass and lump, unspecified site: Secondary | ICD-10-CM | POA: Diagnosis not present

## 2022-05-11 DIAGNOSIS — Z9889 Other specified postprocedural states: Secondary | ICD-10-CM | POA: Insufficient documentation

## 2022-05-19 ENCOUNTER — Ambulatory Visit: Payer: Medicare HMO | Admitting: Physician Assistant

## 2022-05-20 NOTE — Progress Notes (Unsigned)
Sandi Mariscal, MD  Nilsa Nutting D OK for US guided abdominal wall aspiration versus drain placement.   Sedation per pt request.   Cathren Harsh

## 2022-06-02 ENCOUNTER — Other Ambulatory Visit: Payer: Self-pay

## 2022-06-02 ENCOUNTER — Encounter: Payer: Self-pay | Admitting: Plastic Surgery

## 2022-06-02 ENCOUNTER — Ambulatory Visit: Payer: Medicare HMO | Admitting: Plastic Surgery

## 2022-06-02 ENCOUNTER — Encounter (HOSPITAL_BASED_OUTPATIENT_CLINIC_OR_DEPARTMENT_OTHER): Payer: Self-pay | Admitting: Plastic Surgery

## 2022-06-02 DIAGNOSIS — S301XXA Contusion of abdominal wall, initial encounter: Secondary | ICD-10-CM

## 2022-06-02 DIAGNOSIS — L7634 Postprocedural seroma of skin and subcutaneous tissue following other procedure: Secondary | ICD-10-CM | POA: Diagnosis not present

## 2022-06-02 NOTE — Progress Notes (Signed)
Patient ID: Nancy [name], female    DOB: 12-05-1951, 71 y.o.   MRN: [id number]   Chief Complaint  Patient presents with   Follow-up    The patient is a 71 year old female here for follow-up after undergoing a panniculectomy in October 2022.  Many months after her surgery she developed a seroma.  It can drains any times.  She had an ultrasound showing the collection.  One of her options is to have a drain put in.  Radiology did not do it at that time of the ultrasound but has it scheduled for next week.  The patient is frustrated with the recurrence and would like to have the scar tissue surgically removed.  I think this is very reasonable.     Review of Systems  Constitutional: Negative.   HENT: Negative.    Eyes: Negative.   Respiratory: Negative.  Negative for chest tightness.   Cardiovascular: Negative.   Gastrointestinal:  Positive for abdominal distention.  Endocrine: Negative.   Genitourinary: Negative.   Musculoskeletal: Negative.     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   Complication of anesthesia    pt states she woke up during a tonsillectomy when she was 28   Hyperlipidemia    Hypertension    Hypothyroidism 1980   Prediabetes    Thyroid disease     Past Surgical History:  Procedure Laterality Date   BREAST SURGERY     CHOLECYSTECTOMY     NASAL SEPTUM SURGERY  1990   PANNICULECTOMY N/A 10/20/2021   Procedure: PANNICULECTOMY;  Surgeon: Wallace Going, DO;  Location: [facility];  Service: Plastics;  Laterality: N/A;  3.5 hours   REDUCTION MAMMAPLASTY     TONSILLECTOMY     VENTRAL HERNIA REPAIR N/A 10/20/2021   Procedure: HERNIA REPAIR VENTRAL ADULT WITH MESH;  Surgeon: Coralie Keens, MD;  Location: [facility];  Service: General;  Laterality: N/A;      Current Outpatient Medications:    atorvastatin (LIPITOR) 20 MG tablet, Take 1 tablet (20 mg total) by mouth daily., Disp:  90 tablet, Rfl: 1   azelastine (OPTIVAR) 0.05 % ophthalmic solution, INSTILL ONE DROP IN [facility] EYE TWICE DAILY Strength: 0.05 %, Disp: 6 mL, Rfl: 3   blood glucose meter kit and supplies, Dispense based on patient and insurance preference. Use up to four times daily as directed. (FOR ICD-10 E10.9, E11.9)., Disp: 1 each, Rfl: 3   Blood Glucose Monitoring Suppl (ACCU-CHEK GUIDE) w/Device KIT, 1 Device by Does not apply route daily., Disp: 1 kit, Rfl: 0   Blood Pressure Monitor KIT, USE TO CHECK BLOOD PRESSURE 110, Disp: 1 kit, Rfl: 2   Cholecalciferol 125 MCG (5000 UT) TABS, Take 5,000 Units by mouth daily., Disp: , Rfl:    clobetasol (TEMOVATE) 0.05 % external solution, Apply to scalp nightly, Disp: 50 mL, Rfl: 6   clobetasol ointment (TEMOVATE) 0.05 %, Apply to elbows knees and back behind ears nightly, Disp: 60 g, Rfl: 4   desoximetasone (TOPICORT) 0.25 % cream, Apply to skin daily safe to use on face and skin folds, Disp: 100 g, Rfl: 4   GLUCOSAMINE CHONDROITIN COMPLX PO, Take 1 capsule by mouth daily. 1500 mg/1200 mg, Disp: , Rfl:    Lactobacillus (PROBIOTIC ACIDOPHILUS PO), Take 0.5 mg by mouth daily., Disp: , Rfl:    Lancets (ONETOUCH ULTRASOFT) lancets, USE TO check blood sugar  DAILY, Disp: 100 each, Rfl: 2   levothyroxine (SYNTHROID) 150 MCG tablet, Take 1 tablet (150 mcg total) by mouth daily before breakfast., Disp: 115 tablet, Rfl: 1   Magnesium 250 MG TABS, Take 250 mg by mouth daily., Disp: , Rfl:    Multiple Vitamins-Minerals (MULTIVITAMIN WOMEN 50+) TABS, Take 1 tablet by mouth daily., Disp: , Rfl:    ONETOUCH ULTRA test strip, USE AS INSTRUCTED, Disp: 100 strip, Rfl: 12   spironolactone (ALDACTONE) 50 MG tablet, Take 1 tablet (50 mg total) by mouth daily., Disp: 90 tablet, Rfl: 1   Objective:   There were no vitals filed for this visit.  Physical Exam Constitutional:      Appearance: Normal appearance.  HENT:     Head: Normocephalic and atraumatic.  Cardiovascular:     Rate  and Rhythm: Normal rate.     Pulses: Normal pulses.  Pulmonary:     Effort: Pulmonary effort is normal.  Abdominal:     General: There is distension.     Palpations: Abdomen is soft.  Skin:    Capillary Refill: Capillary refill takes less than 2 seconds.     Coloration: Skin is not jaundiced.     Findings: No bruising.  Neurological:     Mental Status: She is alert and oriented to person, place, and time.  Psychiatric:        Mood and Affect: Mood normal.        Behavior: Behavior normal.        Thought Content: Thought content normal.     Assessment & Plan:  Abdominal wall seroma, initial encounter  Plan for excision of abdominal seroma pocket and scar tissue next week if possible.  [place], DO

## 2022-06-02 NOTE — H&P (View-Only) (Signed)
Patient ID: Nancy [name], female    DOB: 12-05-1951, 71 y.o.   MRN: [id number]   Chief Complaint  Patient presents with   Follow-up    The patient is a 71 year old female here for follow-up after undergoing a panniculectomy in October 2022.  Many months after her surgery she developed a seroma.  It can drains any times.  She had an ultrasound showing the collection.  One of her options is to have a drain put in.  Radiology did not do it at that time of the ultrasound but has it scheduled for next week.  The patient is frustrated with the recurrence and would like to have the scar tissue surgically removed.  I think this is very reasonable.     Review of Systems  Constitutional: Negative.   HENT: Negative.    Eyes: Negative.   Respiratory: Negative.  Negative for chest tightness.   Cardiovascular: Negative.   Gastrointestinal:  Positive for abdominal distention.  Endocrine: Negative.   Genitourinary: Negative.   Musculoskeletal: Negative.     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   Complication of anesthesia    pt states she woke up during a tonsillectomy when she was 28   Hyperlipidemia    Hypertension    Hypothyroidism 1980   Prediabetes    Thyroid disease     Past Surgical History:  Procedure Laterality Date   BREAST SURGERY     CHOLECYSTECTOMY     NASAL SEPTUM SURGERY  1990   PANNICULECTOMY N/A 10/20/2021   Procedure: PANNICULECTOMY;  Surgeon: Wallace Going, DO;  Location: [facility];  Service: Plastics;  Laterality: N/A;  3.5 hours   REDUCTION MAMMAPLASTY     TONSILLECTOMY     VENTRAL HERNIA REPAIR N/A 10/20/2021   Procedure: HERNIA REPAIR VENTRAL ADULT WITH MESH;  Surgeon: Coralie Keens, MD;  Location: [facility];  Service: General;  Laterality: N/A;      Current Outpatient Medications:    atorvastatin (LIPITOR) 20 MG tablet, Take 1 tablet (20 mg total) by mouth daily., Disp:  90 tablet, Rfl: 1   azelastine (OPTIVAR) 0.05 % ophthalmic solution, INSTILL ONE DROP IN [facility] EYE TWICE DAILY Strength: 0.05 %, Disp: 6 mL, Rfl: 3   blood glucose meter kit and supplies, Dispense based on patient and insurance preference. Use up to four times daily as directed. (FOR ICD-10 E10.9, E11.9)., Disp: 1 each, Rfl: 3   Blood Glucose Monitoring Suppl (ACCU-CHEK GUIDE) w/Device KIT, 1 Device by Does not apply route daily., Disp: 1 kit, Rfl: 0   Blood Pressure Monitor KIT, USE TO CHECK BLOOD PRESSURE 110, Disp: 1 kit, Rfl: 2   Cholecalciferol 125 MCG (5000 UT) TABS, Take 5,000 Units by mouth daily., Disp: , Rfl:    clobetasol (TEMOVATE) 0.05 % external solution, Apply to scalp nightly, Disp: 50 mL, Rfl: 6   clobetasol ointment (TEMOVATE) 0.05 %, Apply to elbows knees and back behind ears nightly, Disp: 60 g, Rfl: 4   desoximetasone (TOPICORT) 0.25 % cream, Apply to skin daily safe to use on face and skin folds, Disp: 100 g, Rfl: 4   GLUCOSAMINE CHONDROITIN COMPLX PO, Take 1 capsule by mouth daily. 1500 mg/1200 mg, Disp: , Rfl:    Lactobacillus (PROBIOTIC ACIDOPHILUS PO), Take 0.5 mg by mouth daily., Disp: , Rfl:    Lancets (ONETOUCH ULTRASOFT) lancets, USE TO check blood sugar  DAILY, Disp: 100 each, Rfl: 2   levothyroxine (SYNTHROID) 150 MCG tablet, Take 1 tablet (150 mcg total) by mouth daily before breakfast., Disp: 115 tablet, Rfl: 1   Magnesium 250 MG TABS, Take 250 mg by mouth daily., Disp: , Rfl:    Multiple Vitamins-Minerals (MULTIVITAMIN WOMEN 50+) TABS, Take 1 tablet by mouth daily., Disp: , Rfl:    ONETOUCH ULTRA test strip, USE AS INSTRUCTED, Disp: 100 strip, Rfl: 12   spironolactone (ALDACTONE) 50 MG tablet, Take 1 tablet (50 mg total) by mouth daily., Disp: 90 tablet, Rfl: 1   Objective:   There were no vitals filed for this visit.  Physical Exam Constitutional:      Appearance: Normal appearance.  HENT:     Head: Normocephalic and atraumatic.  Cardiovascular:     Rate  and Rhythm: Normal rate.     Pulses: Normal pulses.  Pulmonary:     Effort: Pulmonary effort is normal.  Abdominal:     General: There is distension.     Palpations: Abdomen is soft.  Skin:    Capillary Refill: Capillary refill takes less than 2 seconds.     Coloration: Skin is not jaundiced.     Findings: No bruising.  Neurological:     Mental Status: She is alert and oriented to person, place, and time.  Psychiatric:        Mood and Affect: Mood normal.        Behavior: Behavior normal.        Thought Content: Thought content normal.     Assessment & Plan:  Abdominal wall seroma, initial encounter  Plan for excision of abdominal seroma pocket and scar tissue next week if possible.  Adonay Scheier S Rakesha Dalporto, DO 

## 2022-06-05 ENCOUNTER — Encounter (HOSPITAL_BASED_OUTPATIENT_CLINIC_OR_DEPARTMENT_OTHER)
Admission: RE | Admit: 2022-06-05 | Discharge: 2022-06-05 | Disposition: A | Discharge status: Home / Self Care | Payer: Medicare HMO | Source: Ambulatory Visit | Attending: Plastic Surgery | Admitting: Plastic Surgery

## 2022-06-05 DIAGNOSIS — Z01812 Encounter for preprocedural laboratory examination: Secondary | ICD-10-CM | POA: Diagnosis not present

## 2022-06-05 DIAGNOSIS — I10 Essential (primary) hypertension: Secondary | ICD-10-CM | POA: Insufficient documentation

## 2022-06-05 LAB — BASIC METABOLIC PANEL
Anion gap: 9 (ref 5–15)
BUN: 15 mg/dL (ref 8–23)
CO2: 28 mmol/L (ref 22–32)
Calcium: 9.7 mg/dL (ref 8.9–10.3)
Chloride: 105 mmol/L (ref 98–111)
Creatinine, Ser: 1.11 mg/dL — ABNORMAL HIGH (ref 0.44–1.00)
GFR, Estimated: 53 mL/min — ABNORMAL LOW (ref >60)
Glucose, Bld: 83 mg/dL (ref 70–99)
Potassium: 4.5 mmol/L (ref 3.5–5.1)
Sodium: 142 mmol/L (ref 135–145)

## 2022-06-08 ENCOUNTER — Telehealth: Payer: Self-pay | Admitting: Plastic Surgery

## 2022-06-08 NOTE — Telephone Encounter (Signed)
Patient called to speak with provider; she has questions about the surgery scheduled for this Wednesday. Her questions are as follows:  1) How likely is it for another seroma to occur after surgery? 2) Will she have to have drainage tubes again? 3) How deep is the cut? 4) What's the recovery time? 5) Will Dr. Marla Roe have to go under muscle tissue? 6) What are the implications for any future abdominal surgeries if any at all?  Please advise at [phone]

## 2022-06-09 ENCOUNTER — Ambulatory Visit (HOSPITAL_COMMUNITY): Payer: Medicare HMO

## 2022-06-09 ENCOUNTER — Other Ambulatory Visit: Payer: Self-pay | Admitting: Student

## 2022-06-09 MED ORDER — TRAMADOL HCL 50 MG PO TABS: 50.0000 mg | ORAL_TABLET | Freq: Four times a day (QID) | ORAL | 0 refills | Status: DC | PRN

## 2022-06-09 MED ORDER — ONDANSETRON HCL 4 MG PO TABS: 4.0000 mg | ORAL_TABLET | Freq: Three times a day (TID) | ORAL | 0 refills | Status: DC | PRN

## 2022-06-09 MED ORDER — CEPHALEXIN 500 MG PO CAPS: 500.0000 mg | ORAL_CAPSULE | Freq: Four times a day (QID) | ORAL | 0 refills | Status: AC

## 2022-06-09 NOTE — Progress Notes (Signed)
Call patient regarding questions she sent via [facility].  We discussed her surgery tomorrow which will include excision of abdominal wall seroma and likely placement of a drain.  We discussed some of the risks associated with surgery including the possibility of a hematoma or seroma formation and the possibility of returning to the operating room.  We also discussed what the postoperative recovery may look like.  I discussed with her that the recovery is dependent on each case and the course of healing.  Patient acknowledged.  We also discussed postoperative medications.  I discussed with patient that I will order her Keflex, tramadol and Zofran.  Discussed with patient that she should pick these up today so that she may have them to take after surgery.  Patient acknowledged.  All of the patient's questions were answered.  I instructed the patient to call back if she has any other questions or concerns.

## 2022-06-10 ENCOUNTER — Encounter (HOSPITAL_BASED_OUTPATIENT_CLINIC_OR_DEPARTMENT_OTHER)
Admission: RE | Disposition: A | Discharge status: Home / Self Care | Payer: Self-pay | Source: Home / Self Care | Attending: Plastic Surgery

## 2022-06-10 ENCOUNTER — Ambulatory Visit (HOSPITAL_BASED_OUTPATIENT_CLINIC_OR_DEPARTMENT_OTHER): Payer: Medicare HMO | Admitting: Anesthesiology

## 2022-06-10 ENCOUNTER — Other Ambulatory Visit: Payer: Self-pay

## 2022-06-10 ENCOUNTER — Ambulatory Visit (HOSPITAL_BASED_OUTPATIENT_CLINIC_OR_DEPARTMENT_OTHER)
Admission: RE | Admit: 2022-06-10 | Discharge: 2022-06-10 | Disposition: A | Discharge status: Home / Self Care | Payer: Medicare HMO | Attending: Plastic Surgery | Admitting: Plastic Surgery

## 2022-06-10 ENCOUNTER — Encounter (HOSPITAL_BASED_OUTPATIENT_CLINIC_OR_DEPARTMENT_OTHER): Payer: Self-pay | Admitting: Plastic Surgery

## 2022-06-10 ENCOUNTER — Encounter: Payer: Self-pay | Admitting: Plastic Surgery

## 2022-06-10 DIAGNOSIS — Z87891 Personal history of nicotine dependence: Secondary | ICD-10-CM | POA: Insufficient documentation

## 2022-06-10 DIAGNOSIS — Z6836 Body mass index (BMI) 36.0-36.9, adult: Secondary | ICD-10-CM | POA: Diagnosis not present

## 2022-06-10 DIAGNOSIS — I10 Essential (primary) hypertension: Secondary | ICD-10-CM | POA: Diagnosis not present

## 2022-06-10 DIAGNOSIS — E669 Obesity, unspecified: Secondary | ICD-10-CM | POA: Insufficient documentation

## 2022-06-10 DIAGNOSIS — E039 Hypothyroidism, unspecified: Secondary | ICD-10-CM | POA: Insufficient documentation

## 2022-06-10 DIAGNOSIS — L7634 Postprocedural seroma of skin and subcutaneous tissue following other procedure: Secondary | ICD-10-CM | POA: Diagnosis not present

## 2022-06-10 DIAGNOSIS — M7981 Nontraumatic hematoma of soft tissue: Secondary | ICD-10-CM | POA: Diagnosis not present

## 2022-06-10 DIAGNOSIS — S301XXA Contusion of abdominal wall, initial encounter: Secondary | ICD-10-CM | POA: Diagnosis not present

## 2022-06-10 HISTORY — DX: Prediabetes: R73.03

## 2022-06-10 HISTORY — PX: LIPOMA EXCISION: SHX5283

## 2022-06-10 SURGERY — EXCISION LIPOMA
Anesthesia: General | Site: Abdomen

## 2022-06-10 MED ORDER — SODIUM CHLORIDE 0.9 % IV SOLN: 250.0000 mL | INTRAVENOUS | Status: DC | PRN

## 2022-06-10 MED ORDER — CHLORHEXIDINE GLUCONATE CLOTH 2 % EX PADS: 6.0000 | MEDICATED_PAD | Freq: Once | CUTANEOUS | Status: DC

## 2022-06-10 MED ORDER — ONDANSETRON HCL 4 MG/2ML IJ SOLN
INTRAMUSCULAR | Status: DC | PRN
  Administered 2022-06-10: 4 mg via INTRAVENOUS

## 2022-06-10 MED ORDER — CEFAZOLIN SODIUM-DEXTROSE 2-4 GM/100ML-% IV SOLN
INTRAVENOUS | Status: AC
  Filled 2022-06-10: qty 100

## 2022-06-10 MED ORDER — SODIUM CHLORIDE 0.9 % IV SOLN
INTRAVENOUS | Status: DC | PRN
  Administered 2022-06-10: 500 mL

## 2022-06-10 MED ORDER — AMISULPRIDE (ANTIEMETIC) 5 MG/2ML IV SOLN
10.0000 mg | Freq: Once | INTRAVENOUS | Status: AC | PRN
Start: 2022-06-10 — End: 2022-06-10
  Administered 2022-06-10: 10 mg via INTRAVENOUS

## 2022-06-10 MED ORDER — HYDROMORPHONE HCL 1 MG/ML IJ SOLN
INTRAMUSCULAR | Status: AC
  Filled 2022-06-10: qty 1

## 2022-06-10 MED ORDER — DEXAMETHASONE SODIUM PHOSPHATE 4 MG/ML IJ SOLN
INTRAMUSCULAR | Status: DC | PRN
  Administered 2022-06-10: 4 mg via INTRAVENOUS

## 2022-06-10 MED ORDER — LACTATED RINGERS IV SOLN: INTRAVENOUS | Status: DC

## 2022-06-10 MED ORDER — 0.9 % SODIUM CHLORIDE (POUR BTL) OPTIME
TOPICAL | Status: DC | PRN
  Administered 2022-06-10: 1000 mL

## 2022-06-10 MED ORDER — PROPOFOL 10 MG/ML IV BOLUS
INTRAVENOUS | Status: AC
  Filled 2022-06-10: qty 20

## 2022-06-10 MED ORDER — AMISULPRIDE (ANTIEMETIC) 5 MG/2ML IV SOLN
INTRAVENOUS | Status: AC
  Filled 2022-06-10: qty 4

## 2022-06-10 MED ORDER — PROMETHAZINE HCL 25 MG/ML IJ SOLN: 6.2500 mg | INTRAMUSCULAR | Status: DC | PRN

## 2022-06-10 MED ORDER — HYDROMORPHONE HCL 1 MG/ML IJ SOLN: 0.2500 mg | INTRAMUSCULAR | Status: DC | PRN

## 2022-06-10 MED ORDER — EPHEDRINE SULFATE (PRESSORS) 50 MG/ML IJ SOLN
INTRAMUSCULAR | Status: DC | PRN
  Administered 2022-06-10 (×2): 10 mg via INTRAVENOUS

## 2022-06-10 MED ORDER — ACETAMINOPHEN 325 MG PO TABS: 650.0000 mg | ORAL_TABLET | ORAL | Status: DC | PRN

## 2022-06-10 MED ORDER — OXYCODONE HCL 5 MG/5ML PO SOLN: 5.0000 mg | Freq: Once | ORAL | Status: DC | PRN

## 2022-06-10 MED ORDER — LIDOCAINE HCL (CARDIAC) PF 100 MG/5ML IV SOSY
PREFILLED_SYRINGE | INTRAVENOUS | Status: DC | PRN
  Administered 2022-06-10: 60 mg via INTRAVENOUS

## 2022-06-10 MED ORDER — OXYCODONE HCL 5 MG PO TABS: 5.0000 mg | ORAL_TABLET | ORAL | Status: DC | PRN

## 2022-06-10 MED ORDER — OXYCODONE HCL 5 MG PO TABS: 5.0000 mg | ORAL_TABLET | Freq: Once | ORAL | Status: DC | PRN

## 2022-06-10 MED ORDER — FENTANYL CITRATE (PF) 100 MCG/2ML IJ SOLN: 25.0000 ug | INTRAMUSCULAR | Status: DC | PRN

## 2022-06-10 MED ORDER — FENTANYL CITRATE (PF) 100 MCG/2ML IJ SOLN
INTRAMUSCULAR | Status: AC
  Filled 2022-06-10: qty 2

## 2022-06-10 MED ORDER — LIDOCAINE-EPINEPHRINE 1 %-1:100000 IJ SOLN
INTRAMUSCULAR | Status: DC | PRN
  Administered 2022-06-10: 16 mL

## 2022-06-10 MED ORDER — SODIUM CHLORIDE 0.9% FLUSH: 3.0000 mL | INTRAVENOUS | Status: DC | PRN

## 2022-06-10 MED ORDER — PROPOFOL 10 MG/ML IV BOLUS
INTRAVENOUS | Status: DC | PRN
  Administered 2022-06-10: 50 mg via INTRAVENOUS
  Administered 2022-06-10: 100 mg via INTRAVENOUS
  Administered 2022-06-10: 200 mg via INTRAVENOUS
  Administered 2022-06-10: 50 mg via INTRAVENOUS

## 2022-06-10 MED ORDER — HYDROMORPHONE HCL 1 MG/ML IJ SOLN
INTRAMUSCULAR | Status: DC | PRN
  Administered 2022-06-10: .5 mg via INTRAVENOUS

## 2022-06-10 MED ORDER — FENTANYL CITRATE (PF) 100 MCG/2ML IJ SOLN
INTRAMUSCULAR | Status: DC | PRN
  Administered 2022-06-10 (×2): 25 ug via INTRAVENOUS
  Administered 2022-06-10: 50 ug via INTRAVENOUS
  Administered 2022-06-10 (×2): 25 ug via INTRAVENOUS
  Administered 2022-06-10: 50 ug via INTRAVENOUS

## 2022-06-10 MED ORDER — CEFAZOLIN SODIUM-DEXTROSE 2-4 GM/100ML-% IV SOLN
2.0000 g | INTRAVENOUS | Status: AC
  Administered 2022-06-10: 2 g via INTRAVENOUS

## 2022-06-10 MED ORDER — ACETAMINOPHEN 325 MG RE SUPP: 650.0000 mg | RECTAL | Status: DC | PRN

## 2022-06-10 MED ORDER — SODIUM CHLORIDE 0.9% FLUSH
3.0000 mL | Freq: Two times a day (BID) | INTRAVENOUS | Status: DC
Start: 2022-06-10 — End: 2022-06-10

## 2022-06-10 MED ORDER — SODIUM CHLORIDE 0.9 % IV SOLN
INTRAVENOUS | Status: AC
  Filled 2022-06-10: qty 10

## 2022-06-10 SURGICAL SUPPLY — 62 items
BAG DECANTER FOR FLEXI CONT (MISCELLANEOUS) ×1 IMPLANT
BINDER ABDOMINAL 12 ML 46-62 (SOFTGOODS) ×1 IMPLANT
BIOPATCH RED 1 DISK 7.0 (GAUZE/BANDAGES/DRESSINGS) IMPLANT
BLADE CLIPPER SURG (BLADE) IMPLANT
BLADE HEX COATED 2.75 (ELECTRODE) IMPLANT
BLADE SURG 15 STRL LF DISP TIS (BLADE) ×1 IMPLANT
BLADE SURG 15 STRL SS (BLADE) ×1
CANISTER SUCT 1200ML W/VALVE (MISCELLANEOUS) ×2 IMPLANT
COLLAGEN CELLERATERX 1 GRAM (Miscellaneous) ×1 IMPLANT
COLLAGEN CELLERATERX 5 GRAM (MISCELLANEOUS) ×1 IMPLANT
COVER BACK TABLE 60X90IN (DRAPES) ×2 IMPLANT
COVER MAYO STAND STRL (DRAPES) ×2 IMPLANT
DERMABOND ADVANCED (GAUZE/BANDAGES/DRESSINGS) ×2
DERMABOND ADVANCED .7 DNX12 (GAUZE/BANDAGES/DRESSINGS) ×1 IMPLANT
DRAIN CHANNEL 19F RND (DRAIN) ×1 IMPLANT
DRAPE LAPAROSCOPIC ABDOMINAL (DRAPES) ×1 IMPLANT
DRAPE LAPAROTOMY 100X72 PEDS (DRAPES) IMPLANT
DRAPE U-SHAPE 76X120 STRL (DRAPES) IMPLANT
DRSG PAD ABDOMINAL 8X10 ST (GAUZE/BANDAGES/DRESSINGS) ×2 IMPLANT
ELECT BLADE 4.0 EZ CLEAN MEGAD (MISCELLANEOUS) ×2
ELECT COATED BLADE 2.86 ST (ELECTRODE) ×1 IMPLANT
ELECT REM PT RETURN 9FT ADLT (ELECTROSURGICAL) ×2
ELECTRODE BLDE 4.0 EZ CLN MEGD (MISCELLANEOUS) ×1 IMPLANT
ELECTRODE REM PT RTRN 9FT ADLT (ELECTROSURGICAL) ×1 IMPLANT
EVACUATOR SILICONE 100CC (DRAIN) ×1 IMPLANT
GAUZE SPONGE 4X4 12PLY STRL LF (GAUZE/BANDAGES/DRESSINGS) IMPLANT
GLOVE BIO SURGEON STRL SZ 6.5 (GLOVE) ×6 IMPLANT
GLOVE BIOGEL M STRL SZ7.5 (GLOVE) ×1 IMPLANT
GLOVE BIOGEL PI IND STRL 7.0 (GLOVE) ×1 IMPLANT
GLOVE BIOGEL PI IND STRL 7.5 (GLOVE) IMPLANT
GLOVE BIOGEL PI INDICATOR 7.0 (GLOVE) ×3
GLOVE BIOGEL PI INDICATOR 7.5 (GLOVE) ×1
GOWN STRL REUS W/ TWL LRG LVL3 (GOWN DISPOSABLE) ×2 IMPLANT
GOWN STRL REUS W/ TWL XL LVL3 (GOWN DISPOSABLE) IMPLANT
GOWN STRL REUS W/TWL LRG LVL3 (GOWN DISPOSABLE) ×4
GOWN STRL REUS W/TWL XL LVL3 (GOWN DISPOSABLE) ×1
HEMOSTAT ARISTA ABSORB 3G PWDR (HEMOSTASIS) ×2 IMPLANT
NDL HYPO 25X1 1.5 SAFETY (NEEDLE) ×1 IMPLANT
NEEDLE HYPO 25X1 1.5 SAFETY (NEEDLE) ×4 IMPLANT
NS IRRIG 1000ML POUR BTL (IV SOLUTION) ×1 IMPLANT
PACK BASIN DAY SURGERY FS (CUSTOM PROCEDURE TRAY) ×2 IMPLANT
PENCIL SMOKE EVACUATOR (MISCELLANEOUS) ×3 IMPLANT
SPONGE GAUZE 2X2 8PLY STRL LF (GAUZE/BANDAGES/DRESSINGS) IMPLANT
SPONGE T-LAP 18X18 ~~LOC~~+RFID (SPONGE) ×4 IMPLANT
STRIP CLOSURE SKIN 1/2X4 (GAUZE/BANDAGES/DRESSINGS) ×4 IMPLANT
SUCTION FRAZIER HANDLE 10FR (MISCELLANEOUS)
SUCTION TUBE FRAZIER 10FR DISP (MISCELLANEOUS) IMPLANT
SUT ETHILON 3 0 PS 1 (SUTURE) ×1 IMPLANT
SUT ETHILON 4 0 PS 2 18 (SUTURE) IMPLANT
SUT MNCRL AB 4-0 PS2 18 (SUTURE) ×3 IMPLANT
SUT MON AB 3-0 SH 27 (SUTURE) ×2
SUT MON AB 3-0 SH27 (SUTURE) IMPLANT
SUT MON AB 5-0 PS2 18 (SUTURE) ×1 IMPLANT
SUT PDS 3-0 CT2 (SUTURE) ×4
SUT PDS II 3-0 CT2 27 ABS (SUTURE) IMPLANT
SUT SILK 3 0 SH 30 (SUTURE) ×1 IMPLANT
SYR BULB IRRIG 60ML STRL (SYRINGE) ×2 IMPLANT
SYR CONTROL 10ML LL (SYRINGE) ×3 IMPLANT
TOWEL GREEN STERILE FF (TOWEL DISPOSABLE) ×3 IMPLANT
TRAY DSU PREP LF (CUSTOM PROCEDURE TRAY) ×2 IMPLANT
TUBE CONNECTING 20X1/4 (TUBING) ×1 IMPLANT
YANKAUER SUCT BULB TIP NO VENT (SUCTIONS) ×2 IMPLANT

## 2022-06-10 NOTE — Op Note (Signed)
DATE OF OPERATION: 06/10/2022  LOCATION: [facility]  PREOPERATIVE DIAGNOSIS: Seroma / hematoma capsule of abdomen  POSTOPERATIVE DIAGNOSIS: Same  PROCEDURE: Excision of two seroma pockets / capsules Right 6 x 15 cm Left 6 x 10 cm Placement of cellerate 6 gm  SURGEON: Jabin Tapp Sanger Ashten Prats, DO  ASSISTANT: Donnamarie Rossetti, PA  EBL: 50 cc (300 cc old hematoma/seroma)  CONDITION: Stable  COMPLICATIONS: None  INDICATION: The patient, Nancy [name], is a 71 y.o. 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  The cause is unclear but likely the area was injured without the patient realizing it which caused the pocket and scar formation.  PROCEDURE DETAILS:  The patient was seen prior to surgery and marked.  The IV antibiotics were given. The patient was taken to the operating room and given a general anesthetic. A standard time out was performed and all information was confirmed by those in the room. SCDs were placed.   The abdomen was prepped and draped.  The local was placed in the previous incision of the abdomen.  The bovie was used to dissect to the fascia and the seroma pocket was identified.  The 6 x 15 cm capsule was released from the surrounding tissue.  There was approximately 300 cc of serosanginous fluid in the capsule.  This was sent as specimen from the left.  The capsule tracked to the midline so an additional incision was made to get the capsule.  There was another one at the right side.  The #15 blade was used to make the incision.  The bovie was used to dissect to the capsule.  The 6 x 10 cm capsule was released from the surrounding tissue with the bovie.  Hemostasis was achieved with the electrocautery for all areas.  The pockets were irrigated with saline and antibiotic solution.  Arista was placed in the midline and right pocket.  The cellerate was placed in the left and midline  pocket.  The drain was placed and secured with the 3-0 Silk on the left.  The deep layers were closed with the 3-0 PDS.  The deep layers were closed with the 3-0 Monocryl.  The skin was closed with the 4-0 Monocryl.  Derma bond and steri strips with a honeycomb dressing was applied.  An abdominal binder was placed.  The patient was allowed to wake up and taken to recovery room in stable condition at the end of the case. The family was notified at the end of the case.   The advanced practice practitioner (APP) assisted throughout the case.  The APP was essential in retraction and counter traction when needed to make the case progress smoothly.  This retraction and assistance made it possible to see the tissue plans for the procedure.  The assistance was needed for blood control, tissue re-approximation and assisted with closure of the incision site.

## 2022-06-10 NOTE — Anesthesia Preprocedure Evaluation (Signed)
Anesthesia Evaluation  Patient identified by MRN, date of birth, ID band Patient awake    Reviewed: Allergy & Precautions, NPO status , Patient's Chart, lab work & pertinent test results  Airway Mallampati: II  TM Distance: >3 FB Neck ROM: Full    Dental  (+) Dental Advisory Given, Teeth Intact   Pulmonary former smoker,    Pulmonary exam normal breath sounds clear to auscultation       Cardiovascular hypertension, Pt. on medications Normal cardiovascular exam Rhythm:Regular Rate:Normal  Echo 02/2021 1. Left ventricular ejection fraction, by estimation, is 60 to 65%. The left ventricle has normal function. The left ventricle has no regional wall motion abnormalities. Left ventricular diastolic parameters were normal.  2. Right ventricular systolic function is normal. The right ventricular size is normal. Tricuspid regurgitation signal is inadequate for assessing PA pressure.  3. The mitral valve is grossly normal. Trivial mitral valve  regurgitation. No evidence of mitral stenosis.  4. The aortic valve is tricuspid. There is mild calcification of the aortic valve. Aortic valve regurgitation is not visualized. Mild aortic valve sclerosis is present, with no evidence of aortic valve stenosis.  5. The inferior vena cava is normal in size with greater than 50% respiratory variability, suggesting right atrial pressure of 3 mmHg.   Neuro/Psych negative neurological ROS     GI/Hepatic negative GI ROS, Neg liver ROS,   Endo/Other  Hypothyroidism   Renal/GU negative Renal ROS     Musculoskeletal negative musculoskeletal ROS (+)   Abdominal (+) + obese,   Peds  Hematology  (+) Blood dyscrasia, anemia ,   Anesthesia Other Findings   Reproductive/Obstetrics                             Anesthesia Physical  Anesthesia Plan  ASA: 2  Anesthesia Plan: General   Post-op Pain Management: Dilaudid IV    Induction: Intravenous  PONV Risk Score and Plan: 3 and Ondansetron, Dexamethasone, Treatment may vary due to age or medical condition and Midazolam  Airway Management Planned: Oral ETT  Additional Equipment: None  Intra-op Plan:   Post-operative Plan: Extubation in OR  Informed Consent: I have reviewed the patients History and Physical, chart, labs and discussed the procedure including the risks, benefits and alternatives for the proposed anesthesia with the patient or authorized representative who has indicated his/her understanding and acceptance.     Dental advisory given  Plan Discussed with: CRNA  Anesthesia Plan Comments: (PAT note written 10/17/2021 by Myra Gianotti, PA-C. )        Anesthesia Quick Evaluation

## 2022-06-10 NOTE — Anesthesia Postprocedure Evaluation (Signed)
Anesthesia Post Note  Patient: Nancy [name]  Procedure(s) Performed: EXCISION ABDOMINAL WALL SEROMA (Abdomen)     Patient location during evaluation: PACU Anesthesia Type: General Level of consciousness: awake and alert Pain management: pain level controlled Vital Signs Assessment: post-procedure vital signs reviewed and stable Respiratory status: spontaneous breathing, nonlabored ventilation and respiratory function stable Cardiovascular status: blood pressure returned to baseline and stable Postop Assessment: no apparent nausea or vomiting Anesthetic complications: no   No notable events documented.  Last Vitals:  Vitals:   06/10/22 1630 06/10/22 1716  BP: (!) 124/54 135/65  Pulse:  62  Resp: 14 16  Temp:  37 C  SpO2: 93% 95%    Last Pain:  Vitals:   06/10/22 1716  TempSrc:   PainSc: 0-No pain                 Lynda Rainwater

## 2022-06-10 NOTE — Interval H&P Note (Signed)
History and Physical Interval Note:  06/10/2022 12:29 PM  Nancy [name]  has presented today for surgery, with the diagnosis of Abdominal wall seroma.  The various methods of treatment have been discussed with the patient and family. After consideration of risks, benefits and other options for treatment, the patient has consented to  Procedure(s) with comments: EXCISION ABDOMINALWALL SEROMA (N/A) - 45 MINUTES as a surgical intervention.  The patient's history has been reviewed, patient examined, no change in status, stable for surgery.  I have reviewed the patient's chart and labs.  Questions were answered to the patient's satisfaction.   Physical exam done and RRR and lungs clear.  Loel Lofty Kellis Mcadam

## 2022-06-10 NOTE — Transfer of Care (Signed)
Immediate Anesthesia Transfer of Care Note  Patient: Nancy [name]  Procedure(s) Performed: EXCISION ABDOMINAL WALL SEROMA (Abdomen)  Patient Location: PACU  Anesthesia Type:General  Level of Consciousness: sedated  Airway & Oxygen Therapy: Patient Spontanous Breathing and Patient connected to face mask oxygen  Post-op Assessment: Report given to RN and Post -op Vital signs reviewed and stable  Post vital signs: Reviewed and stable  Last Vitals:  Vitals Value Taken Time  BP 117/59 06/10/22 1538  Temp    Pulse 67 06/10/22 1539  Resp 15 06/10/22 1539  SpO2 97 % 06/10/22 1539  Vitals shown include unvalidated device data.  Last Pain:  Vitals:   06/10/22 1153  TempSrc: Oral  PainSc: 0-No pain      Patients Stated Pain Goal: 6 (68/08/81 1031)  Complications: No notable events documented.

## 2022-06-10 NOTE — Anesthesia Procedure Notes (Signed)
Procedure Name: LMA Insertion Date/Time: 06/10/2022 3:07 AM  Performed by: Maryella Shivers, CRNAPre-anesthesia Checklist: Patient identified, Emergency Drugs available, Suction available and Patient being monitored Patient Re-evaluated:Patient Re-evaluated prior to induction Oxygen Delivery Method: Circle system utilized Preoxygenation: Pre-oxygenation with 100% oxygen Induction Type: IV induction Ventilation: Mask ventilation without difficulty LMA: LMA inserted LMA Size: 4.0 Number of attempts: 2 Airway Equipment and Method: Bite block Placement Confirmation: positive ETCO2 Tube secured with: Tape Dental Injury: Teeth and Oropharynx as per pre-operative assessment

## 2022-06-10 NOTE — Discharge Instructions (Addendum)
Post Anesthesia Home Care Instructions  Activity: Get plenty of rest for the remainder of the day. A responsible individual must stay with you for 24 hours following the procedure.  For the next 24 hours, DO NOT: -Drive a car -Paediatric nurse -Drink alcoholic beverages -Take any medication unless instructed by your physician -Make any legal decisions or sign important papers.  Meals: Start with liquid foods such as gelatin or soup. Progress to regular foods as tolerated. Avoid greasy, spicy, heavy foods. If nausea and/or vomiting occur, drink only clear liquids until the nausea and/or vomiting subsides. Call your physician if vomiting continues.  Special Instructions/Symptoms: Your throat may feel dry or sore from the anesthesia or the breathing tube placed in your throat during surgery. If this causes discomfort, gargle with warm salt water. The discomfort should disappear within 24 hours.  If you had a scopolamine patch placed behind your ear for the management of post- operative nausea and/or vomiting:  1. The medication in the patch is effective for 72 hours, after which it should be removed.  Wrap patch in a tissue and discard in the trash. Wash hands thoroughly with soap and water. 2. You may remove the patch earlier than 72 hours if you experience unpleasant side effects which may include dry mouth, dizziness or visual disturbances. 3. Avoid touching the patch. Wash your hands with soap and water after contact with the patch.          JP Drain [employer] this sheet to all of your post-operative appointments while you have your drains. Please measure your drains by CC's or ML's. Make sure you drain and measure your JP Drains 3 times per day. At the end of each day, add up totals for the left side and add up totals for the right side.    ( 9 am )     ( 3 pm )        ( 9 pm )                Date L  R  L  R  L  R  Total L/R                                                                                                                INSTRUCTIONS FOR AFTER ABDOMINAL SURGERY  You will likely have some questions about what to expect following your operation.  The following information will help you and your family understand what to expect when you get home.  Following these guidelines will help ensure a smooth recovery and reduce risks of complications.  Postoperative instructions include information on: diet, wound care, medications and physical activity.  AFTER SURGERY Expect to go home after the procedure.  In some cases, you may need to spend one night in the hospital for observation.  DIET This surgery does not require a specific diet.  However, the healthier you eat the better your body can heal. It is important to increasing your protein intake.  Limit  foods with high sugar and  carbohydrate content.  Focus on vegetables, meat and other protein sources if you are vegan or vegetarian.  If you undergo liposuction during your procedure it is very important to drink 8 oz of water every hour while awake for 2 days.  If your urine is bright yellow, then it is concentrated, and you need to drink more water.  If you find you are persistently nauseated or unable to take in liquids let us know.  NO TOBACCO USE or EXPOSURE.  This will slow your healing process and increase the risk of a wound.  WOUND CARE Leave the abdominal binder in place for 3 days.  Then you can remove it and shower.  Replace the binder or spanx after your shower.   You may have Topifoam or Lipofoam on.  It is soft and spongy and helps keep you from getting creases if you have liposuction.  This can be removed before the shower and then replaced.  If you need more it is available on [place] as lipofoam.  If you have steri-strips / tape directly attached to your skin leave them in place. It is OK to get these wet.  No baths, pools or hot tubs for four weeks. We close your incision to leave the smallest  and best-looking scar. No ointment or creams on your incisions until cleared by your surgeon.  No Neosporin (Too many skin reactions with this one).  After the steri-strips are off can use Mederma or Skinuva and start massaging the scar. Continue to wear the binder/spanx or Ace wrap around the clock, including while sleeping, for 6 weeks. This provides added comfort and helps reduce the fluid accumulation at the surgery site.  ACTIVITY No heavy lifting until cleared by the doctor.  For example, no more than a half-gallon of milk.  It is OK to walk and you are encouraged to move your legs to help decrease your risk of getting a blood clot.  It will also help keep you from getting deconditioned.  Every 1 to 2 hours get up and walk for 5 minutes. This will help with a quicker recovery back to normal.  Let pain be your guide so you don't do too much.     SLEEPING / RESTING Sleeping and resting should be in the jack-knife or bent forward position with your head elevated.  This will help reduce pulling on your abdominal incision.  You can elevate your head and upper back with a few pillows and place a pillow under your knees.  Avoid stomach sleeping for 3 months.   WORK Everyone returns to work at different times. As a rough guide, most people take 1 - 2 weeks off prior to returning to work. If you need documentation for your job give them to the front staff for processing.  DRIVING Arrange for someone to bring you home from the hospital.  You may be able to drive a few days after surgery but not while taking any narcotics or valium.  This is for your safety as well as others sharing the road with you.  BOWEL MOVEMENTS Constipation can occur after anesthesia and while taking pain medication.  It is important to stay ahead for your comfort.  We recommend taking Milk of Magnesia (2 tablespoons; twice a day) while taking the pain pills.  MEDICATIONS (you may receive and should be started after surgery) At  your preoperative visit for you history and physical you were given the following medications:  Antibiotic: Start this medication when you get home and take according to the instructions on the bottle. Zofran 4 mg:  This is to treat nausea and vomiting.  You can take this every 6 hours as needed and only if needed. Norco (hydrocodone/acetaminophen) 5/325 mg:  This is only to be used after you have taken the Motrin or the Tylenol. Every 8 hours as needed.  Over the counter Medication to take: Ibuprofen (Motrin) 600 mg:  Take this every 6 hours.  If you have additional pain then take 500 mg of the Tylenol.  Only take the Norco after you have tried these two. MiraLAX or stool softener of choice: Take this according to the bottle if you take the [place] Call your surgeon's office if any of the following occur:  Fever 101 degrees F or greater  Excessive bleeding or fluid from the incision site.  Pain that increases over time without aid from the medications  Redness, warmth, or pus draining from incision sites  Persistent nausea or inability to take in liquids  Severe misshapen area that underwent the operation.

## 2022-06-11 ENCOUNTER — Encounter (HOSPITAL_BASED_OUTPATIENT_CLINIC_OR_DEPARTMENT_OTHER): Payer: Self-pay | Admitting: Plastic Surgery

## 2022-06-12 LAB — SURGICAL PATHOLOGY

## 2022-06-18 ENCOUNTER — Other Ambulatory Visit: Payer: Self-pay

## 2022-06-18 ENCOUNTER — Telehealth: Payer: Self-pay

## 2022-06-18 DIAGNOSIS — E039 Hypothyroidism, unspecified: Secondary | ICD-10-CM

## 2022-06-18 DIAGNOSIS — I10 Essential (primary) hypertension: Secondary | ICD-10-CM

## 2022-06-18 MED ORDER — ATORVASTATIN CALCIUM 20 MG PO TABS: 20.0000 mg | ORAL_TABLET | Freq: Every day | ORAL | 1 refills | Status: DC

## 2022-06-18 MED ORDER — LEVOTHYROXINE SODIUM 150 MCG PO TABS: 150.0000 ug | ORAL_TABLET | Freq: Every day | ORAL | 1 refills | Status: DC

## 2022-06-18 MED ORDER — SPIRONOLACTONE 50 MG PO TABS: 50.0000 mg | ORAL_TABLET | Freq: Every day | ORAL | 1 refills | Status: DC

## 2022-06-18 NOTE — Progress Notes (Signed)
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  Patient underwent panniculectomy with Dr. Marla Roe on 10/20/2021.  Patient presented to the clinic in March 2023 complaining of fullness in her abdomen.  Dark bloody drainage was aspirated from the abdomen. Patient underwent ultrasound of the abdomen and was found to have fluid collections.  Patient then underwent excision of seroma/hematoma capsules of the abdomen with Dr. Marla Roe on 06/10/2022.  Patient presents for postoperative follow-up.  Today, patient reports she is doing really well.  She states the firmness that was present in her abdomen before surgery is now gone.  She states the drain has been putting out approximately 75 to 100 cc of drainage per day.  She denies any fevers or chills.  She denies any other concerns today.  She reports she is voiding and having bowel movements without issue.  Chaperone present on exam.  On exam, patient is sitting upright in no acute distress.  Abdomen is soft.  There is no ecchymosis or erythema noted.  There are no cellulitic changes to the skin noted.  The honeycomb dressings are in place and are clean dry and intact.  Drain is in place and functioning to the left side.  There is approximately 50 cc of serosanguineous drainage in the bulb.  Discussed with patient that she should continue abdominal binder for compression.  Discussed with patient that she should avoid strenuous activities.  Discussed with patient that she should continue to monitor her drain.  Discussed with patient that if the color of the drainage would return to bright red she should let us know.  Also discussed with patient that if she has issues with her drain to let us know.  Discussed with patient that if she has any questions or concerns to call us.  Patient to follow-up in 2 weeks.

## 2022-06-18 NOTE — Chronic Care Management (AMB) (Signed)
Chronic Care Management Pharmacy Assistant   Name: Nancy [name]  MRN: [id number] DOB: Aug 18, 1951   Reason for Encounter: Medication Review/ Medication Coordination  Recent office visits:  None  Recent consult visits:  06-10-2022 Wallace Going, DO (Plastic surgery). EXCISION ABDOMINAL WALL SEROMA procedure completed.  06-05-2022 Wallace Going, DO (Plastic surgery) Pre admission testing.  06-02-2022 Dillingham, Loel Lofty, DO (Plastic surgery). Initial visit for Abdominal wall seroma.  05-07-2022 Scheeler, Carola Rhine, PA-C  (Plastic surgery).  telephone visit to discuss ultrasound to evaluate her abdomen for concern of fluid collection versus hematoma after panniculectomy on 10/20/2021.  04-23-2022 Scheeler, Carola Rhine, PA-C  (Plastic surgery). follow-up after panniculectomy. [place] FNA SOFT TISSUE ordered and US Abdomen Complete performed.  03-31-2022 Scheeler, Carola Rhine, PA-C (Plastic surgery). STOP STRATA TRIZ gel.  Hospital visits:  None in previous 6 months  Medications: Outpatient Encounter Medications as of 06/18/2022  Medication Sig   atorvastatin (LIPITOR) 20 MG tablet Take 1 tablet (20 mg total) by mouth daily.   azelastine (OPTIVAR) 0.05 % ophthalmic solution INSTILL ONE DROP IN [facility] EYE TWICE DAILY Strength: 0.05 %   blood glucose meter kit and supplies Dispense based on patient and insurance preference. Use up to four times daily as directed. (FOR ICD-10 E10.9, E11.9).   Blood Glucose Monitoring Suppl (ACCU-CHEK GUIDE) w/Device KIT 1 Device by Does not apply route daily.   Blood Pressure Monitor KIT USE TO CHECK BLOOD PRESSURE 110   Cholecalciferol 125 MCG (5000 UT) TABS Take 5,000 Units by mouth daily.   clobetasol (TEMOVATE) 0.05 % external solution Apply to scalp nightly   clobetasol ointment (TEMOVATE) 0.05 % Apply to elbows knees and back behind ears nightly   desoximetasone (TOPICORT) 0.25 % cream Apply to skin daily safe to use on face and skin folds    Flaxseed, Linseed, (FLAXSEED OIL) 1000 MG CAPS Take by mouth.   GLUCOSAMINE CHONDROITIN COMPLX PO Take 1 capsule by mouth daily. 1500 mg/1200 mg   Lactobacillus (PROBIOTIC ACIDOPHILUS PO) Take 0.5 mg by mouth daily.   Lancets (ONETOUCH ULTRASOFT) lancets USE TO check blood sugar DAILY   levothyroxine (SYNTHROID) 150 MCG tablet Take 1 tablet (150 mcg total) by mouth daily before breakfast.   Magnesium 250 MG TABS Take 250 mg by mouth daily.   Multiple Vitamins-Minerals (MULTIVITAMIN WOMEN 50+) TABS Take 1 tablet by mouth daily.   ondansetron (ZOFRAN) 4 MG tablet Take 1 tablet (4 mg total) by mouth every 8 (eight) hours as needed for up to 20 doses for nausea or vomiting.   ONETOUCH ULTRA test strip USE AS INSTRUCTED   spironolactone (ALDACTONE) 50 MG tablet Take 1 tablet (50 mg total) by mouth daily.   traMADol (ULTRAM) 50 MG tablet Take 1 tablet (50 mg total) by mouth every 6 (six) hours as needed for up to 20 doses.   Turmeric 500 MG TABS Take 1,000 mg by mouth.   No facility-administered encounter medications on file as of 06/18/2022.  Reviewed chart for medication changes ahead of medication coordination call.  No hospital visits since last care coordination call/Pharmacist visit.   No medication changes indicated  BP Readings from Last 3 Encounters:  06/10/22 135/65  02/18/22 132/74  11/19/21 112/74    Lab Results  Component Value Date   HGBA1C 5.6 02/18/2022     Patient obtains medications through Adherence Packaging  90 Days   Last adherence delivery included:  Onetouch test strips- Use as directed Atorvastatin 20 mg - 1 tablet  daily at bedtime Levothyroxine 150 mcg- 1 tablet daily before breakfast Spironolactone 50 mg- 1 tablet daily at breakfast Azelastine drops 0.05%- 1 drop in each eye twice daily  Patient declined (meds) last delivery: None  Patient is due for next adherence delivery on: 07-01-2022  Called patient and reviewed medications and coordinated  delivery.  This delivery to include: Onetouch test strips- Use as directed Atorvastatin 20 mg - 1 tablet daily at bedtime Levothyroxine 150 mcg- 1 tablet daily before breakfast Spironolactone 50 mg- 1 tablet daily at breakfast Azelastine drops 0.05%- 1 drop in each eye twice daily Lancets  No acute/short fill needed  Patient declined the following medications: None  Patient needs refills for: Sent to [facility] Spironolactone  Atorvastatin Levothyroxine   Confirmed delivery date of 07-01-2022 advised patient that pharmacy will contact them the morning of delivery.   [place] Pharmacist Assistant [phone]

## 2022-06-19 ENCOUNTER — Ambulatory Visit (INDEPENDENT_AMBULATORY_CARE_PROVIDER_SITE_OTHER): Payer: Medicare HMO | Admitting: Student

## 2022-06-19 DIAGNOSIS — S301XXD Contusion of abdominal wall, subsequent encounter: Secondary | ICD-10-CM

## 2022-06-19 DIAGNOSIS — M793 Panniculitis, unspecified: Secondary | ICD-10-CM

## 2022-06-21 IMAGING — MG DIGITAL SCREENING BILAT W/ CAD
5 series · 5 of 5 positions shown · non-contrast
Comparison: Previous exam(s).

CLINICAL DATA: Screening.

EXAM:
DIGITAL SCREENING BILATERAL MAMMOGRAM WITH CAD
TECHNIQUE: Bilateral screening digital craniocaudal and mediolateral oblique
mammograms were obtained. The images were evaluated with
computer-aided detection.

[L CC]
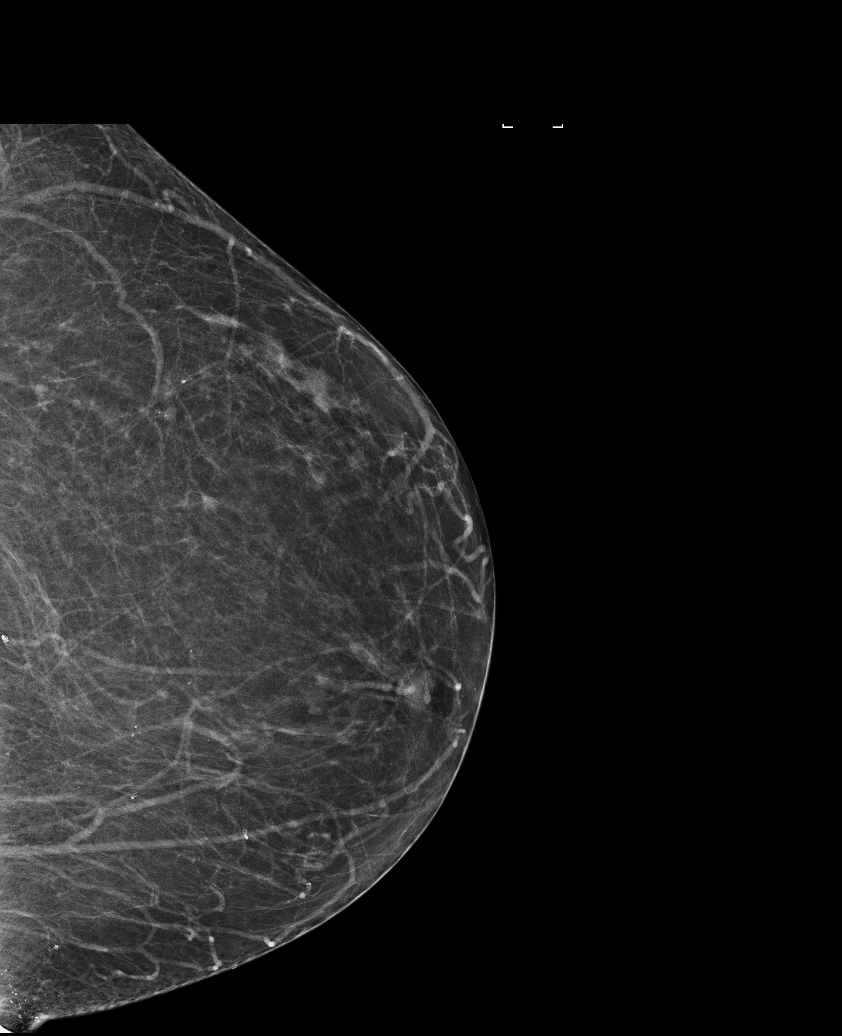

[R CV]
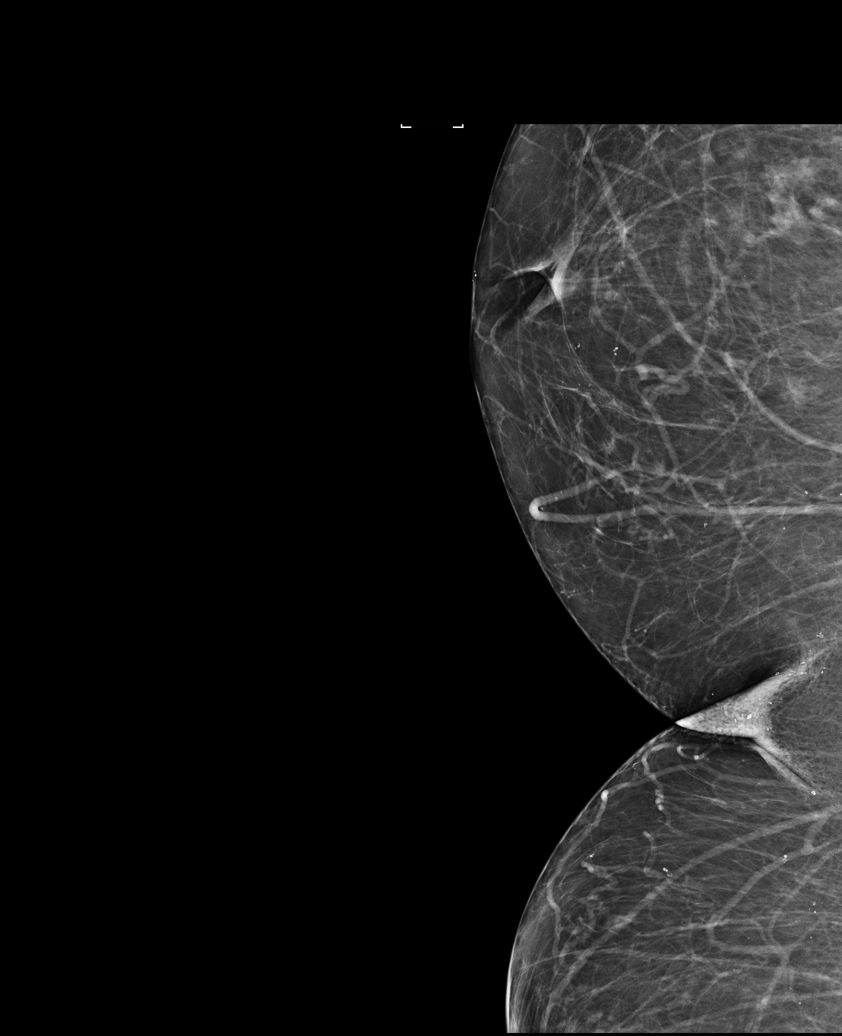

[L MLO]
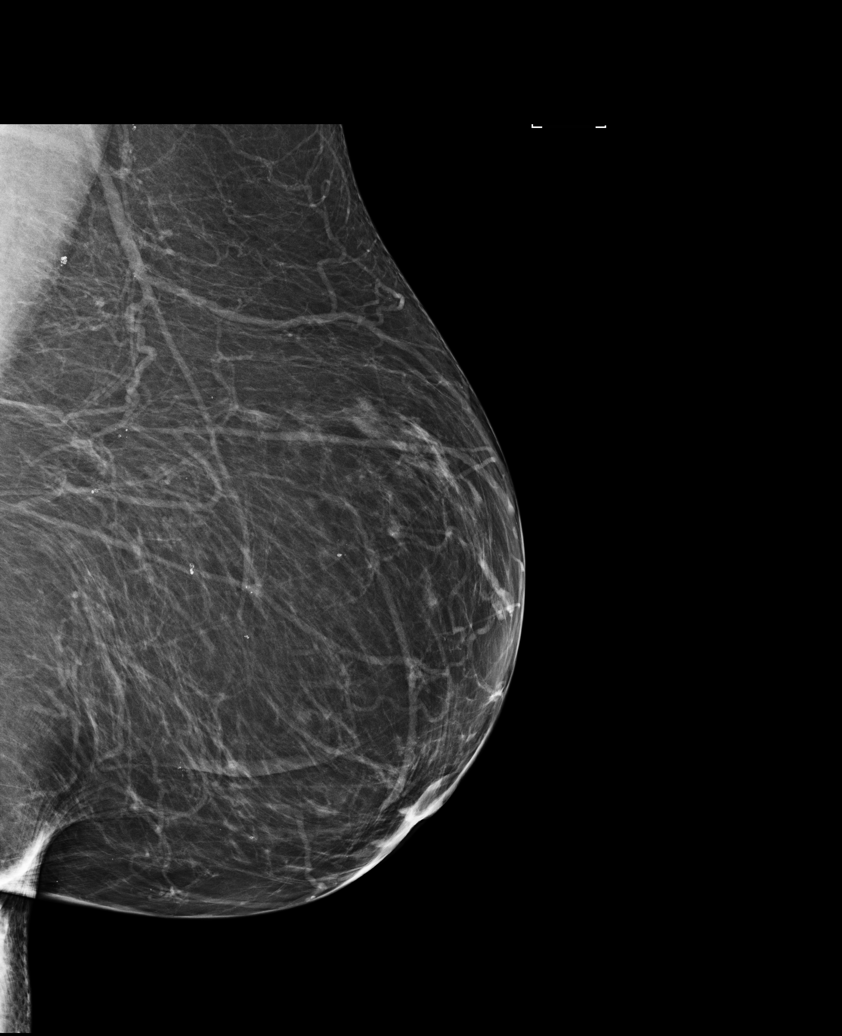

[R CC]
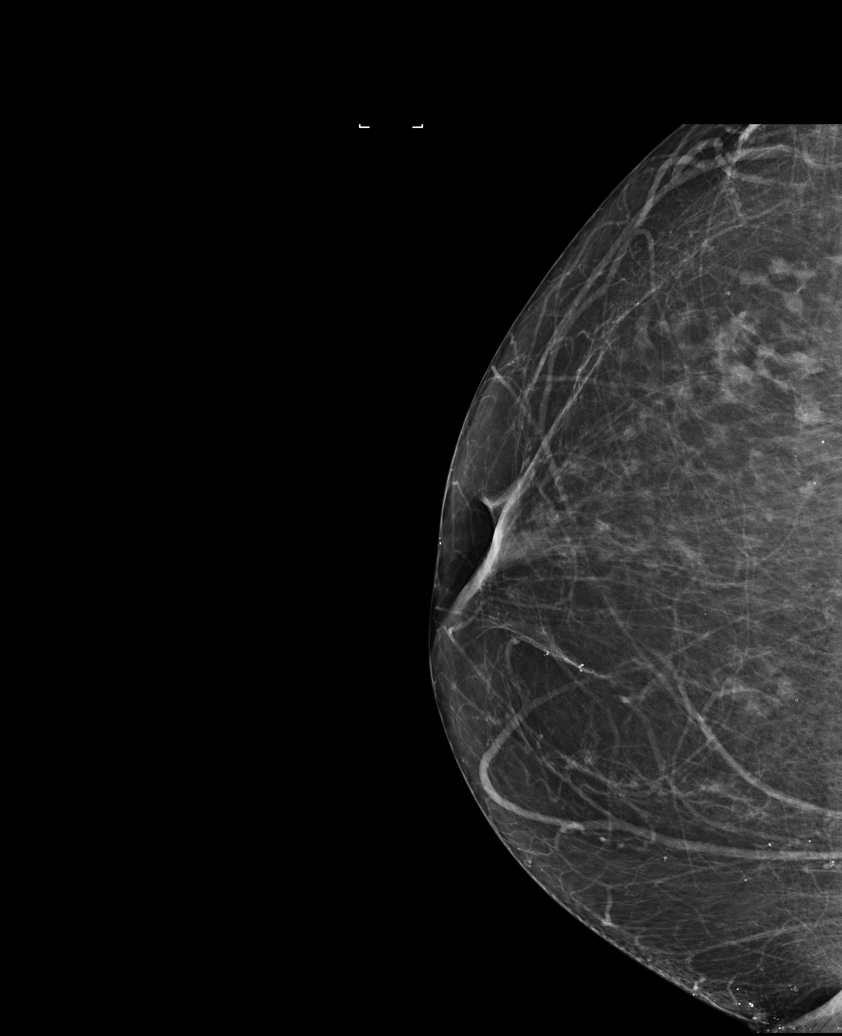

[R MLO]
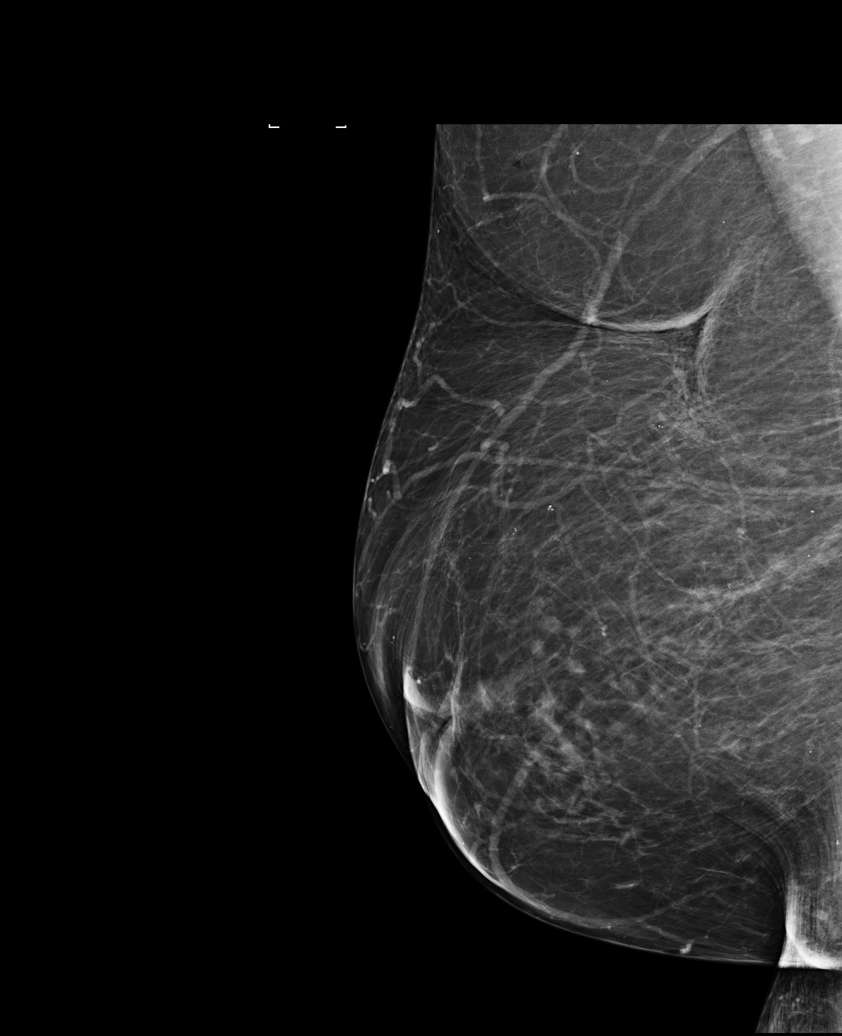

[5 of 5 positions shown; findings below may reference images not displayed]

ACR Breast Density Category b: There are scattered areas of
fibroglandular density.
FINDINGS: There are no findings suspicious for malignancy. The images were
evaluated with computer-aided detection.
IMPRESSION: No mammographic evidence of malignancy. A result letter of this
screening mammogram will be mailed directly to the patient.

RECOMMENDATION:
Screening mammogram in one year. (Code:[id number])

BI-RADS CATEGORY  1: Negative.

## 2022-06-22 ENCOUNTER — Telehealth: Payer: Self-pay | Admitting: Student

## 2022-06-22 NOTE — Telephone Encounter (Signed)
Patient called to follow up on issue with constipation. Patient stated the [place] isn't giving her any relief and is requesting an Rx for Linzess.   Pharmacy confirmed as [facility] on [address].  Requesting call back before the end of business today. Please advise at [phone].

## 2022-06-22 NOTE — Telephone Encounter (Signed)
Pt called back to let us know that she the prior issue of constipation has resolved itself no further actions is needed.  Pt stated that she will call back if it should reoccur.

## 2022-06-30 ENCOUNTER — Ambulatory Visit (INDEPENDENT_AMBULATORY_CARE_PROVIDER_SITE_OTHER): Payer: Medicare HMO | Admitting: Student

## 2022-06-30 ENCOUNTER — Encounter: Payer: Self-pay | Admitting: Student

## 2022-06-30 DIAGNOSIS — Z9889 Other specified postprocedural states: Secondary | ICD-10-CM

## 2022-06-30 DIAGNOSIS — S301XXD Contusion of abdominal wall, subsequent encounter: Secondary | ICD-10-CM

## 2022-06-30 NOTE — Progress Notes (Signed)
Patient is a 71 year old female with history of panniculitis.  She underwent panniculectomy with Dr. Marla Roe on 10/20/2021.  She was later found to have fluid collections in her abdomen several months later.  Patient most recently underwent excision of seroma/hematoma capsules of the abdomen with Dr. Marla Roe on 06/10/2022.  Patient presents for postoperative follow-up.  Patient was last seen in the clinic in 06/19/2022.  At this visit, she was doing well.  On exam, abdomen was soft and honeycomb dressings to inferior abdominal incision were clean dry and intact.  The drain was functioning in place to the left abdomen.  Today, patient reports she is doing well.  She states that she feels the drainage has increased a little bit.  She states that she is increased her activity a little bit by standing in the kitchen a little more.  She reports the drainage has been between 100 to 150 cc /day for the past few days.  She states that the color has been lightening up. She denies any fevers or chills.  She reports she is voiding and having bowel movements without issue.  Chaperone present on exam.  On exam abdomen is soft.  There are no significant fluid collections palpated on exam.  Honeycomb dressings were removed.  Incision to right lateral abdomen was intact with Steri-Strips.  No incision to left abdomen was intact with Steri-Strips.  The Steri-Strips were mildly moist.  These were removed.  Incision appears to be healing well, there are no wounds noted.  There is no erythema or drainage noted from the incision.  New Steri-Strips were applied over the incision.  Drain on the left side is in place and in functioning.  There is approximately 100 cc of serous drainage in the bulb.  Discussed with patient to continue with compression with the abdominal binder.  Discussed with patient that we will leave the drain in, and to continue to monitor the output.  Discussed with patient to avoid strenuous activities or  heavy lifting.  Instructed patient to call if the drainage output increases, if the color changes or gets darker, if she develops fevers or chills, or if she has any other concerns or questions.  Patient to follow-up next week.

## 2022-07-02 ENCOUNTER — Telehealth: Payer: Self-pay

## 2022-07-02 NOTE — Telephone Encounter (Signed)
Pt had an excision of an abdominal wall seroma on 6/21. Pt states she feels as if her drain is coming out. She says that it looks as if the sutures are going further and further down the tube. Yesterday she drained 110 cc and since 1:00 am this morning until now, she has drained 80 cc. I informed her that it should be sutured to her skin and not fall out, but to please send a picture. Since you are here today I am having her send it in to [place] for you to see, just in case.

## 2022-07-03 ENCOUNTER — Ambulatory Visit (INDEPENDENT_AMBULATORY_CARE_PROVIDER_SITE_OTHER): Payer: Medicare HMO | Admitting: Surgical

## 2022-07-03 DIAGNOSIS — L7634 Postprocedural seroma of skin and subcutaneous tissue following other procedure: Secondary | ICD-10-CM

## 2022-07-03 DIAGNOSIS — S301XXD Contusion of abdominal wall, subsequent encounter: Secondary | ICD-10-CM

## 2022-07-03 NOTE — Progress Notes (Signed)
Patient is a 71 year old female here for follow-up after excision of abdominal wall seroma with Dr. Marla Roe on 06/10/2022.  She presents today for concerns of her left JP drain falling out.  She reports that the suture seems to be much looser than it was prior.  She is otherwise feeling well, feels as if she is healing well.  Chaperone present on exam On exam abdomen is nontender, soft, no fluid collections noted with palpation.  No erythema.  Incision is intact, healing well.  Left JP drain is in place, suture is still present and secured to the skin.   We reinforced patient's JP drain with adhesive dressings, recommend changing the adhesive dressing and resecured daily as needed.  I do not feel as if an additional suture was necessary given the original suture was still present.  She is still having a lot of output, 120 cc yesterday so we will leave this in place and have her follow-up next week at her already scheduled appointment.  No signs of infection on exam.  Call with questions or concerns.

## 2022-07-07 ENCOUNTER — Ambulatory Visit (INDEPENDENT_AMBULATORY_CARE_PROVIDER_SITE_OTHER): Payer: Medicare HMO | Admitting: Physician Assistant

## 2022-07-07 DIAGNOSIS — S301XXD Contusion of abdominal wall, subsequent encounter: Secondary | ICD-10-CM

## 2022-07-07 DIAGNOSIS — Z9889 Other specified postprocedural states: Secondary | ICD-10-CM

## 2022-07-07 NOTE — Progress Notes (Signed)
This is a 71 year old female seen in our office for follow-up evaluation status post excision of abdominal wall seroma with Dr. Tedra Coupe him on 06/10/2022.  The patient was most recently seen in our office on 07/03/2022.  She notes she had some issues with her drain being loose.  Since her last office visit she notes she has been doing well, she denies any new areas of firmness, no discharge from the drain site, no surrounding redness warmth fever nausea or vomiting.  Over the last several days she has had approximately 100 cc of serous appearing output.  On exam the abdomen soft nontender, no fluid collections noted with palpation.  No erythema, incision is intact with routine healing.  Left JP drain is in place the sutures still present in the skin although somewhat lax to the tubing.  I used an additional stitch to adhere the already present stitch to the tubing so that there was less laxity.  The tubing was secured with a Mepilex border.  Given the continued output we would like to see the patient for a follow-up evaluation in our office on Monday.  The patient will call on Friday to give [place] an update on her drainage, she will call immediately if she develops any new or worsening signs or symptoms.  The patient verbalized understanding and agreement to today's plan had no further questions or concerns at today's visit.

## 2022-07-10 ENCOUNTER — Ambulatory Visit (INDEPENDENT_AMBULATORY_CARE_PROVIDER_SITE_OTHER): Payer: Medicare HMO | Admitting: Physician Assistant

## 2022-07-10 DIAGNOSIS — S301XXD Contusion of abdominal wall, subsequent encounter: Secondary | ICD-10-CM

## 2022-07-10 DIAGNOSIS — L7634 Postprocedural seroma of skin and subcutaneous tissue following other procedure: Secondary | ICD-10-CM

## 2022-07-10 NOTE — Progress Notes (Signed)
The patient gave consent to have this visit done by telemedicine / virtual visit, two identifiers were used to identify patient. This is also consent for access the chart and treat the patient via this visit. The patient is located in [facility].  I, the provider, am at the office.  We spent 5 minutes together for the visit.    This is a 71 year old female who was seen in our office on 07/07/2022 for follow-up evaluation status post excision of abdominal wall seroma by Dr. Marla Roe on 06/10/2022.  At her last visit she had been doing well, she denied any new areas of firmness no discharge from the drain site no surrounding redness warmth fever nausea or vomiting.  I did secure down her drain.  I had a phone chat with the patient today just to reassess her to make sure she is doing well.  She notes she has been doing well, she continues to have output from her drain, she denies any surrounding redness, fever or warmth.  The patient is scheduled to follow-up in our office next week, we will reassess the drain at that time.  She understands that if she develops any new or worsening signs or symptoms she would call our office.  She verbalized understanding and agreement to this plan had no further questions or concerns.

## 2022-07-13 ENCOUNTER — Ambulatory Visit (INDEPENDENT_AMBULATORY_CARE_PROVIDER_SITE_OTHER): Payer: Medicare HMO | Admitting: Physician Assistant

## 2022-07-13 DIAGNOSIS — S301XXD Contusion of abdominal wall, subsequent encounter: Secondary | ICD-10-CM

## 2022-07-13 DIAGNOSIS — L7634 Postprocedural seroma of skin and subcutaneous tissue following other procedure: Secondary | ICD-10-CM

## 2022-07-13 NOTE — Progress Notes (Signed)
This is a 71 year old female seen in office for follow-up evaluation status post excision of the abdominal wall seroma by Dr. Marla Roe on 06/10/2022.  She has been doing well, she denies any signs of infection including redness, warmth, discharge, fever, nausea or vomiting.  She was last seen on 07/07/2022.  At that time her drain continued to have significant output.  Over the last 3 days she has had 100, 110, and most recently over the last 24 hours 90 cc of serous output from her drain.  Overall she is doing well, her incision is clean dry and intact, her abdomen is soft nontender with no fluctuance or fluid collections.  I will tentatively schedule her in 2 days for follow-up in our office and we will reach out to Dr. Marla Roe to determine timeframe for drain removal.  She understands to call with any questions or concerns.

## 2022-07-20 ENCOUNTER — Ambulatory Visit (INDEPENDENT_AMBULATORY_CARE_PROVIDER_SITE_OTHER): Payer: Medicare HMO | Admitting: Physician Assistant

## 2022-07-20 DIAGNOSIS — S301XXD Contusion of abdominal wall, subsequent encounter: Secondary | ICD-10-CM

## 2022-07-20 DIAGNOSIS — L7634 Postprocedural seroma of skin and subcutaneous tissue following other procedure: Secondary | ICD-10-CM

## 2022-07-20 NOTE — Progress Notes (Signed)
This is a 71 year old female seen in our office for follow-up evaluation status post excision of abdominal wall seroma by Dr. Marla Roe on 06/10/2022.  The patient reports that she has been doing well, she denies any signs of infection including redness, warmth, discharge, fever, nausea or vomiting.  She was most recently seen in our office on 07/13/2022.  At that time she continued to have significant serous output from her drain.  She notes in the meantime she has continued to have significant output measuring 130 cc, 120 cc over the last 2 days.  She denies any pain.  She notes that her drain stitch did pull out from her skin but has kept the drain in with tape.  On exam her abdominal incision is clean dry and intact with routine healing, no signs of infection.  Her drain is in place but her drain stitch has been pulled from the skin.  I cleansed the area thoroughly with chlorhexidine, anesthetized the area with 2 cc of lidocaine, and placed a 3-0 Prolene in the skin and secured it to the drain.  This was covered with gauze and tape.  The drain site is clean dry with no redness discharge or warmth.  Her abdomen is soft nontender with no fluid collections.    Given her continued significant serous output from her drain we will continue to watch her closely.  She will follow-up in our office on Friday, I have instructed her to reach out to our office on Friday if her drain continues to put out similar output, in that case we will have her follow-up on Monday.  She understands to watch for any signs of infection including abdominal pain, swelling, discharge, warmth, fever, or any other concerning signs or symptoms.  She will reach out immediately if she develops any of those.  The patient verbalized understanding and agreement to today's plan had no further questions or concerns at today's visit.

## 2022-07-22 NOTE — Progress Notes (Deleted)
   Referring Provider [name], [address] [place],  [place] [address]   CC: No chief complaint on file.     Nancy [name] is an 71 y.o. female.  HPI: ***  Review of Systems General: ***  Physical Exam    06/10/2022    5:16 PM 06/10/2022    4:30 PM 06/10/2022    4:15 PM  Vitals with BMI  Systolic 500 164 290  Diastolic 65 54 60  Pulse 62  68    General:  No acute distress,  Alert and oriented, Non-Toxic, Normal speech and affect ***   Assessment/Plan ***  Stevie Kern Bronwen Pendergraft 07/22/2022, 9:57 AM

## 2022-07-24 ENCOUNTER — Ambulatory Visit: Payer: Medicare HMO | Admitting: Physician Assistant

## 2022-07-27 ENCOUNTER — Ambulatory Visit (INDEPENDENT_AMBULATORY_CARE_PROVIDER_SITE_OTHER): Payer: Medicare HMO | Admitting: Physician Assistant

## 2022-07-27 DIAGNOSIS — S301XXD Contusion of abdominal wall, subsequent encounter: Secondary | ICD-10-CM

## 2022-07-27 DIAGNOSIS — L7634 Postprocedural seroma of skin and subcutaneous tissue following other procedure: Secondary | ICD-10-CM

## 2022-07-27 NOTE — Progress Notes (Signed)
This is a very pleasant 71 year old female seen in our office for follow-up evaluation status post excision of abdominal wall seroma by Dr. Marla Roe on 06/10/2022.  The patient was most recently seen in our office on 07/20/2022.  At that time she had been doing well, she had continued to have serous drainage from her drain.  Since her last office visit she has been doing very well, she denies any infectious symptoms including fever, nausea, vomiting or discharge.  Her drain has put out 110, 90, 60 cc of serous output over the last 3 days.  On exam her abdominal wall incision is clean dry and intact with routine healing, no signs of infection.  Her drain is in place with no surrounding redness, discharge, warmth or tenderness.  She does have serous drainage in her JP bulb.  Her abdomen is soft nontender with no fluid collections.  Overall the patient is making progress, her drain has consistently slowed down on its output.  She has no signs of infection, no signs of fluid collections.  Given she is still putting out a significant amount of fluid we will hold off on pulling out her drain.  The patient will reach out to our office on Thursday for an update on her drainage output and potential office visit on Friday.  She understands to reach out to our office immediately if she develops any new or worsening signs or symptoms, she verbalized understanding and agreement to today's plan had no further questions or concerns at today's visit.  A chaperone was present throughout the exam.

## 2022-07-31 ENCOUNTER — Ambulatory Visit (INDEPENDENT_AMBULATORY_CARE_PROVIDER_SITE_OTHER): Payer: Self-pay | Admitting: Physician Assistant

## 2022-07-31 DIAGNOSIS — S301XXD Contusion of abdominal wall, subsequent encounter: Secondary | ICD-10-CM

## 2022-07-31 DIAGNOSIS — L7634 Postprocedural seroma of skin and subcutaneous tissue following other procedure: Secondary | ICD-10-CM

## 2022-07-31 NOTE — Progress Notes (Signed)
This is a very pleasant 71 year old female seen in our office for follow-up evaluation status post excision of abdominal wall seroma by Dr. Marla Roe on 06/10/2022.  The patient was most recently seen in our office on 07/20/2022.  At that time she had been doing well, she had continued to have serous drainage from her drain.  Since her last office visit she has been doing well.  She denies any infectious symptoms.  She has put out 85/115/70 cc/day over the last 3 days.  On exam her abdominal vision is clean dry and intact with routine healing no signs of infection, her drain is in place with no surrounding redness, discharge, or warmth.  She does have serous drainage in her JP bulb.  Abdomen soft nontender with no fluid collections  Patient is doing very well today, she continues to have decreased output from her drain.  She will reach out to our office on Monday and let us know how much output she has been having I do anticipate that we will be able to remove the drain shortly.  The patient again is given strict return precautions in the event she develops any new or worsening signs or symptoms, she verbalized understanding and agreement to today's plan had no further questions or concerns.

## 2022-08-04 ENCOUNTER — Telehealth: Payer: Self-pay | Admitting: Physician Assistant

## 2022-08-04 NOTE — Telephone Encounter (Signed)
Mrs. Oppedisano would like you to call her when you are available.  Fluid output has increased significantly (13th-625, 14th-180) and she is very swollen

## 2022-08-06 ENCOUNTER — Ambulatory Visit (INDEPENDENT_AMBULATORY_CARE_PROVIDER_SITE_OTHER): Payer: Medicare HMO | Admitting: Physician Assistant

## 2022-08-06 DIAGNOSIS — L7634 Postprocedural seroma of skin and subcutaneous tissue following other procedure: Secondary | ICD-10-CM

## 2022-08-06 DIAGNOSIS — S301XXD Contusion of abdominal wall, subsequent encounter: Secondary | ICD-10-CM

## 2022-08-06 NOTE — Progress Notes (Signed)
This is a very pleasant 71 year old female seen in our office for follow-up evaluation status post excision of abdominal wall seroma by Dr. Marla Roe on 06/10/2022.    At her last office visit on 07/31/2022 she was doing well, she did note ongoing output from her drain, no infectious symptoms, no significant pain.  Since her last office visit she has had somewhat significant increase in the output from her drain noting that on 08/02/2022 she had 625 cc, 08/03/2012 180, 08/04/2022 300, and yesterday 08/05/2022 220 cc.  She notes this is somewhat same consistent silly and color as previous.  She continues to deny any infectious abdomens including fever chills nausea vomiting.  She denies any issues at her drain site.   On exam her abdomen is soft with no focal tenderness, swelling or fluid collections.  Her abdominal incision is clean dry and intact with no wounds or issues.  Her JP bulb as a large amount of serous fluid.  Unfortunately her drain has increased its output over the last several days, she has not emptied her drain today but notes that it does appear to be slowing down.  I had a discussion with Dr. Marla Roe we were initially planning on pulling her drain and watching closely with a 2-week abdominal ultrasound, but given the significant increase in output we have elected to maintain the drain at this time.  The patient does understand the risks of maintaining a drain including infection, but given the significant volume the risk of fluid collection at this time is high.  I have discussed with the patient will continue to maintain the drain, she will follow-up in our office on Monday, if her drain does decrease back to its somewhat baseline output we will pull the drain at that time.  The patient understands to reach out to our office immediately should develops any new or worsening signs or symptoms.  She verbalized understanding and agreement to today's plan had no further questions or  concerns.  Chaperone was present for my exam.

## 2022-08-07 ENCOUNTER — Telehealth: Payer: Medicare HMO

## 2022-08-10 ENCOUNTER — Telehealth: Payer: Self-pay | Admitting: Physician Assistant

## 2022-08-10 ENCOUNTER — Ambulatory Visit: Payer: Medicare HMO | Admitting: Physician Assistant

## 2022-08-10 NOTE — Telephone Encounter (Signed)
I spoke with her and put her on my schedule for late this week, thanks

## 2022-08-10 NOTE — Telephone Encounter (Signed)
Patient called to follow up on appt scheduled for today; stated provider told her during appt on 8/17 she didn't need to come today if reading was over 100, which it was. Patient awaiting call back to find out when she needs to come in again. Please advise at [phone].

## 2022-08-10 NOTE — Telephone Encounter (Signed)
Danicia Terhaar called and wanted me to give you drainage amount to see if you still wanted her to come in: 8/16-220, 8/17-200, 8/18-160,8/19-130, 8/20-250 and today 8/21 is 60.  She wanted to see if she still need to come in today due to the amount of drainage per Merry Proud and hers conversation.//kn

## 2022-08-14 ENCOUNTER — Encounter: Payer: Self-pay | Admitting: Physician Assistant

## 2022-08-14 ENCOUNTER — Ambulatory Visit (INDEPENDENT_AMBULATORY_CARE_PROVIDER_SITE_OTHER): Payer: Medicare HMO | Admitting: Physician Assistant

## 2022-08-14 DIAGNOSIS — M793 Panniculitis, unspecified: Secondary | ICD-10-CM

## 2022-08-14 DIAGNOSIS — L7634 Postprocedural seroma of skin and subcutaneous tissue following other procedure: Secondary | ICD-10-CM

## 2022-08-14 NOTE — Progress Notes (Signed)
  This is a very pleasant 71 year old female seen in our office for follow-up evaluation status post excision of abdominal wall seroma by Dr. Marla Roe on 06/10/2022.  At her last office visit on 08/06/2022 she was doing very well.  Her drain had increased in output, she had no infectious symptoms at that time.  Since her last office visit she notes she has been doing well.  She denies any infectious symptoms including fever, abdominal pain, nausea vomiting or increased drain output.  Her drain output has decreased noting that over the last day she has had minimal output, yesterday she only had 60 cc total.   On exam her abdomen is soft no focal tenderness, no swelling or fluid collections.  Her abdominal incision is clean dry and intact with no wounds or issues.  Her JP bulb has a very small amount of serous fluid.  Overall the patient is doing well, fortunately her drain output has decreased although it is still putting out serous fluid.  The patient has had this drained for several months at this point, we had discussed removing it but then her drain output increased.  I had a lengthy discussion with the patient today on risks and benefits of leaving the drain in versus taking it out.  We do feel at this point that continuing the drain does present ongoing risk of infection which would be a greater risk than [place] taking it out at this point.  Through shared medical decision making we decided that pulling the drain would be in her best interest today.  I did remove this without difficulty.  I have ordered a abdominal ultrasound to be completed in 2 weeks, and also have scheduled the patient follow-up in our office next week.  She does understand that if she develops any new or worsening signs or symptoms including fever, abdominal pain, nausea or vomiting that she needs to reach out to [place] immediately.  The patient verbalized understanding and agreement to today's plan had no further questions or concerns.  A  chaperone was present throughout my entire evaluation today.

## 2022-08-21 ENCOUNTER — Ambulatory Visit (INDEPENDENT_AMBULATORY_CARE_PROVIDER_SITE_OTHER): Payer: Medicare HMO | Admitting: Physician Assistant

## 2022-08-21 DIAGNOSIS — M793 Panniculitis, unspecified: Secondary | ICD-10-CM

## 2022-08-21 DIAGNOSIS — L7634 Postprocedural seroma of skin and subcutaneous tissue following other procedure: Secondary | ICD-10-CM

## 2022-08-21 DIAGNOSIS — S301XXD Contusion of abdominal wall, subsequent encounter: Secondary | ICD-10-CM

## 2022-08-21 NOTE — Progress Notes (Signed)
This is a pleasant 71 year old female seen in our office for follow-up evaluation status post excision of abdominal wall seroma by Dr. Marla Roe on 06/10/2022.  At her last office visit on 08/06/2022 she was doing well.  Her drain had come down to the lowest it had been in the last several weeks.  After lengthy discussion we eventually removed the drain at that visit.  Since her last visit she notes she has been doing well.  She does feel some increased fullness in the abdomen but no focal fluid collections, she does note it feels more full along the left of midline.  She denies any infectious symptoms including fever chills nausea or vomiting.  Chaperone present, on exam her abdominal incision is clean dry and intact with no surrounding redness warmth to touch.  Her abdomen is soft and nontender, she does have some minimal fullness in the abdomen but no obvious fluid collections.  Overall think the patient is doing well, given the ongoing output she had postoperatively and thus removing the drain we do want we will watch her closely.  She has a scheduled ultrasound for next Wednesday.  We would like radiology to place a drain if there is a significant amount of fluid collection at the time of her ultrasound.  The patient is amenable to this plan, she will follow-up immediately if she develops any new or worsening signs or symptoms.  She verbalized understanding and agreement to today's plan had no further questions or concerns.

## 2022-08-25 ENCOUNTER — Telehealth (HOSPITAL_COMMUNITY): Payer: Self-pay

## 2022-08-25 ENCOUNTER — Other Ambulatory Visit: Payer: Self-pay | Admitting: Physician Assistant

## 2022-08-25 ENCOUNTER — Other Ambulatory Visit: Payer: Self-pay | Admitting: Radiology

## 2022-08-25 DIAGNOSIS — N179 Acute kidney failure, unspecified: Secondary | ICD-10-CM

## 2022-08-25 DIAGNOSIS — M793 Panniculitis, unspecified: Secondary | ICD-10-CM

## 2022-08-25 NOTE — Telephone Encounter (Signed)
Called to scheduled [place] drain for tomorrow at [facility], no answer, left vm to call back for instructions. AW

## 2022-08-26 ENCOUNTER — Other Ambulatory Visit: Payer: Self-pay

## 2022-08-26 ENCOUNTER — Ambulatory Visit (HOSPITAL_COMMUNITY)
Admission: RE | Admit: 2022-08-26 | Discharge: 2022-08-26 | Disposition: A | Discharge status: Home / Self Care | Payer: Medicare HMO | Source: Ambulatory Visit | Attending: Physician Assistant | Admitting: Physician Assistant

## 2022-08-26 ENCOUNTER — Telehealth: Payer: Self-pay | Admitting: Physician Assistant

## 2022-08-26 ENCOUNTER — Other Ambulatory Visit: Payer: Self-pay | Admitting: Physician Assistant

## 2022-08-26 ENCOUNTER — Encounter (HOSPITAL_COMMUNITY): Payer: Self-pay

## 2022-08-26 DIAGNOSIS — K668 Other specified disorders of peritoneum: Secondary | ICD-10-CM | POA: Diagnosis not present

## 2022-08-26 DIAGNOSIS — N179 Acute kidney failure, unspecified: Secondary | ICD-10-CM | POA: Diagnosis not present

## 2022-08-26 DIAGNOSIS — Z0389 Encounter for observation for other suspected diseases and conditions ruled out: Secondary | ICD-10-CM | POA: Diagnosis not present

## 2022-08-26 DIAGNOSIS — M793 Panniculitis, unspecified: Secondary | ICD-10-CM

## 2022-08-26 LAB — PROTIME-INR
INR: 1 (ref 0.8–1.2)
Prothrombin Time: 13.2 seconds (ref 11.4–15.2)

## 2022-08-26 LAB — CBC
HCT: 41 % (ref 36.0–46.0)
Hemoglobin: 12.9 g/dL (ref 12.0–15.0)
MCH: 28.4 pg (ref 26.0–34.0)
MCHC: 31.5 g/dL (ref 30.0–36.0)
MCV: 90.3 fL (ref 80.0–100.0)
Platelets: 354 10*3/uL (ref 150–400)
RBC: 4.54 MIL/uL (ref 3.87–5.11)
RDW: 14.3 % (ref 11.5–15.5)
WBC: 7.3 10*3/uL (ref 4.0–10.5)
nRBC: 0 % (ref 0.0–0.2)

## 2022-08-26 MED ORDER — FENTANYL CITRATE (PF) 100 MCG/2ML IJ SOLN
INTRAMUSCULAR | Status: AC
  Filled 2022-08-26: qty 2

## 2022-08-26 MED ORDER — MIDAZOLAM HCL 2 MG/2ML IJ SOLN
INTRAMUSCULAR | Status: AC | PRN
  Administered 2022-08-26 (×2): .5 mg via INTRAVENOUS

## 2022-08-26 MED ORDER — SODIUM CHLORIDE 0.9% FLUSH: 5.0000 mL | Freq: Three times a day (TID) | INTRAVENOUS | Status: DC

## 2022-08-26 MED ORDER — LIDOCAINE HCL (PF) 1 % IJ SOLN
INTRAMUSCULAR | Status: AC
  Filled 2022-08-26: qty 30

## 2022-08-26 MED ORDER — SODIUM CHLORIDE 0.9 % IV SOLN
INTRAVENOUS | Status: AC | PRN
  Administered 2022-08-26: 10 mL/h via INTRAVENOUS

## 2022-08-26 MED ORDER — MIDAZOLAM HCL 2 MG/2ML IJ SOLN
INTRAMUSCULAR | Status: AC
  Filled 2022-08-26: qty 2

## 2022-08-26 MED ORDER — FENTANYL CITRATE (PF) 100 MCG/2ML IJ SOLN
INTRAMUSCULAR | Status: AC | PRN
  Administered 2022-08-26 (×2): 25 ug via INTRAVENOUS

## 2022-08-26 MED ORDER — SODIUM CHLORIDE 0.9 % IV SOLN: INTRAVENOUS | Status: DC

## 2022-08-26 NOTE — Procedures (Signed)
Interventional Radiology Procedure Note  Procedure: Placement of seroma drain, anterior abdominal wall.  25F.  ~120cc serous fluid aspirated.   Complications: None Recommendations:  - Ok to shower tomorrow - Do not submerge - Routine drain care - follow up on schedule with surgery   Signed,  Dulcy Fanny. Earleen Newport, DO

## 2022-08-26 NOTE — Consult Note (Signed)
Chief Complaint: Patient was seen in consultation today for  image guided aspiration/possible drainage of abdominal wall fluid collection(s)  Referring Physician(s): Hedges,Jeffrey,PA-C  Supervising Physician: Corrie Mckusick  Patient Status: [facility] - Out-pt  History of Present Illness: Nancy [name] is a 71 y.o. female with PMH sig for RBBB, HLD, HTN, hypothyroidism, prediabetes who underwent panniculectomy with ventral hernia repair on 10/32/22 followed by abd wall seroma excision on 06/10/22. Pt has had previous surgical drain placement into seroma as well as aspiration . She now presents with most recent US study in May of this year showing complex collections  in the right lower and left lower quadrants with apparent solid and cystic components. Given the history of surgery, these could represent fat necrosis or evolving hematoma. She is scheduled today for f/u US abd and possible aspiration/drainage of abd wall fluid collection(s) if needed.   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   Complication of anesthesia    pt states she woke up during a tonsillectomy when she was 28   Hyperlipidemia    Hypertension    Hypothyroidism 1980   Pre-diabetes    Prediabetes    Thyroid disease     Past Surgical History:  Procedure Laterality Date   BREAST SURGERY     CHOLECYSTECTOMY     LIPOMA EXCISION N/A 06/10/2022   Procedure: EXCISION ABDOMINAL WALL SEROMA;  Surgeon: Wallace Going, DO;  Location: [place];  Service: Plastics;  Laterality: N/A;  [place]   PANNICULECTOMY N/A 10/20/2021   Procedure: PANNICULECTOMY;  Surgeon: Wallace Going, DO;  Location: [place];  Service: Plastics;  Laterality: N/A;  3.5 hours   REDUCTION MAMMAPLASTY     TONSILLECTOMY     VENTRAL HERNIA REPAIR N/A 10/20/2021   Procedure: HERNIA REPAIR VENTRAL ADULT WITH MESH;   Surgeon: Coralie Keens, MD;  Location: [place];  Service: General;  Laterality: N/A;    Allergies: Patient has no known allergies.  Medications: Prior to Admission medications   Medication Sig Start Date End Date Taking? Authorizing Provider  atorvastatin (LIPITOR) 20 MG tablet Take 1 tablet (20 mg total) by mouth daily. 06/18/22   [name], FNP  azelastine (OPTIVAR) 0.05 % ophthalmic solution INSTILL ONE DROP IN [facility] EYE TWICE DAILY Strength: 0.05 % 12/23/21   [name], FNP  blood glucose meter kit and supplies Dispense based on patient and insurance preference. Use up to four times daily as directed. (FOR ICD-10 E10.9, E11.9). 07/08/21   [name], FNP  Blood Glucose Monitoring Suppl (ACCU-CHEK GUIDE) w/Device KIT 1 Device by Does not apply route daily. 05/02/20   [name], FNP  Blood Pressure Monitor KIT USE TO CHECK BLOOD PRESSURE 110 07/08/21   [name], FNP  Cholecalciferol 125 MCG (5000 UT) TABS Take 5,000 Units by mouth daily.    [provider]  clobetasol (TEMOVATE) 0.05 % external solution Apply to scalp nightly 07/22/21   Sheffield, Vida Roller R, PA-C  clobetasol ointment (TEMOVATE) 0.05 % Apply to elbows knees and back behind ears nightly 07/22/21   Sheffield, Kelli R, PA-C  desoximetasone (TOPICORT) 0.25 % cream Apply to skin daily safe to use on face and skin folds 07/22/21   Sheffield, Kelli R, PA-C  Flaxseed, Linseed, (FLAXSEED OIL) 1000 MG CAPS Take by mouth.    [provider]  GLUCOSAMINE CHONDROITIN  COMPLX PO Take 1 capsule by mouth daily. 1500 mg/1200 mg    [provider]  Lactobacillus (PROBIOTIC ACIDOPHILUS PO) Take 0.5 mg by mouth daily.    [provider]  Lancets [facility] ULTRASOFT) lancets USE TO check blood sugar DAILY 04/02/22   [name], FNP  levothyroxine (SYNTHROID) 150 MCG tablet Take 1 tablet (150 mcg total) by mouth daily before breakfast. 06/18/22   [name], FNP  Magnesium 250 MG TABS Take 250 mg by  mouth daily.    [provider]  Multiple Vitamins-Minerals (MULTIVITAMIN WOMEN 50+) TABS Take 1 tablet by mouth daily.    [provider]  ondansetron (ZOFRAN) 4 MG tablet Take 1 tablet (4 mg total) by mouth every 8 (eight) hours as needed for up to 20 doses for nausea or vomiting. 06/09/22   Clance Boll, PA-C  [facility] ULTRA test strip USE AS INSTRUCTED 07/16/21   [name], FNP  spironolactone (ALDACTONE) 50 MG tablet Take 1 tablet (50 mg total) by mouth daily. 06/18/22   [name], FNP  traMADol (ULTRAM) 50 MG tablet Take 1 tablet (50 mg total) by mouth every 6 (six) hours as needed for up to 20 doses. 06/09/22   Clance Boll, PA-C  Turmeric 500 MG TABS Take 1,000 mg by mouth.    [provider]     Family History  Problem Relation Age of Onset   Diabetes Mother    Hypertension Mother    Hypertension Father    Heart disease Father    Diabetes Sister    Kidney failure Sister    Diabetes Brother    Kidney disease Brother    Diabetes Sister    Diabetes Sister    Asthma Sister    Cancer Maternal Grandfather    Cancer Paternal Grandfather     Social History   Socioeconomic History   Marital status: Unknown    Spouse name: Not on file   Number of children: Not on file   Years of education: Not on file   Highest education level: Not on file  Occupational History   Occupation: retired  Tobacco Use   Smoking status: Former    Types: Cigarettes    Quit date: 2010    Years since quitting: 13.6   Smokeless tobacco: Never  Vaping Use   Vaping Use: Never used  Substance and Sexual Activity   Alcohol use: Yes    Comment: rare   Drug use: Never   Sexual activity: Not Currently  Other Topics Concern   Not on file  Social History Narrative   Not on file   Social Determinants of Health   Financial Resource Strain: Low Risk  (02/06/2022)   Overall Financial Resource Strain (CARDIA)    Difficulty of Paying Living Expenses: Not hard  at all  Food Insecurity: No Food Insecurity (02/06/2022)   Hunger Vital Sign    Worried About Running Out of Food in the Last Year: Never true    Ran Out of Food in the Last Year: Never true  Transportation Needs: No Transportation Needs (12/31/2021)   PRAPARE - Hydrologist (Medical): No    Lack of Transportation (Non-Medical): No  Physical Activity: Sufficiently Active (02/18/2022)   Exercise Vital Sign    Days of Exercise per Week: 4 days    Minutes of Exercise per Session: 90 min  Stress: No Stress Concern Present (12/31/2021)   [place]  Questionnaire    Feeling of Stress : Only a little  Social Connections: Not on file      Review of Systems denies fever, HA, CP,dyspnea, cough, worsening abd /back pain,N/V or bleeding  Vital Signs: BP 116/55; HR 59; R 16; O2 sats 98% RA   Physical Exam: awake/alert; chest- CTA bilat; heart- RRR; abd-soft,+BS,NT; no LE edema  Imaging: No results found.  Labs:  CBC: Recent Labs    10/16/21 1338 11/19/21 1436 02/18/22 1210 08/26/22 0851  WBC 5.3 9.0 7.4 7.3  HGB 14.6 12.2 12.7 12.9  HCT 46.2* 38.2 39.3 41.0  PLT 211 275 326 354    COAGS: Recent Labs    09/18/21 1517 08/26/22 0851  INR 1.0 1.0  APTT 28  --     BMP: Recent Labs    10/16/21 1338 11/19/21 1436 02/18/22 1210 06/05/22 1116  NA 140 140 141 142  K 4.0 5.1 5.0 4.5  CL 104 99 104 105  CO2 _0 GLUCOSE 117* 87 76 83  BUN 27* _1 CALCIUM 9.7 9.7 9.7 9.7  CREATININE 1.08* 1.15* 0.91 1.11*  GFRNONAA 55*  --   --  53*    LIVER FUNCTION TESTS: Recent Labs    09/18/21 1512 02/18/22 1210  BILITOT 0.5 0.5  AST 33 36  ALT 26 26  ALKPHOS 84 122*  PROT 7.6 7.7  ALBUMIN 4.7 4.3    TUMOR MARKERS: No results for input(s): "AFPTM", "CEA", "CA199", "CHROMGRNA" in the last 8760 hours.  Assessment and Plan: 71 y.o. female with PMH sig for RBBB, HLD, HTN,  hypothyroidism, prediabetes who underwent panniculectomy with ventral hernia repair on 10/32/22 followed by abd wall seroma excision on 06/10/22. Pt has had previous surgical drain placement into seroma as well as aspiration . She now presents with most recent US study in May of this year showing complex collections  in the right lower and left lower quadrants with apparent solid and cystic components. Given the history of surgery, these could represent fat necrosis or evolving hematoma. She is scheduled today for f/u US abd and possible aspiration/drainage of abd wall fluid collection(s) if needed. Risks and benefits of procedure was discussed with the patient including, but not limited to bleeding, infection, damage to adjacent structures or low yield requiring additional tests.  All of the questions were answered and there is agreement to proceed.  Consent signed and in chart.    Thank you for this interesting consult.  I greatly enjoyed meeting Nancy [name] and look forward to participating in their care.  A copy of this report was sent to the requesting provider on this date.  Electronically Signed: D. Rowe Robert, PA-C 08/26/2022, 9:49 AM   I spent a total of  20 minutes   in face to face in clinical consultation, greater than 50% of which was counseling/coordinating care for image guided aspiration/possible drainage of abdominal wall fluid collection

## 2022-08-26 NOTE — Telephone Encounter (Signed)
I spoke with Nancy [name], she had her ultrasound and drain placed today it appears they took off approximately 120 cc.  She tolerated this well, she notes she is doing well at home.  We will see her in the office in 2 days.  She will reach out immediately with any questions or concerns or any change in conditions.

## 2022-08-26 NOTE — Discharge Instructions (Addendum)
JP Drain Totals °· Bring this sheet to all of your post-operative appointments while you have your drains. °· Please measure your drains by CC's or ML's. °· Make sure you drain and measure your JP Drains 3 times per day. °· At the end of each day, add up totals for the left side and add up totals for the right side. °   ( 9 am )     ( 3 pm )        ( 9 pm )                °Date L  R  L  R  L  R  Total L/R  °               °               °               °               °               °               °               °               °               °               °               °               ° °

## 2022-08-28 ENCOUNTER — Ambulatory Visit: Payer: Medicare HMO | Admitting: Physician Assistant

## 2022-08-28 DIAGNOSIS — S301XXD Contusion of abdominal wall, subsequent encounter: Secondary | ICD-10-CM

## 2022-08-28 DIAGNOSIS — L7634 Postprocedural seroma of skin and subcutaneous tissue following other procedure: Secondary | ICD-10-CM

## 2022-08-28 NOTE — Progress Notes (Signed)
This is a pleasant 71 year old female seen in our office for follow-up evaluation status post excision of abdominal wall seroma by Dr. Marla Roe on 06/10/2022.  At her last office visit on 08/21/2022 she was doing well with no significant complaints.  She did have her drains removed previously.  I had scheduled a follow-up ultrasound with catheter placement if any fluid collection noted.  Since her last office visit she did have the ultrasound which revealed approximately 120 cc of fluid, subsequently a percutaneous drain was placed.  She tolerated this well, since that time she has had no significant complaints or concerns.  She denies any infectious symptoms including fever chills, she denies any signs of infection.  She notes that her drain has been putting out approximately 30 cc of fluid per day.  Chaperone present.  On exam abdomen is soft nontender with no obvious fluid collections.  No significant firmness or distention.  No surrounding redness or warmth at the drain site, her abdominal incision is clean dry and intact.  JP drain with minimal serous output.  Overall the patient is doing well, I am happy that her drain is putting out much less than it was previously.  She has no signs of infection on exam and no fluid collections either.  I would like to follow her closely, we will have her reach out to the office on Monday to let us know how her drain output is.  We will have a follow-up evaluation based on her output.  She was given strict return precautions.  She verbalized understanding and agreement to today's plan had no further questions or concerns.

## 2022-08-31 ENCOUNTER — Telehealth: Payer: Self-pay | Admitting: Physician Assistant

## 2022-08-31 NOTE — Telephone Encounter (Signed)
Pt stated that she has had 15cc daily for the past 3 day and would like to know if Merry Proud would like for her to come in to have the tubes removed.  Pt would like to have a call back to see if she needs to come in today or tomorrow to have it removed.

## 2022-09-01 ENCOUNTER — Ambulatory Visit (INDEPENDENT_AMBULATORY_CARE_PROVIDER_SITE_OTHER): Payer: Medicare HMO | Admitting: Physician Assistant

## 2022-09-01 DIAGNOSIS — S301XXD Contusion of abdominal wall, subsequent encounter: Secondary | ICD-10-CM

## 2022-09-01 DIAGNOSIS — L7634 Postprocedural seroma of skin and subcutaneous tissue following other procedure: Secondary | ICD-10-CM

## 2022-09-01 NOTE — Telephone Encounter (Signed)
I called her back yesterday, attempted to update her this morning, I left a voicemail. If she calls let me know and I will speak to her

## 2022-09-01 NOTE — Progress Notes (Signed)
This is a pleasant 71 year old female seen in our office for follow-up evaluation status post excision of abdominal wall seroma by Dr. Marla Roe on 06/10/2022.  At her last office visit on 08/28/2022 she was doing well with no significant complaints.  Her JP drain was putting out approximately 30 cc/day at that time.  Since her last office visit she notes the output has significant reduced, over the last 3 days she has had approximately 10 cc/day with even less output over the last 24 hours.  She denies any infectious symptoms including fever chills nausea or vomiting, she denies any significant pain or swelling of the abdomen.  Chaperone present.  On exam abdomen soft nontender with no obvious fluid collections, no significant firmness or distention.  No surrounding redness warmth the drain site, her abdominal incision is clean dry and intact with routine healing.  JP drain has minimal serous output.  Ms. Sieling and I had a lengthy discussion on options moving forward, her drain is putting out very little serous drainage which is very positive giving her lengthy course of drain and high output.  I asked the patient what she would like to do she indicated that she does feel comfortable with the drain being removed.  I do agree with her given the minimal output that pulling the drain at this time would be a good idea.  She tolerated this without difficulty.  She will continue to monitor very closely for any worsening signs or symptoms including swelling, pain, fever, chills, drainage, redness.  She will return if any concerning signs or symptoms present.  We will see her in 1 week for follow-up evaluation.  She verbalized understanding and agreement to today's plan had no further questions or concerns.

## 2022-09-01 NOTE — Telephone Encounter (Signed)
Pt seen in office today.

## 2022-09-09 ENCOUNTER — Encounter: Payer: Self-pay | Admitting: Physician Assistant

## 2022-09-09 ENCOUNTER — Ambulatory Visit (INDEPENDENT_AMBULATORY_CARE_PROVIDER_SITE_OTHER): Payer: Medicare HMO | Admitting: Physician Assistant

## 2022-09-09 DIAGNOSIS — S301XXD Contusion of abdominal wall, subsequent encounter: Secondary | ICD-10-CM

## 2022-09-09 DIAGNOSIS — L7634 Postprocedural seroma of skin and subcutaneous tissue following other procedure: Secondary | ICD-10-CM

## 2022-09-09 NOTE — Progress Notes (Cosign Needed Addendum)
This is a pleasant 71 year old female seen in our office for follow-up evaluation status post excision of abdominal wall seroma by Dr. Marla Roe on 06/10/2022.  At her last office visit on 09/01/2022 she had been doing well.  Her drain had been removed previously without complication, she had no ongoing fluid collection.  Since her last office visit she notes she is doing well, she denies any significant complaints.  She does note some ongoing fullness in her lower abdomen particularly on the left, but feels that things have not changed since her last office visit.  She denies any infectious symptoms including fever chills nausea or vomiting, no issues with her incision.  Overall she is very happy and is satisfied at this point.  Chaperone present.  On exam the abdomen is soft nontender with no obvious fluid collection, no firmness or distention.  No overlying redness, drain site has healed, her incisions are clean dry and intact with routine healing.  Overall the patient is very happy at this point as there has been no significant reaccumulation of fluid.  We would like to cautiously follow her given her significant past medical history and prolonged drain.  We will see the patient back in our office in 1 month for repeat evaluation or sooner if she develops any new or worsening signs or symptoms.  The patient verbalized understanding and agreement to today's plan had no further questions or concerns.

## 2022-09-17 ENCOUNTER — Telehealth: Payer: Self-pay

## 2022-09-17 NOTE — Chronic Care Management (AMB) (Signed)
Chronic Care Management Pharmacy Assistant   Name: Nancy [name]  MRN: [id number] DOB: January 13, 1951   Reason for Encounter: Medication Review/ Medication coordination  Recent office visits:  None  Recent consult visits:  09-09-2022 Okey Regal, PA-C (Plastic surg). Post op visit.   09-01-2022 Okey Regal, PA-C (Plastic surg). Post op visit.   08-28-2022 Okey Regal, PA-C (Plastic surg). Post op visit.   08-21-2022 Okey Regal, PA-C (Plastic surg). Post op visit.   08-14-2022 Okey Regal, PA-C (Plastic surg). Post op visit.   08-06-2022 Okey Regal, PA-C (Plastic surg). Post op visit.   07-31-2022 Okey Regal, PA-C (Plastic surg). Post op visit.   07-27-2022 Okey Regal, PA-C (Plastic surg). Post op visit.   07-20-2022 Okey Regal, PA-C (Plastic surg). Post op visit.   07-13-2022 Okey Regal, PA-C (Plastic surg). Post op visit.   07-10-2022 Okey Regal, PA-C (Plastic surg). Post op visit.   07-07-2022 Okey Regal, PA-C (Plastic surg). Post op visit.   07-03-2022 Okey Regal, PA-C (Plastic surg). Post op visit.   06-30-2022 Clance Boll, PA-C (Plastic surg). Post op visit.   06-19-2022 Clance Boll, PA-C (Plastic surg). Post op visit.   Hospital visits:  None in previous 6 months  Medications: Outpatient Encounter Medications as of 09/17/2022  Medication Sig   atorvastatin (LIPITOR) 20 MG tablet Take 1 tablet (20 mg total) by mouth daily.   azelastine (OPTIVAR) 0.05 % ophthalmic solution INSTILL ONE DROP IN [facility] TWICE DAILY Strength: 0.05 %   blood glucose meter kit and supplies Dispense based on patient and insurance preference. Use up to four times daily as directed. (FOR ICD-10 E10.9, E11.9).   Blood Glucose Monitoring Suppl (ACCU-CHEK GUIDE) w/Device KIT 1 Device by Does not apply route daily.   Blood Pressure Monitor KIT USE TO CHECK BLOOD PRESSURE 110   Cholecalciferol 125 MCG (5000 UT) TABS  Take 5,000 Units by mouth daily.   clobetasol (TEMOVATE) 0.05 % external solution Apply to scalp nightly   clobetasol ointment (TEMOVATE) 0.05 % Apply to elbows knees and back behind ears nightly   desoximetasone (TOPICORT) 0.25 % cream Apply to skin daily safe to use on face and skin folds   Flaxseed, Linseed, (FLAXSEED OIL) 1000 MG CAPS Take by mouth.   GLUCOSAMINE CHONDROITIN COMPLX PO Take 1 capsule by mouth daily. 1500 mg/1200 mg   Lactobacillus (PROBIOTIC ACIDOPHILUS PO) Take 0.5 mg by mouth daily.   Lancets (ONETOUCH ULTRASOFT) lancets USE TO check blood sugar DAILY   levothyroxine (SYNTHROID) 150 MCG tablet Take 1 tablet (150 mcg total) by mouth daily before breakfast.   Magnesium 250 MG TABS Take 250 mg by mouth daily.   Multiple Vitamins-Minerals (MULTIVITAMIN WOMEN 50+) TABS Take 1 tablet by mouth daily.   ondansetron (ZOFRAN) 4 MG tablet Take 1 tablet (4 mg total) by mouth every 8 (eight) hours as needed for up to 20 doses for nausea or vomiting.   ONETOUCH ULTRA test strip USE AS INSTRUCTED   spironolactone (ALDACTONE) 50 MG tablet Take 1 tablet (50 mg total) by mouth daily.   traMADol (ULTRAM) 50 MG tablet Take 1 tablet (50 mg total) by mouth every 6 (six) hours as needed for up to 20 doses.   Turmeric 500 MG TABS Take 1,000 mg by mouth.   No facility-administered encounter medications on file as of 09/17/2022.  Reviewed chart for medication changes ahead of medication coordination call.  No medication changes indicated   BP Readings from Last 3 Encounters:  08/26/22 103/63  06/10/22 135/65  02/18/22 132/74    Lab Results  Component Value Date   HGBA1C 5.6 02/18/2022     Patient obtains medications through Adherence Packaging  90 Days   Last adherence delivery included:  Onetouch test strips- Use as directed Atorvastatin 20 mg - 1 tablet daily at bedtime Levothyroxine 150 mcg- 1 tablet daily before breakfast Spironolactone 50 mg- 1 tablet daily at  breakfast Azelastine drops 0.05%- 1 drop in each eye twice daily Lancets  Patient declined (meds) last delivery: None  Patient is due for next adherence delivery on: 09-29-2022  Called patient and reviewed medications and coordinated delivery.  This delivery to include: Onetouch test strips- Use as directed Atorvastatin 20 mg - 1 tablet daily at bedtime Levothyroxine 150 mcg- 1 tablet daily before breakfast Spironolactone 50 mg- 1 tablet daily at breakfast Azelastine drops 0.05%- 1 drop in each eye twice daily Lancets  No acute/short fill needed  Patient declined the following medications: None  Patient needs refills for: Sent to PCP Test strips Azelastine drops- Sent by [facility]  Confirmed delivery date of 09-29-2022, advised patient that pharmacy will contact them the morning of delivery.  Care Gaps: Covid booster overdue Flu vaccine overdue A1C overdue  Star Rating Drugs: Atorvastatin 20 mg- Last filled 06-24-2022 [address]  [place] Clinical Pharmacist Assistant [phone]

## 2022-09-22 ENCOUNTER — Telehealth: Payer: Self-pay

## 2022-09-22 DIAGNOSIS — R7303 Prediabetes: Secondary | ICD-10-CM

## 2022-09-22 MED ORDER — ONETOUCH ULTRA VI STRP: ORAL_STRIP | 12 refills | Status: DC

## 2022-09-22 NOTE — Telephone Encounter (Signed)
Patient needs One Touch Test Strips refilled. Orlando Penner, CPP, PharmD Clinical Pharmacist Practitioner [facility] [phone]

## 2022-09-24 ENCOUNTER — Ambulatory Visit (INDEPENDENT_AMBULATORY_CARE_PROVIDER_SITE_OTHER): Payer: Medicare HMO | Admitting: Nurse Practitioner

## 2022-09-24 ENCOUNTER — Encounter: Payer: Self-pay | Admitting: Nurse Practitioner

## 2022-09-24 VITALS — BP 118/80 | HR 70 | Temp 98.8°F | Ht 65.0 in | Wt 223.2 lb

## 2022-09-24 DIAGNOSIS — R7303 Prediabetes: Secondary | ICD-10-CM

## 2022-09-24 DIAGNOSIS — I10 Essential (primary) hypertension: Secondary | ICD-10-CM

## 2022-09-24 DIAGNOSIS — R1084 Generalized abdominal pain: Secondary | ICD-10-CM | POA: Diagnosis not present

## 2022-09-24 DIAGNOSIS — E782 Mixed hyperlipidemia: Secondary | ICD-10-CM

## 2022-09-24 DIAGNOSIS — Z23 Encounter for immunization: Secondary | ICD-10-CM

## 2022-09-24 DIAGNOSIS — Z6837 Body mass index (BMI) 37.0-37.9, adult: Secondary | ICD-10-CM | POA: Diagnosis not present

## 2022-09-24 DIAGNOSIS — R69 Illness, unspecified: Secondary | ICD-10-CM | POA: Diagnosis not present

## 2022-09-24 DIAGNOSIS — Z8719 Personal history of other diseases of the digestive system: Secondary | ICD-10-CM

## 2022-09-24 DIAGNOSIS — F439 Reaction to severe stress, unspecified: Secondary | ICD-10-CM

## 2022-09-24 NOTE — Patient Instructions (Signed)
Hypertension, Adult High blood pressure (hypertension) is when the force of blood pumping through the arteries is too strong. The arteries are the blood vessels that carry blood from the heart throughout the body. Hypertension forces the heart to work harder to pump blood and may cause arteries to become narrow or stiff. Untreated or uncontrolled hypertension can lead to a heart attack, heart failure, a stroke, kidney disease, and other problems. A blood pressure reading consists of a higher number over a lower number. Ideally, your blood pressure should be below 120/80. The first ("top") number is called the systolic pressure. It is a measure of the pressure in your arteries as your heart beats. The second ("bottom") number is called the diastolic pressure. It is a measure of the pressure in your arteries as the heart relaxes. What are the causes? The exact cause of this condition is not known. There are some conditions that result in high blood pressure. What increases the risk? Certain factors may make you more likely to develop high blood pressure. Some of these risk factors are under your control, including: Smoking. Not getting enough exercise or physical activity. Being overweight. Having too much fat, sugar, calories, or salt (sodium) in your diet. Drinking too much alcohol. Other risk factors include: Having a personal history of heart disease, diabetes, high cholesterol, or kidney disease. Stress. Having a family history of high blood pressure and high cholesterol. Having obstructive sleep apnea. Age. The risk increases with age. What are the signs or symptoms? High blood pressure may not cause symptoms. Very high blood pressure (hypertensive crisis) may cause: Headache. Fast or irregular heartbeats (palpitations). Shortness of breath. Nosebleed. Nausea and vomiting. Vision changes. Severe chest pain, dizziness, and seizures. How is this diagnosed? This condition is diagnosed by  measuring your blood pressure while you are seated, with your arm resting on a flat surface, your legs uncrossed, and your feet flat on the floor. The cuff of the blood pressure monitor will be placed directly against the skin of your upper arm at the level of your heart. Blood pressure should be measured at least twice using the same arm. Certain conditions can cause a difference in blood pressure between your right and left arms. If you have a high blood pressure reading during one visit or you have normal blood pressure with other risk factors, you may be asked to: Return on a different day to have your blood pressure checked again. Monitor your blood pressure at home for 1 week or longer. If you are diagnosed with hypertension, you may have other blood or imaging tests to help your health care provider understand your overall risk for other conditions. How is this treated? This condition is treated by making healthy lifestyle changes, such as eating healthy foods, exercising more, and reducing your alcohol intake. You may be referred for counseling on a healthy diet and physical activity. Your health care provider may prescribe medicine if lifestyle changes are not enough to get your blood pressure under control and if: Your systolic blood pressure is above 130. Your diastolic blood pressure is above 80. Your personal target blood pressure may vary depending on your medical conditions, your age, and other factors. Follow these instructions at home: Eating and drinking  Eat a diet that is high in fiber and potassium, and low in sodium, added sugar, and fat. An example of this eating plan is called the DASH diet. DASH stands for Dietary Approaches to Stop Hypertension. To eat this way: Eat   plenty of fresh fruits and vegetables. Try to fill one half of your plate at each meal with fruits and vegetables. Eat whole grains, such as whole-wheat pasta, brown rice, or whole-grain bread. Fill about one  fourth of your plate with whole grains. Eat or drink low-fat dairy products, such as skim milk or low-fat yogurt. Avoid fatty cuts of meat, processed or cured meats, and poultry with skin. Fill about one fourth of your plate with lean proteins, such as fish, chicken without skin, beans, eggs, or tofu. Avoid pre-made and processed foods. These tend to be higher in sodium, added sugar, and fat. Reduce your daily sodium intake. Many people with hypertension should eat less than 1,500 mg of sodium a day. Do not drink alcohol if: Your health care provider tells you not to drink. You are pregnant, may be pregnant, or are planning to become pregnant. If you drink alcohol: Limit how much you have to: 0-1 drink a day for women. 0-2 drinks a day for men. Know how much alcohol is in your drink. In the [place] one drink equals one 12 oz bottle of beer (355 mL), one 5 oz glass of wine (148 mL), or one 1 oz glass of hard liquor (44 mL). Lifestyle  Work with your health care provider to maintain a healthy body weight or to lose weight. Ask what an ideal weight is for you. Get at least 30 minutes of exercise that causes your heart to beat faster (aerobic exercise) most days of the week. Activities may include walking, swimming, or biking. Include exercise to strengthen your muscles (resistance exercise), such as Pilates or lifting weights, as part of your weekly exercise routine. Try to do these types of exercises for 30 minutes at least 3 days a week. Do not use any products that contain nicotine or tobacco. These products include cigarettes, chewing tobacco, and vaping devices, such as e-cigarettes. If you need help quitting, ask your health care provider. Monitor your blood pressure at home as told by your health care provider. Keep all follow-up visits. This is important. Medicines Take over-the-counter and prescription medicines only as told by your health care provider. Follow directions carefully. Blood  pressure medicines must be taken as prescribed. Do not skip doses of blood pressure medicine. Doing this puts you at risk for problems and can make the medicine less effective. Ask your health care provider about side effects or reactions to medicines that you should watch for. Contact a health care provider if you: Think you are having a reaction to a medicine you are taking. Have headaches that keep coming back (recurring). Feel dizzy. Have swelling in your ankles. Have trouble with your vision. Get help right away if you: Develop a severe headache or confusion. Have unusual weakness or numbness. Feel faint. Have severe pain in your chest or abdomen. Vomit repeatedly. Have trouble breathing. These symptoms may be an emergency. Get help right away. Call 911. Do not wait to see if the symptoms will go away. Do not drive yourself to the hospital. Summary Hypertension is when the force of blood pumping through your arteries is too strong. If this condition is not controlled, it may put you at risk for serious complications. Your personal target blood pressure may vary depending on your medical conditions, your age, and other factors. For most people, a normal blood pressure is less than 120/80. Hypertension is treated with lifestyle changes, medicines, or a combination of both. Lifestyle changes include losing weight, eating a healthy,   low-sodium diet, exercising more, and limiting alcohol. This information is not intended to replace advice given to you by your health care provider. Make sure you discuss any questions you have with your health care provider. Document Revised: 10/14/2021 Document Reviewed: 10/14/2021 Elsevier Patient Education  2023 [employer]  

## 2022-09-24 NOTE — Progress Notes (Signed)
I,Tianna Badgett,acting as a Education administrator for [employer], FNP.,have documented all relevant documentation on the behalf of [name], FNP,as directed by  [name], FNP while in the presence of [name], [place].  Subjective:     Patient ID: Nancy [name] , female    DOB: 1951/03/08 , 71 y.o.   MRN: [id number]   Chief Complaint  Patient presents with   Hypertension    HPI  Patient presents today for HTN follow up. She had a seroma to the panniculus - when she started exercising she felt the seroma occurred. She is having more gi upset after taking the medications for H. Pylori, she is now having more belching and pain after eating foods.   Hypertension This is a chronic problem. The problem is controlled. Pertinent negatives include no anxiety. Risk factors for coronary artery disease include obesity and sedentary lifestyle. Past treatments include diuretics. There are no compliance problems.  There is no history of angina. There is no history of chronic renal disease.     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   Complication of anesthesia    pt states she woke up during a tonsillectomy when she was 28   Hyperlipidemia    Hypertension    Hypothyroidism 1980   Pre-diabetes    Prediabetes    Thyroid disease      Family History  Problem Relation Age of Onset   Diabetes Mother    Hypertension Mother    Hypertension Father    Heart disease Father    Diabetes Sister    Kidney failure Sister    Diabetes Brother    Kidney disease Brother    Diabetes Sister    Diabetes Sister    Asthma Sister    Cancer Maternal Grandfather    Cancer Paternal Grandfather      Current Outpatient Medications:    atorvastatin (LIPITOR) 20 MG tablet, Take 1 tablet (20 mg total) by mouth daily., Disp: 90 tablet, Rfl: 1   azelastine (OPTIVAR) 0.05 % ophthalmic solution, INSTILL ONE DROP IN [facility] EYE TWICE DAILY  Strength: 0.05 %, Disp: 6 mL, Rfl: 3   blood glucose meter kit and supplies, Dispense based on patient and insurance preference. Use up to four times daily as directed. (FOR ICD-10 E10.9, E11.9)., Disp: 1 each, Rfl: 3   Blood Glucose Monitoring Suppl (ACCU-CHEK GUIDE) w/Device KIT, 1 Device by Does not apply route daily., Disp: 1 kit, Rfl: 0   Blood Pressure Monitor KIT, USE TO CHECK BLOOD PRESSURE 110, Disp: 1 kit, Rfl: 2   Cholecalciferol 125 MCG (5000 UT) TABS, Take 5,000 Units by mouth daily., Disp: , Rfl:    Flaxseed, Linseed, (FLAXSEED OIL) 1000 MG CAPS, Take by mouth., Disp: , Rfl:    GLUCOSAMINE CHONDROITIN COMPLX PO, Take 1 capsule by mouth daily. 1500 mg/1200 mg, Disp: , Rfl:    glucose blood (ONETOUCH ULTRA) test strip, USE AS INSTRUCTED, Disp: 100 strip, Rfl: 12   Lactobacillus (PROBIOTIC ACIDOPHILUS PO), Take 0.5 mg by mouth daily., Disp: , Rfl:    Lancets (ONETOUCH ULTRASOFT) lancets, USE TO check blood sugar DAILY, Disp: 100 each, Rfl: 2   levothyroxine (SYNTHROID) 150 MCG tablet, Take 1 tablet (150 mcg total) by mouth daily before breakfast., Disp: 90 tablet, Rfl: 1   Magnesium 250 MG TABS, Take 250 mg by mouth daily., Disp: , Rfl:    Multiple Vitamins-Minerals (  MULTIVITAMIN WOMEN 50+) TABS, Take 1 tablet by mouth daily., Disp: , Rfl:    spironolactone (ALDACTONE) 50 MG tablet, Take 1 tablet (50 mg total) by mouth daily., Disp: 90 tablet, Rfl: 1   Turmeric 500 MG TABS, Take 1,000 mg by mouth., Disp: , Rfl:    clobetasol (TEMOVATE) 0.05 % external solution, Apply to scalp nightly (Patient not taking: Reported on 09/24/2022), Disp: 50 mL, Rfl: 6   clobetasol ointment (TEMOVATE) 0.05 %, Apply to elbows knees and back behind ears nightly (Patient not taking: Reported on 09/24/2022), Disp: 60 g, Rfl: 4   desoximetasone (TOPICORT) 0.25 % cream, Apply to skin daily safe to use on face and skin folds (Patient not taking: Reported on 09/24/2022), Disp: 100 g, Rfl: 4   ondansetron (ZOFRAN) 4 MG  tablet, Take 1 tablet (4 mg total) by mouth every 8 (eight) hours as needed for up to 20 doses for nausea or vomiting. (Patient not taking: Reported on 09/24/2022), Disp: 20 tablet, Rfl: 0   traMADol (ULTRAM) 50 MG tablet, Take 1 tablet (50 mg total) by mouth every 6 (six) hours as needed for up to 20 doses. (Patient not taking: Reported on 09/24/2022), Disp: 20 tablet, Rfl: 0   No Known Allergies   Review of Systems  Constitutional: Negative.   Respiratory: Negative.    Cardiovascular: Negative.   Gastrointestinal: Negative.   Neurological: Negative.      Today's Vitals   09/24/22 1058  BP: 118/80  Pulse: 70  Temp: 98.8 F (37.1 C)  TempSrc: Oral  Weight: 223 lb 3.2 oz (101.2 kg)  Height: 5' 5"  (1.651 m)   Body mass index is 37.14 kg/m.  Wt Readings from Last 3 Encounters:  09/24/22 223 lb 3.2 oz (101.2 kg)  08/26/22 225 lb (102.1 kg)  06/10/22 216 lb 14.9 oz (98.4 kg)    Objective:  Physical Exam Vitals reviewed.  Constitutional:      General: She is not in acute distress.    Appearance: Normal appearance.  Pulmonary:     Effort: Pulmonary effort is normal. No respiratory distress.     Breath sounds: No wheezing.  Abdominal:     General: Abdomen is flat. Bowel sounds are normal. There is no distension.     Palpations: Abdomen is soft. There is no mass.     Tenderness: There is no abdominal tenderness.  Musculoskeletal:        General: No tenderness.     Right lower leg: No edema.     Left lower leg: No edema.  Skin:    Capillary Refill: Capillary refill takes less than 2 seconds.  Neurological:     General: No focal deficit present.     Mental Status: She is alert and oriented to person, place, and time.     Cranial Nerves: No cranial nerve deficit.     Motor: No weakness.  Psychiatric:        Mood and Affect: Mood normal.        Behavior: Behavior normal.        Thought Content: Thought content normal.        Judgment: Judgment normal.          Assessment And Plan:     1. Essential hypertension Comments: Blood pressure is controlled, continue current medications.  - CMP14+EGFR  2. Prediabetes Comments: Stable, no current medications. Encouraged to continue focusing on healthy diet low in sugar and starches. When feels able encouraged to exercise - Hemoglobin A1c  3. Mixed hyperlipidemia - CMP14+EGFR  4. Need for influenza vaccination - Flu Vaccine QUAD High Dose(Fluad)  5. Generalized abdominal pain Comments: She has had intermittent abdominal pain and no relief after being treated for H pylori. Will refer to GI for further evaluation - Ambulatory referral to Gastroenterology - Lipase - Amylase - CMP14+EGFR  6. Stress Comments: She would like to see a counselor. Referral placed.  - Ambulatory referral to Psychology  7. History of diverticulosis - Ambulatory referral to Gastroenterology - Lipase - Amylase - CMP14+EGFR  8. Class 2 severe obesity due to excess calories with serious comorbidity and body mass index (BMI) of 37.0 to 37.9 in adult [facility]) She is encouraged to strive for BMI less than 30 to decrease cardiac risk. Advised to aim for at least 150 minutes of exercise per week.   Patient was given opportunity to ask questions. Patient verbalized understanding of the plan and was able to repeat key elements of the plan. All questions were answered to their satisfaction.  [name], FNP    I, [name], FNP, have reviewed all documentation for this visit. The documentation on 09/24/22 for the exam, diagnosis, procedures, and orders are all accurate and complete.  IF YOU HAVE BEEN REFERRED TO A SPECIALIST, IT MAY TAKE 1-2 WEEKS TO SCHEDULE/PROCESS THE REFERRAL. IF YOU HAVE NOT HEARD FROM US/SPECIALIST IN TWO WEEKS, PLEASE GIVE [place] A CALL AT [phone].   THE PATIENT IS ENCOURAGED TO PRACTICE SOCIAL DISTANCING DUE TO THE COVID-19 PANDEMIC.

## 2022-09-25 LAB — CMP14+EGFR
ALT: 18 IU/L (ref 0–32)
AST: 23 IU/L (ref 0–40)
Albumin/Globulin Ratio: 1.7 (ref 1.2–2.2)
Albumin: 4.5 g/dL (ref 3.8–4.8)
Alkaline Phosphatase: 80 IU/L (ref 44–121)
BUN/Creatinine Ratio: 22 (ref 12–28)
BUN: 25 mg/dL (ref 8–27)
Bilirubin Total: 0.4 mg/dL (ref 0.0–1.2)
CO2: 24 mmol/L (ref 20–29)
Calcium: 9.5 mg/dL (ref 8.7–10.3)
Chloride: 106 mmol/L (ref 96–106)
Creatinine, Ser: 1.15 mg/dL — ABNORMAL HIGH (ref 0.57–1.00)
Globulin, Total: 2.6 g/dL (ref 1.5–4.5)
Glucose: 84 mg/dL (ref 70–99)
Potassium: 4.6 mmol/L (ref 3.5–5.2)
Sodium: 146 mmol/L — ABNORMAL HIGH (ref 134–144)
Total Protein: 7.1 g/dL (ref 6.0–8.5)
eGFR: 51 mL/min/{1.73_m2} — ABNORMAL LOW (ref >59)

## 2022-09-25 LAB — LIPASE: Lipase: 58 U/L (ref 14–85)

## 2022-09-25 LAB — HEMOGLOBIN A1C
Est. average glucose Bld gHb Est-mCnc: 120 mg/dL
Hgb A1c MFr Bld: 5.8 % — ABNORMAL HIGH (ref 4.8–5.6)

## 2022-09-25 LAB — AMYLASE: Amylase: 92 U/L (ref 31–110)

## 2022-10-07 ENCOUNTER — Ambulatory Visit: Payer: Medicare HMO | Admitting: Physician Assistant

## 2022-10-07 ENCOUNTER — Encounter: Payer: Self-pay | Admitting: Physician Assistant

## 2022-10-07 DIAGNOSIS — S301XXA Contusion of abdominal wall, initial encounter: Secondary | ICD-10-CM

## 2022-10-07 DIAGNOSIS — L7634 Postprocedural seroma of skin and subcutaneous tissue following other procedure: Secondary | ICD-10-CM

## 2022-10-07 NOTE — Progress Notes (Signed)
This is a pleasant 71 year old female seen in our office for follow-up evaluation status post excision of abdominal wall seroma by Dr. Marla Roe on 06/10/2022.  At her last office visit on 09/09/22. She had been doing well with no issue or concerns.   Since her last visit she denies any complaints or concerns. She has been walking but not to the extend she had previously. She denies any infectious signs or symptoms, no abdominal swelling.  Chaperone present.  On exam the abdomen is soft nontender with no obvious fluid collection, no firmness or distention.  No overlying redness, drain site has healed, her incisions are clean dry and intact with routine healing.   Final photo placed in chart  Overall the patient is doing well.  She has no fluid collections, signs of infection, or issues. Fortunately she had continue to progress and has had no re accumulation of fluid. Her incisions are CDI with routine healing. Given her progress she does not need formal follow up evaluation unless any issue or concerns arise. She was given strict return precautions. She verbalized understanding and agreement to today's plan.

## 2022-10-21 DIAGNOSIS — R1012 Left upper quadrant pain: Secondary | ICD-10-CM | POA: Diagnosis not present

## 2022-10-21 DIAGNOSIS — Z1211 Encounter for screening for malignant neoplasm of colon: Secondary | ICD-10-CM | POA: Diagnosis not present

## 2022-10-21 DIAGNOSIS — R1013 Epigastric pain: Secondary | ICD-10-CM | POA: Diagnosis not present

## 2022-11-10 DIAGNOSIS — H25812 Combined forms of age-related cataract, left eye: Secondary | ICD-10-CM | POA: Diagnosis not present

## 2022-11-10 DIAGNOSIS — H0288A Meibomian gland dysfunction right eye, upper and lower eyelids: Secondary | ICD-10-CM | POA: Diagnosis not present

## 2022-11-10 DIAGNOSIS — R7309 Other abnormal glucose: Secondary | ICD-10-CM | POA: Diagnosis not present

## 2022-11-10 DIAGNOSIS — H1045 Other chronic allergic conjunctivitis: Secondary | ICD-10-CM | POA: Diagnosis not present

## 2022-11-10 DIAGNOSIS — H35033 Hypertensive retinopathy, bilateral: Secondary | ICD-10-CM | POA: Diagnosis not present

## 2022-11-10 DIAGNOSIS — H0288B Meibomian gland dysfunction left eye, upper and lower eyelids: Secondary | ICD-10-CM | POA: Diagnosis not present

## 2022-11-10 LAB — HM DIABETES EYE EXAM

## 2022-11-16 ENCOUNTER — Ambulatory Visit: Payer: Medicare HMO | Admitting: Clinical

## 2022-11-16 DIAGNOSIS — R69 Illness, unspecified: Secondary | ICD-10-CM | POA: Diagnosis not present

## 2022-11-16 DIAGNOSIS — F4321 Adjustment disorder with depressed mood: Secondary | ICD-10-CM | POA: Diagnosis not present

## 2022-11-16 NOTE — Progress Notes (Signed)
                Ras Kollman, LCSW 
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
## 2022-11-20 DIAGNOSIS — H25812 Combined forms of age-related cataract, left eye: Secondary | ICD-10-CM | POA: Diagnosis not present

## 2022-11-30 ENCOUNTER — Ambulatory Visit: Payer: Medicare HMO | Admitting: Clinical

## 2022-12-02 DIAGNOSIS — H2511 Age-related nuclear cataract, right eye: Secondary | ICD-10-CM | POA: Diagnosis not present

## 2022-12-08 DIAGNOSIS — H25811 Combined forms of age-related cataract, right eye: Secondary | ICD-10-CM | POA: Diagnosis not present

## 2022-12-13 ENCOUNTER — Other Ambulatory Visit: Payer: Self-pay | Admitting: Nurse Practitioner

## 2022-12-13 DIAGNOSIS — I10 Essential (primary) hypertension: Secondary | ICD-10-CM

## 2022-12-13 DIAGNOSIS — E039 Hypothyroidism, unspecified: Secondary | ICD-10-CM

## 2022-12-17 ENCOUNTER — Other Ambulatory Visit: Payer: Self-pay

## 2022-12-17 ENCOUNTER — Telehealth: Payer: Self-pay

## 2022-12-17 MED ORDER — ONETOUCH ULTRASOFT LANCETS MISC: 5 refills | Status: DC

## 2022-12-17 NOTE — Chronic Care Management (AMB) (Signed)
Chronic Care Management Pharmacy Assistant   Name: Nancy [name]  MRN: [id number] DOB: 05/29/1951  Reason for Encounter: Medication Review/ Medication coordination  Recent office visits:  09-24-2022 [name], [place]. Creatinine= 1.15, eGFR= 51, Sodium= 146. A1C= 5.8. Referral placed to gastro and psychology. Flu vaccine given.   Recent consult visits:  11-16-2022 Katherina Right, LCSW (Behavioral health). Unable to view encounter.  10-21-2022 Carol Ada Gertie Fey). Colon cancer screening.  10-07-2022 Okey Regal, PA-C (Plastic surgery). Follow up visit.  Hospital visits:  None in previous 6 months  Medications: Outpatient Encounter Medications as of 12/17/2022  Medication Sig   atorvastatin (LIPITOR) 20 MG tablet TAKE ONE TABLET BY MOUTH EVERYDAY AT BEDTIME FOR CHOLESTEROL   azelastine (OPTIVAR) 0.05 % ophthalmic solution INSTILL ONE DROP IN [facility] EYE TWICE DAILY Strength: 0.05 %   blood glucose meter kit and supplies Dispense based on patient and insurance preference. Use up to four times daily as directed. (FOR ICD-10 E10.9, E11.9).   Blood Glucose Monitoring Suppl (ACCU-CHEK GUIDE) w/Device KIT 1 Device by Does not apply route daily.   Blood Pressure Monitor KIT USE TO CHECK BLOOD PRESSURE 110   Cholecalciferol 125 MCG (5000 UT) TABS Take 5,000 Units by mouth daily.   clobetasol (TEMOVATE) 0.05 % external solution Apply to scalp nightly   clobetasol ointment (TEMOVATE) 0.05 % Apply to elbows knees and back behind ears nightly   desoximetasone (TOPICORT) 0.25 % cream Apply to skin daily safe to use on face and skin folds   Flaxseed, Linseed, (FLAXSEED OIL) 1000 MG CAPS Take by mouth.   GLUCOSAMINE CHONDROITIN COMPLX PO Take 1 capsule by mouth daily. 1500 mg/1200 mg   glucose blood (ONETOUCH ULTRA) test strip USE AS INSTRUCTED   Lactobacillus (PROBIOTIC ACIDOPHILUS PO) Take 0.5 mg by mouth daily.   Lancets (ONETOUCH ULTRASOFT) lancets USE TO check blood sugar DAILY    levothyroxine (SYNTHROID) 150 MCG tablet TAKE ONE TABLET BY MOUTH BEFORE BREAKFAST   Magnesium 250 MG TABS Take 250 mg by mouth daily.   Multiple Vitamins-Minerals (MULTIVITAMIN WOMEN 50+) TABS Take 1 tablet by mouth daily.   ondansetron (ZOFRAN) 4 MG tablet Take 1 tablet (4 mg total) by mouth every 8 (eight) hours as needed for up to 20 doses for nausea or vomiting.   spironolactone (ALDACTONE) 50 MG tablet TAKE ONE TABLET BY MOUTH ONCE DAILY   traMADol (ULTRAM) 50 MG tablet Take 1 tablet (50 mg total) by mouth every 6 (six) hours as needed for up to 20 doses.   Turmeric 500 MG TABS Take 1,000 mg by mouth.   No facility-administered encounter medications on file as of 12/17/2022.  Reviewed chart for medication changes ahead of medication coordination call.  No hospital visits since last care coordination call/Pharmacist visit.   No medication changes indicated   BP Readings from Last 3 Encounters:  09/24/22 118/80  08/26/22 103/63  06/10/22 135/65    Lab Results  Component Value Date   HGBA1C 5.8 (H) 09/24/2022     Patient obtains medications through Adherence Packaging  90 Days   Last adherence delivery included:  Onetouch test strips- Use as directed Atorvastatin 20 mg - 1 tablet daily at bedtime Levothyroxine 150 mcg- 1 tablet daily before breakfast Spironolactone 50 mg- 1 tablet daily at breakfast Azelastine drops 0.05%- 1 drop in each eye twice daily Lancets  Patient declined (meds) last delivery: Onetouch test strips- Use as directed Atorvastatin 20 mg - 1 tablet daily at bedtime Levothyroxine 150 mcg- 1  tablet daily before breakfast Spironolactone 50 mg- 1 tablet daily at breakfast Azelastine drops 0.05%- 1 drop in each eye twice daily Lancets  Patient is due for next adherence delivery on: 12-28-2022  Called patient and reviewed medications and coordinated delivery.  This delivery to include: Onetouch test strips- Use as directed Atorvastatin 20 mg - 1 tablet  daily at bedtime Levothyroxine 150 mcg- 1 tablet daily before breakfast Spironolactone 50 mg- 1 tablet daily at breakfast Azelastine drops 0.05%- 1 drop in each eye twice daily Lancets  No short/acute fills needed  Patient declined the following medications: None  Patient needs refills for: Sent to PCP Lancets  Confirmed delivery date of 12-28-2022, advised patient that pharmacy will contact them the morning of delivery.  Care Gaps: Covid booster overdue  Ophthalmology exam overdue  Star Rating Drugs: Atorvastatin 20 mg- Last filled 09-24-2022 90 DS upstream. Previous 06-24-2022 90 DS upstream  [place] Pharmacist Assistant [phone]

## 2023-01-11 ENCOUNTER — Ambulatory Visit: Payer: Medicare HMO | Admitting: Clinical

## 2023-01-11 DIAGNOSIS — F4321 Adjustment disorder with depressed mood: Secondary | ICD-10-CM

## 2023-01-11 DIAGNOSIS — R69 Illness, unspecified: Secondary | ICD-10-CM | POA: Diagnosis not present

## 2023-01-11 NOTE — Progress Notes (Signed)
[facility] Counselor/Therapist Progress Note  Patient ID: Nancy [name], MRN: [id number]    Date: 01/11/23  Time Spent: 9:31  am - 10:12 am : 41 Minutes  Treatment Type: Individual Therapy.  Reported Symptoms: Patient stated, "getting better" in response to patient's mood.   Mental Status Exam: Appearance:  Well Groomed     Behavior: Appropriate  Motor: Normal  Speech/Language:  Clear and Coherent  Affect: Appropriate  Mood: normal  Thought process: normal  Thought content:   WNL  Sensory/Perceptual disturbances:   WNL  Orientation: oriented to person, place, and situation  Attention: Good  Concentration: Good  Memory: WNL  Fund of knowledge:  Good  Insight:   Good  Judgment:  Good  Impulse Control: Good   Risk Assessment: Danger to Self:  No Patient denied current suicidal ideation  Self-injurious Behavior: No Danger to Others: No Patient denied current homicidal ideation Duty to Warn:no Physical Aggression / Violence:No  Access to Firearms a concern: No  Gang Involvement:No   Subjective:  Patient stated, "things are basically pretty much the same". Patient stated, "getting better" in response to patient's mood. Patient reported she lost her sister to Rodey and another sister to sepsis. Patient reported she lost both sisters within a two year period. Patient reported she has been feeling isolated. Patient stated, "its good" in response to current mood. Patient reported continued challenges in her relationship with her sister in law. Patient stated, "it sounds exactly like what Im going through" in response to the diagnosis. Patient stated, "I think it's a good idea" in response to recommendation for therapy. Patient stated, "I would love to see what resources they offer online" and stated, "I don't know if Im ready to face other peoples' grief" in response to discussion of grief/loss support group. Patient stated, "I need to work on my weight and to get active  again". Patient stated, "I need some help reconciling what I really want to do". Patient reported she would like to resume baking, exercising, take a cake decorating class, and take college courses in creative writing. Patient reported she would like to write fiction or culinary articles.   Interventions: Motivational Interviewing. Clinician conducted session via WebEx video from clinician's home office. Patient provided verbal consent to proceed with telehealth session and participated in session from patient's home in [place], [place]. Clinician reviewed diagnosis and treatment recommendations. Provided psycho education related to diagnosis and treatment. Clinician utilized motivational interviewing to explore potential goals for therapy.   Diagnosis:  Adjustment disorder with depressed mood   Plan: Patient is to utilize [employer] [facility], thought re-framing, and coping strategies to decrease symptoms associated with their diagnosis. Frequency: bi-weekly  Modality: individual     Long-term goal:   Patient identified a long term goal to enroll in college courses/program for creative writing.  Target Date: to be determined  Progress: 0   Short-term goal:  To be determined during follow up appointment on 01/29/23                      Katherina Right, LCSW

## 2023-01-13 ENCOUNTER — Ambulatory Visit: Payer: Medicare HMO

## 2023-01-15 ENCOUNTER — Telehealth: Payer: Self-pay

## 2023-01-15 NOTE — Progress Notes (Signed)
Care Management & Coordination Services Pharmacy Team  Reason for Encounter: Medication coordination and delivery  Contacted patient to discuss medications and coordinate delivery from [facility]. Spoke with patient on 01/15/2023  Cycle dispensing form sent to Pattricia Boss for review.   Last adherence delivery date: 12-28-2022  Patient is due for next adherence delivery on: 01-27-2023  This delivery to include: Adherence Packaging  30 Days  Spironolactone 50 mg- 1 tablet daily at breakfast  Atorvastatin 20 mg - 1 tablet daily at bedtime  Levothyroxine 150 mcg- 1 tablet daily before breakfast  Azelastine drops 0.05%- 1 drop in each eye twice daily   Patient declined the following medications this month: Lancets- next fill 04-02-2023 Test strips-  next fill 04-02-2023  No refill request needed.  Confirmed delivery date of 01-27-2023, advised patient that pharmacy will contact them the morning of delivery.   Any concerns about your medications? No  How often do you forget or accidentally miss a dose? Never  Do you use a pillbox? No  Is patient in packaging Yes  If yes  What is the date on your next pill pack? 01-16-2023  Any concerns or issues with your packaging? No   Recent blood pressure readings are as follows: 118/80 09-24-2022  Recent blood glucose readings are as follows: 103, 97, 91   Chart review: Recent office visits:  None  Recent consult visits:  01-11-2023 Katherina Right, LCSW ([employer], [employer]). Follow up no changes.  Hospital visits:  None in previous 6 months  Medications: Outpatient Encounter Medications as of 01/15/2023  Medication Sig   atorvastatin (LIPITOR) 20 MG tablet TAKE ONE TABLET BY MOUTH EVERYDAY AT BEDTIME FOR CHOLESTEROL   azelastine (OPTIVAR) 0.05 % ophthalmic solution INSTILL ONE DROP IN [facility] EYE TWICE DAILY Strength: 0.05 %   blood glucose meter kit and supplies Dispense based on patient and insurance preference. Use up to  four times daily as directed. (FOR ICD-10 E10.9, E11.9).   Blood Glucose Monitoring Suppl (ACCU-CHEK GUIDE) w/Device KIT 1 Device by Does not apply route daily.   Blood Pressure Monitor KIT USE TO CHECK BLOOD PRESSURE 110   Cholecalciferol 125 MCG (5000 UT) TABS Take 5,000 Units by mouth daily.   clobetasol (TEMOVATE) 0.05 % external solution Apply to scalp nightly   clobetasol ointment (TEMOVATE) 0.05 % Apply to elbows knees and back behind ears nightly   desoximetasone (TOPICORT) 0.25 % cream Apply to skin daily safe to use on face and skin folds   Flaxseed, Linseed, (FLAXSEED OIL) 1000 MG CAPS Take by mouth.   GLUCOSAMINE CHONDROITIN COMPLX PO Take 1 capsule by mouth daily. 1500 mg/1200 mg   glucose blood (ONETOUCH ULTRA) test strip USE AS INSTRUCTED   Lactobacillus (PROBIOTIC ACIDOPHILUS PO) Take 0.5 mg by mouth daily.   Lancets (ONETOUCH ULTRASOFT) lancets USE TO check blood sugar DAILY   levothyroxine (SYNTHROID) 150 MCG tablet TAKE ONE TABLET BY MOUTH BEFORE BREAKFAST   Magnesium 250 MG TABS Take 250 mg by mouth daily.   Multiple Vitamins-Minerals (MULTIVITAMIN WOMEN 50+) TABS Take 1 tablet by mouth daily.   ondansetron (ZOFRAN) 4 MG tablet Take 1 tablet (4 mg total) by mouth every 8 (eight) hours as needed for up to 20 doses for nausea or vomiting.   spironolactone (ALDACTONE) 50 MG tablet TAKE ONE TABLET BY MOUTH ONCE DAILY   traMADol (ULTRAM) 50 MG tablet Take 1 tablet (50 mg total) by mouth every 6 (six) hours as needed for up to 20 doses.  Turmeric 500 MG TABS Take 1,000 mg by mouth.   No facility-administered encounter medications on file as of 01/15/2023.   BP Readings from Last 3 Encounters:  09/24/22 118/80  08/26/22 103/63  06/10/22 135/65    Pulse Readings from Last 3 Encounters:  09/24/22 70  08/26/22 60  06/10/22 62    Lab Results  Component Value Date/Time   HGBA1C 5.8 (H) 09/24/2022 12:06 PM   HGBA1C 5.6 02/18/2022 12:10 PM   Lab Results  Component Value  Date   CREATININE 1.15 (H) 09/24/2022   BUN 25 09/24/2022   GFRNONAA 53 (L) 06/05/2022   GFRAA 59 (L) 02/12/2021   NA 146 (H) 09/24/2022   K 4.6 09/24/2022   CALCIUM 9.5 09/24/2022   CO2 24 09/24/2022     [facility] Clinical Pharmacist Assistant [phone]

## 2023-01-18 IMAGING — CR DG CHEST 2V
2 series · 2 of 2 positions shown · non-contrast
Comparison: Abdominal radiograph from December 08, 2019. No prior
chest imaging.

CLINICAL DATA: Acute LEFT-sided thoracic pain for 3 days, swollen
legs since surgery for hernia repair.

EXAM:
CHEST - 2 VIEW

[w chest pa]
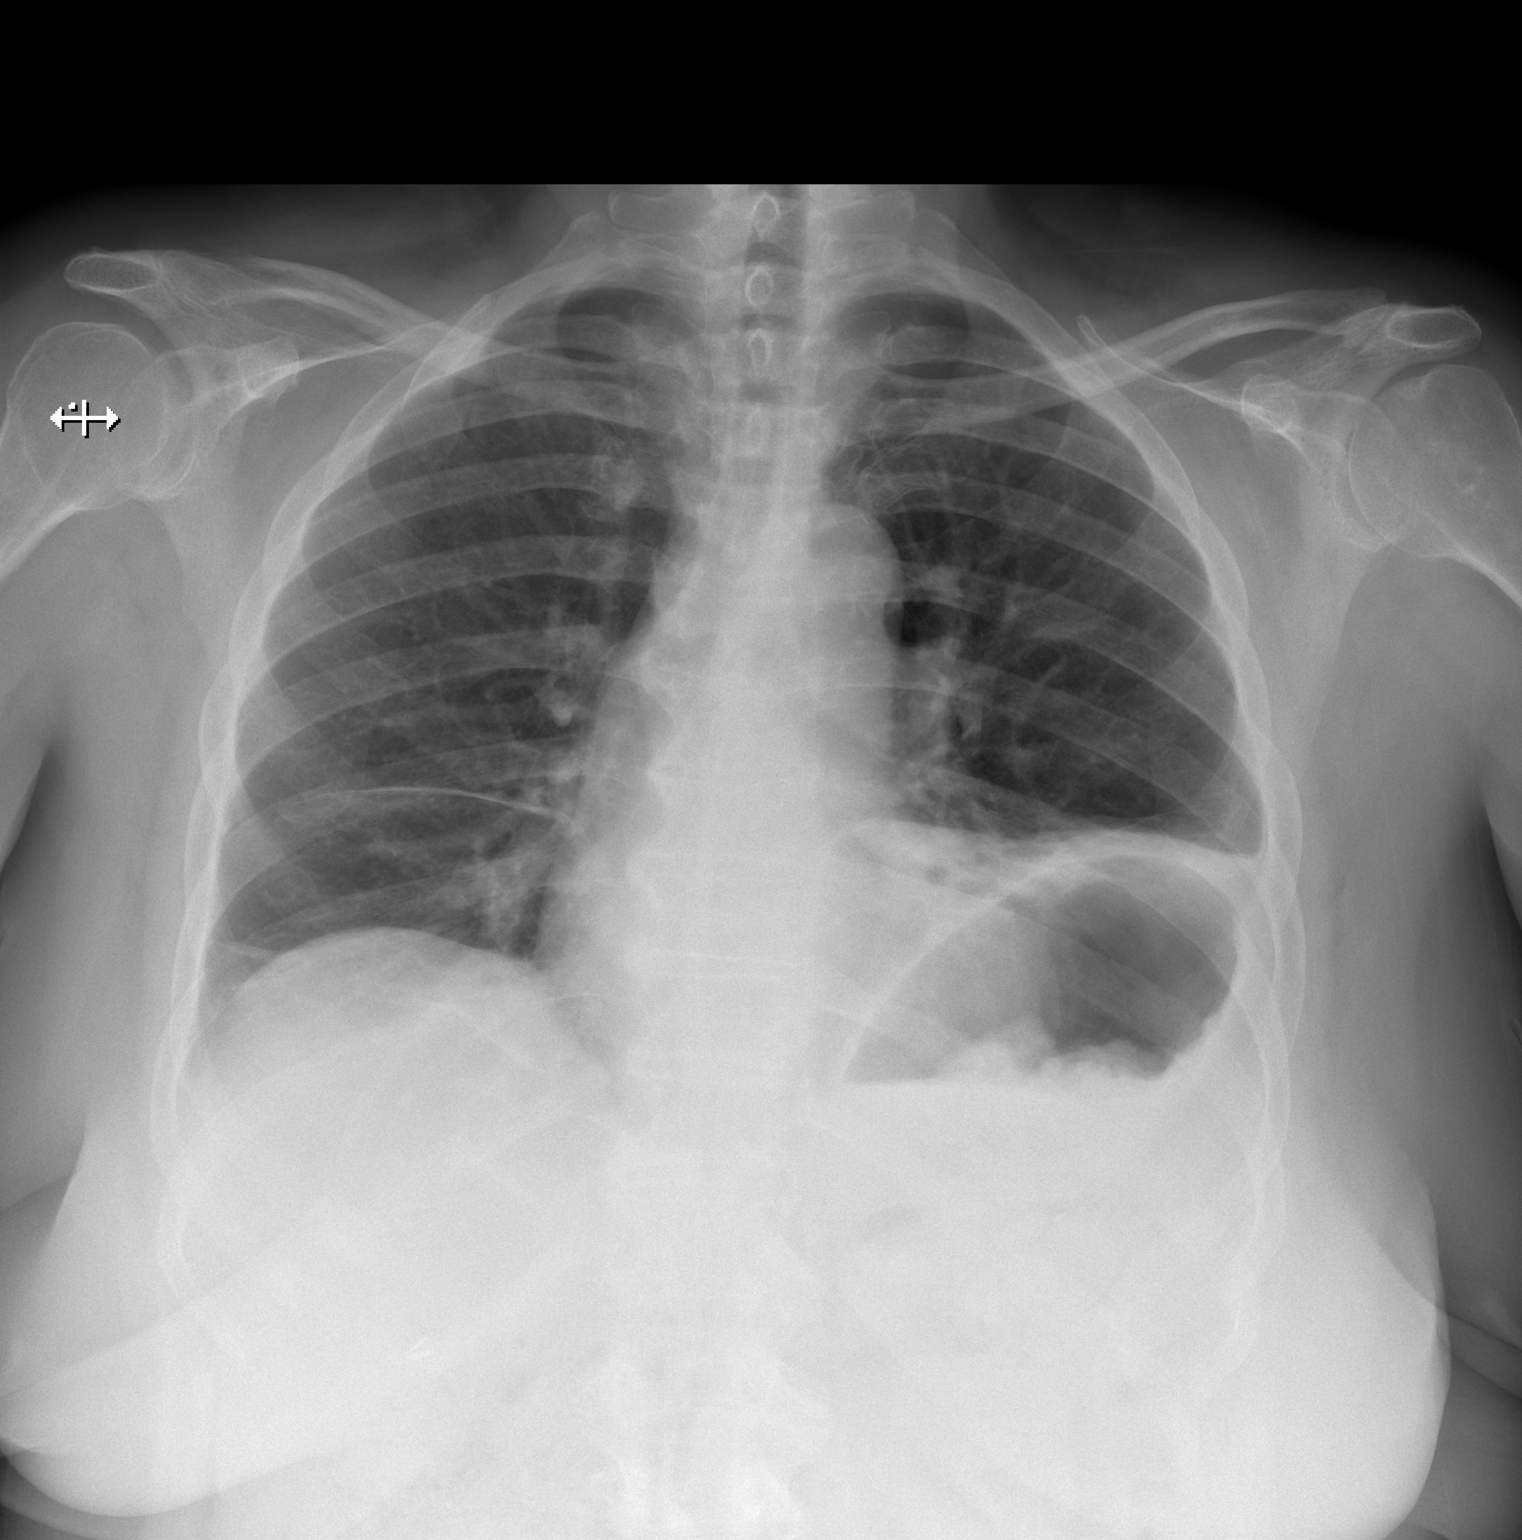

[w chest lat]
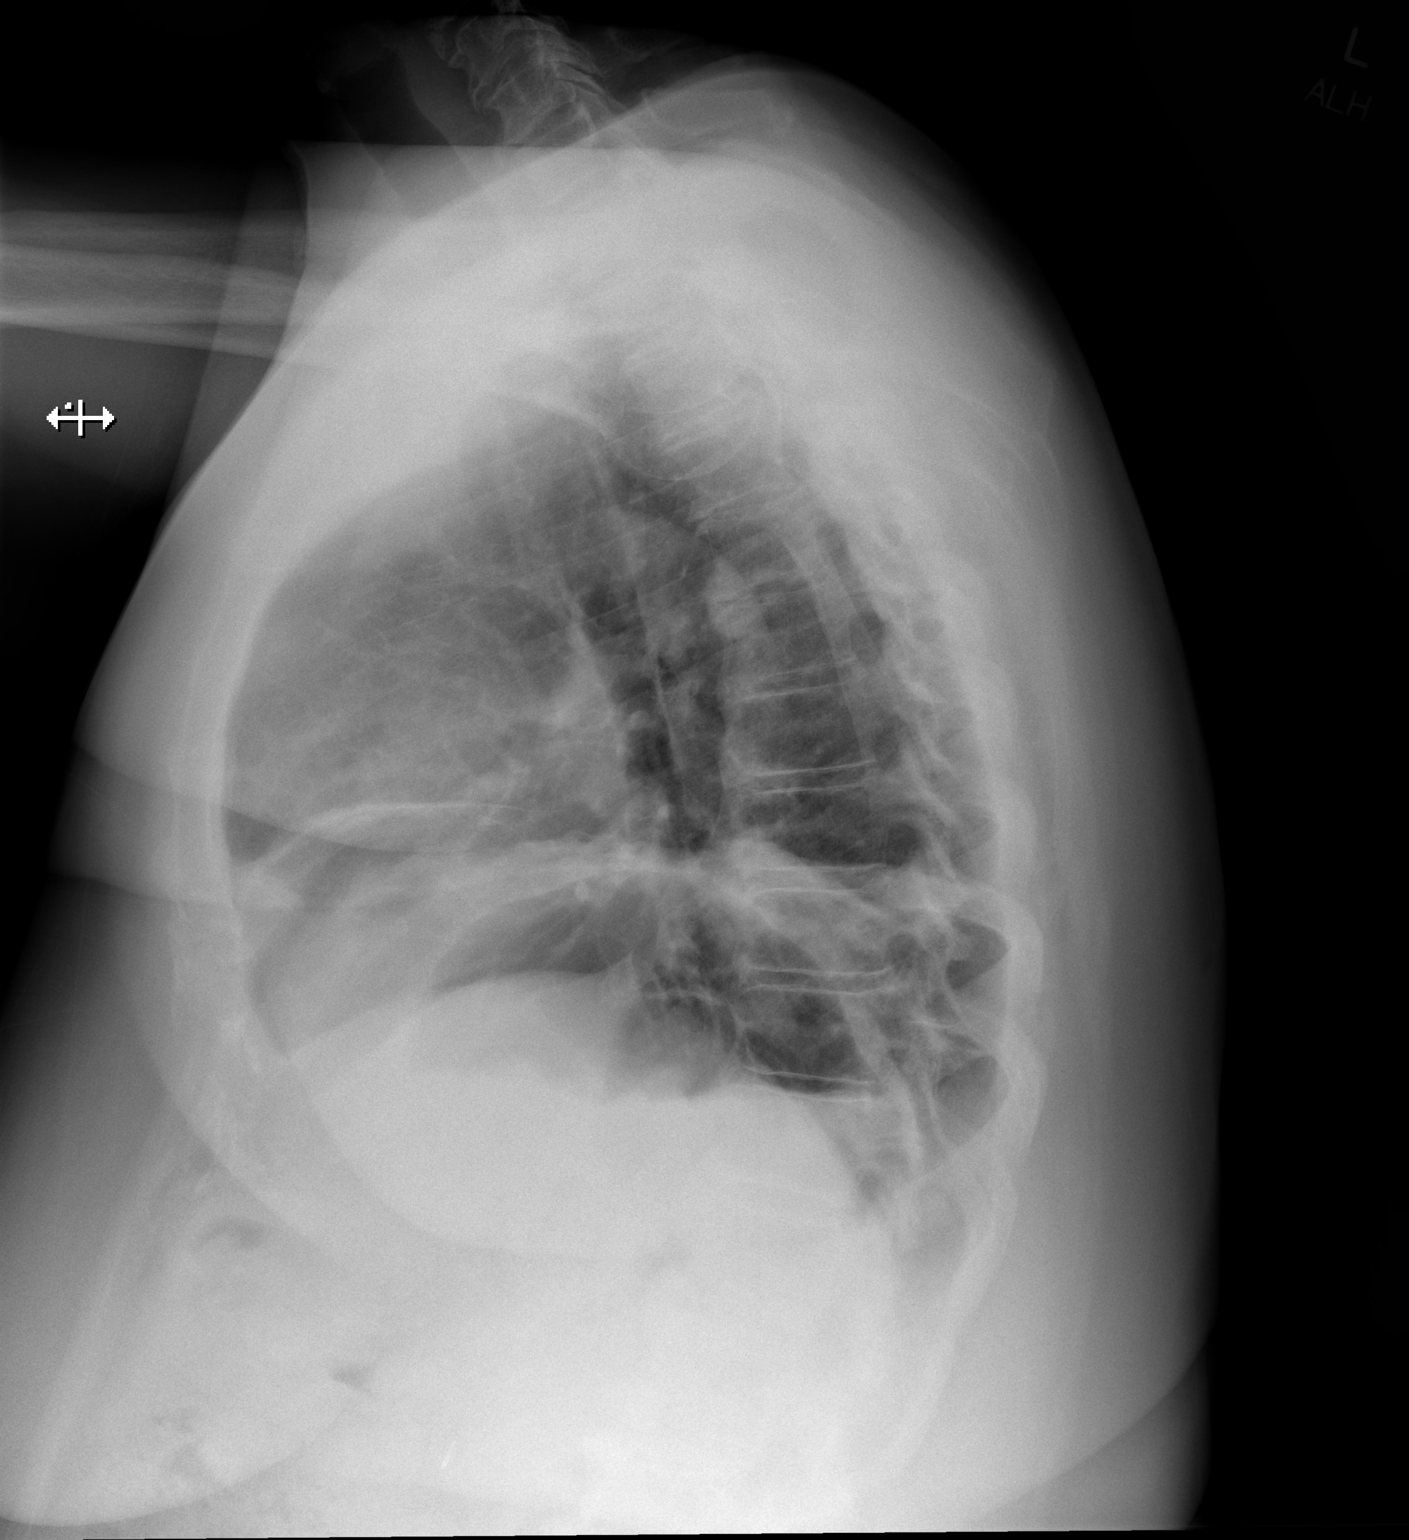

[2 of 2 positions shown; findings below may reference images not displayed]

FINDINGS: Trachea midline.

Cardiomediastinal contours and hilar structures are normal.

Elevated LEFT hemidiaphragm with juxta diaphragmatic and LEFT lower
lobe airspace disease. No prior imaging for comparison. Linear,
bandlike RIGHT lower lobe airspace disease as well. No pneumothorax.

On limited assessment there is no acute skeletal process.
IMPRESSION: Elevated LEFT hemidiaphragm with juxta diaphragmatic and LEFT lower
lobe airspace disease, much of this may represent volume loss though
given the dense nature of this process consolidation/pneumonia is
considered.

Linear, bandlike RIGHT lower lobe airspace disease, favor
atelectasis.

## 2023-01-29 ENCOUNTER — Ambulatory Visit: Payer: Medicare HMO | Admitting: Clinical

## 2023-02-12 ENCOUNTER — Ambulatory Visit: Payer: Medicare HMO | Admitting: Clinical

## 2023-02-15 ENCOUNTER — Telehealth: Payer: Self-pay

## 2023-02-15 DIAGNOSIS — H524 Presbyopia: Secondary | ICD-10-CM | POA: Diagnosis not present

## 2023-02-15 DIAGNOSIS — H52221 Regular astigmatism, right eye: Secondary | ICD-10-CM | POA: Diagnosis not present

## 2023-02-15 NOTE — Progress Notes (Signed)
Care Management & Coordination Services Pharmacy Team  Reason for Encounter: Medication coordination and delivery  Contacted patient to discuss medications and coordinate delivery from [facility]. Spoke with patient on 02/15/2023  Cycle dispensing form sent to Pattricia Boss for review.   Last adherence delivery date: 01-27-2023  Patient is due for next adherence delivery on: 02-25-2023  This delivery to include: Adherence Packaging  30 Days  Spironolactone 50 mg- 1 tablet daily at breakfast  Atorvastatin 20 mg - 1 tablet daily at bedtime  Levothyroxine 150 mcg- 1 tablet daily before breakfast  Azelastine drops 0.05%- 1 drop in each eye twice daily   Patient declined the following medications this month: Lancets- next fill 04-02-2023 Test strips-  next fill 04-02-2023 Zofran- short term use Tramadol- short term use  No refill request needed.  Confirmed delivery date of 02-25-2023, advised patient that pharmacy will contact them the morning of delivery.   Any concerns about your medications? No  How often do you forget or accidentally miss a dose? Never  Do you use a pillbox? No  Is patient in packaging Yes  If yes  What is the date on your next pill pack? 02-16-2023  Any concerns or issues with your packaging? no   Recent blood pressure readings are as follows:Patient hasn't been checking blood pressure at home. Advised patient to check at least twice weekly for future delivery calls.  Recent blood glucose readings are as follows: 02-26 105, 02-25 98   Chart review: Recent office visits:  None  Recent consult visits:  None  Hospital visits:  None in previous 6 months  Medications: Outpatient Encounter Medications as of 02/15/2023  Medication Sig   atorvastatin (LIPITOR) 20 MG tablet TAKE ONE TABLET BY MOUTH EVERYDAY AT BEDTIME FOR CHOLESTEROL   azelastine (OPTIVAR) 0.05 % ophthalmic solution INSTILL ONE DROP IN [facility] EYE TWICE DAILY Strength: 0.05 %    blood glucose meter kit and supplies Dispense based on patient and insurance preference. Use up to four times daily as directed. (FOR ICD-10 E10.9, E11.9).   Blood Glucose Monitoring Suppl (ACCU-CHEK GUIDE) w/Device KIT 1 Device by Does not apply route daily.   Blood Pressure Monitor KIT USE TO CHECK BLOOD PRESSURE 110   Cholecalciferol 125 MCG (5000 UT) TABS Take 5,000 Units by mouth daily.   clobetasol (TEMOVATE) 0.05 % external solution Apply to scalp nightly   clobetasol ointment (TEMOVATE) 0.05 % Apply to elbows knees and back behind ears nightly   desoximetasone (TOPICORT) 0.25 % cream Apply to skin daily safe to use on face and skin folds   Flaxseed, Linseed, (FLAXSEED OIL) 1000 MG CAPS Take by mouth.   GLUCOSAMINE CHONDROITIN COMPLX PO Take 1 capsule by mouth daily. 1500 mg/1200 mg   glucose blood (ONETOUCH ULTRA) test strip USE AS INSTRUCTED   Lactobacillus (PROBIOTIC ACIDOPHILUS PO) Take 0.5 mg by mouth daily.   Lancets (ONETOUCH ULTRASOFT) lancets USE TO check blood sugar DAILY   levothyroxine (SYNTHROID) 150 MCG tablet TAKE ONE TABLET BY MOUTH BEFORE BREAKFAST   Magnesium 250 MG TABS Take 250 mg by mouth daily.   Multiple Vitamins-Minerals (MULTIVITAMIN WOMEN 50+) TABS Take 1 tablet by mouth daily.   ondansetron (ZOFRAN) 4 MG tablet Take 1 tablet (4 mg total) by mouth every 8 (eight) hours as needed for up to 20 doses for nausea or vomiting.   spironolactone (ALDACTONE) 50 MG tablet TAKE ONE TABLET BY MOUTH ONCE DAILY   traMADol (ULTRAM) 50 MG tablet Take 1 tablet (50  mg total) by mouth every 6 (six) hours as needed for up to 20 doses.   Turmeric 500 MG TABS Take 1,000 mg by mouth.   No facility-administered encounter medications on file as of 02/15/2023.   BP Readings from Last 3 Encounters:  09/24/22 118/80  08/26/22 103/63  06/10/22 135/65    Pulse Readings from Last 3 Encounters:  09/24/22 70  08/26/22 60  06/10/22 62    Lab Results  Component Value Date/Time   HGBA1C  5.8 (H) 09/24/2022 12:06 PM   HGBA1C 5.6 02/18/2022 12:10 PM   Lab Results  Component Value Date   CREATININE 1.15 (H) 09/24/2022   BUN 25 09/24/2022   GFRNONAA 53 (L) 06/05/2022   GFRAA 59 (L) 02/12/2021   NA 146 (H) 09/24/2022   K 4.6 09/24/2022   CALCIUM 9.5 09/24/2022   CO2 24 09/24/2022     [facility] Clinical Pharmacist Assistant [phone]

## 2023-02-18 ENCOUNTER — Ambulatory Visit (INDEPENDENT_AMBULATORY_CARE_PROVIDER_SITE_OTHER): Payer: Medicare HMO

## 2023-02-18 VITALS — Ht 65.0 in | Wt 240.0 lb

## 2023-02-18 DIAGNOSIS — Z0000 Encounter for general adult medical examination without abnormal findings: Secondary | ICD-10-CM | POA: Diagnosis not present

## 2023-02-18 NOTE — Patient Instructions (Signed)
Nancy [name] , Thank you for taking time to come for your Medicare Wellness Visit. I appreciate your ongoing commitment to your health goals. Please review the following plan we discussed and let me know if I can assist you in the future.   These are the goals we discussed:  Goals      Patient Stated     01/11/2020, wants to get down to 160-170 pounds     Patient Stated     01/16/2021, wants to continue losing weight     Patient Stated     12/31/2021, wants to lose 10 pounds and keep A1C intact     Patient Stated     02/18/2023, wants to lose weight and limber up, strengthen muscles     Nancy [name] (see longitudinal plan of care for additional care plan information)  Current Barriers:  Chronic Disease Management support, education, and care coordination needs related to Hypertension, Hyperlipidemia, and Prediabetes   Hypertension BP Readings from Last 3 Encounters:  07/09/20 118/68  05/14/20 (!) 104/58  04/10/20 116/80  Pharmacist Clinical Goal(s): Over the next 180 days, patient will work with PharmD and providers to maintain BP goal <140/90 Current regimen:  Spironolactone '50mg'$  daily Interventions: Provided dietary and exercise recommendations Advised patient she could take spironolactone in the morning Determined patient has experienced problems with nighttime urination Discussed that headaches can sometimes be a sign of high blood pressure Patient self care activities - Over the next 180 days, patient will: Check BP periodically and if symptomatic, document, and provide at future appointments Ensure daily salt intake < 2300 mg/day Continue current exercise routine and get at least 30 minutes of exercise 5 times weekly  Hyperlipidemia Lab Results  Component Value Date/Time   LDLCALC 87 07/09/2020 02:21 PM  Pharmacist Clinical Goal(s): Over the next 180 days, patient will work with PharmD and providers to maintain LDL goal < 100 Current  regimen:  Atorvastatin '20mg'$  daily Interventions: Discussed appropriate goals for LDL (less than 100), HDL (greater than 50), and triglycerides (less than 150) Provided dietary and exercise recommendations Patient self care activities - Over the next 180 days, patient will: Continue current exercise routine and get at least 30 minutes of exercise 5 times weekly  Prediabetes Lab Results  Component Value Date/Time   HGBA1C 5.6 07/09/2020 02:21 PM   HGBA1C 5.7 (H) 04/10/2020 02:06 PM  Pharmacist Clinical Goal(s): Over the next 180 days, patient will work with PharmD and providers to maintain A1c goal  < 5.7% Current regimen:  N/A Interventions: Provided dietary and exercise recommendations Discussed the appropriate amount of carbohydrates patient should eat with each meal (45-60 grams per meal, 15 grams per snack) Discussed PLATE method (include information) Recommend patient increase water intake to 64 ounces daily Discussed the effects of exercise to lower Hemoglobin A1c (150 minutes weekly can lower A1c by 0.7%) Patient self care activities - Over the next 180 days, patient will: Check blood sugar once daily, document, and provide at future appointments Contact provider with any episodes of hypoglycemia Continue current exercise routine and get at least 30 minutes of exercise 5 times weekly Limit portion sizes of carbohydrates with each meal Focus on eating well- balanced meals utilizing the PLATE method Increase water intake to 64 ounces daily  Medication management Pharmacist Clinical Goal(s): Over the next 180 days, patient will work with PharmD and providers to maintain optimal medication adherence Current pharmacy: [facility] Interventions Comprehensive medication review  performed. Continue current medication management strategy Discussed recommended vaccine (COVID booster, 2nd dose of Shingrix, and Prevnar13) and timing Reviewed [employer] formulary for cheaper alternative to  azelastine eye drops Determined all antihistamine eye drops are Tier 3 or 4 Discussed checking price of azelastine eye drops using coupon such as GoodRx, checking price without insurance, or trying over the counter allergy eye drops (olopatadine) Advised patient she could check with eye doctor regarding alternatives to azelastine Patient self care activities - Over the next 180 days, patient will: Focus on medication adherence by continued [place] of pill box Take medications as prescribed Report any questions or concerns to PharmD and/or provider(s)  Initial goal documentation      Track and Manage My Blood Pressure-Hypertension     Timeframe:  Long-Range Goal Priority:  High Start Date:                             Expected End Date:                       Follow Up Date 08/07/2022 In Progress:  - check blood pressure 3 times per week - choose a place to take my blood pressure (home, clinic or office, retail store) - write blood pressure results in a log or diary    Why is this important?   You won't feel high blood pressure, but it can still hurt your blood vessels.  High blood pressure can cause heart or kidney problems. It can also cause a stroke.  Making lifestyle changes like losing a little weight or eating less salt will help.  Checking your blood pressure at home and at different times of the day can help to control blood pressure.  If the doctor prescribes medicine remember to take it the way the doctor ordered.  Call the office if you cannot afford the medicine or if there are questions about it.             This is a list of the screening recommended for you and due dates:  Health Maintenance  Topic Date Due   COVID-19 Vaccine (6 - 2023-24 season) 08/21/2022   Yearly kidney health urinalysis for diabetes  02/19/2023   Complete foot exam   02/19/2023   Hemoglobin A1C  03/26/2023   Mammogram  04/24/2023   Yearly kidney function blood test for diabetes  09/25/2023    Eye exam for diabetics  11/11/2023   Medicare Annual Wellness Visit  02/18/2024   Colon Cancer Screening  09/01/2028   DTaP/Tdap/Td vaccine (2 - Td or Tdap) 01/10/2030   Pneumonia Vaccine  Completed   Flu Shot  Completed   DEXA scan (bone density measurement)  Completed   Hepatitis C Screening: USPSTF Recommendation to screen - Ages 4-79 yo.  Completed   Zoster (Shingles) Vaccine  Completed   HPV Vaccine  Aged Out    Advanced directives: Advance directive discussed with you today.   Conditions/risks identified: none  Next appointment: Follow up in one year for your annual wellness visit    Preventive Care 65 Years and Older, Female Preventive care refers to lifestyle choices and visits with your health care provider that can promote health and wellness. What does preventive care include? A yearly physical exam. This is also called an annual well check. Dental exams once or twice a year. Routine eye exams. Ask your health care provider how often you should have your  eyes checked. Personal lifestyle choices, including: Daily care of your teeth and gums. Regular physical activity. Eating a healthy diet. Avoiding tobacco and drug use. Limiting alcohol use. Practicing safe sex. Taking low-dose aspirin every day. Taking vitamin and mineral supplements as recommended by your health care provider. What happens during an annual well check? The services and screenings done by your health care provider during your annual well check will depend on your age, overall health, lifestyle risk factors, and family history of disease. Counseling  Your health care provider may ask you questions about your: Alcohol use. Tobacco use. Drug use. Emotional well-being. Home and relationship well-being. Sexual activity. Eating habits. History of falls. Memory and ability to understand (cognition). Work and work Statistician. Reproductive health. Screening  You may have the following tests or  measurements: Height, weight, and BMI. Blood pressure. Lipid and cholesterol levels. These may be checked every 5 years, or more frequently if you are over 49 years old. Skin check. Lung cancer screening. You may have this screening every year starting at age 64 if you have a 30-pack-year history of smoking and currently smoke or have quit within the past 15 years. Fecal occult blood test (FOBT) of the stool. You may have this test every year starting at age 24. Flexible sigmoidoscopy or colonoscopy. You may have a sigmoidoscopy every 5 years or a colonoscopy every 10 years starting at age 60. Hepatitis C blood test. Hepatitis B blood test. Sexually transmitted disease (STD) testing. Diabetes screening. This is done by checking your blood sugar (glucose) after you have not eaten for a while (fasting). You may have this done every 1-3 years. Bone density scan. This is done to screen for osteoporosis. You may have this done starting at age 1. Mammogram. This may be done every 1-2 years. Talk to your health care provider about how often you should have regular mammograms. Talk with your health care provider about your test results, treatment options, and if necessary, the need for more tests. Vaccines  Your health care provider may recommend certain vaccines, such as: Influenza vaccine. This is recommended every year. Tetanus, diphtheria, and acellular pertussis (Tdap, Td) vaccine. You may need a Td booster every 10 years. Zoster vaccine. You may need this after age 55. Pneumococcal 13-valent conjugate (PCV13) vaccine. One dose is recommended after age 49. Pneumococcal polysaccharide (PPSV23) vaccine. One dose is recommended after age 53. Talk to your health care provider about which screenings and vaccines you need and how often you need them. This information is not intended to replace advice given to you by your health care provider. Make sure you discuss any questions you have with your  health care provider. Document Released: 01/03/2016 Document Revised: 08/26/2016 Document Reviewed: 10/08/2015 Elsevier Interactive Patient Education  2017 [place] Prevention in the Home Falls can cause injuries. They can happen to people of all ages. There are many things you can do to make your home safe and to help prevent falls. What can I do on the outside of my home? Regularly fix the edges of walkways and driveways and fix any cracks. Remove anything that might make you trip as you walk through a door, such as a raised step or threshold. Trim any bushes or trees on the path to your home. Use bright outdoor lighting. Clear any walking paths of anything that might make someone trip, such as rocks or tools. Regularly check to see if handrails are loose or broken. Make sure that both  sides of any steps have handrails. Any raised decks and porches should have guardrails on the edges. Have any leaves, snow, or ice cleared regularly. Use sand or salt on walking paths during winter. Clean up any spills in your garage right away. This includes oil or grease spills. What can I do in the bathroom? Use night lights. Install grab bars by the toilet and in the tub and shower. Do not use towel bars as grab bars. Use non-skid mats or decals in the tub or shower. If you need to sit down in the shower, use a plastic, non-slip stool. Keep the floor dry. Clean up any water that spills on the floor as soon as it happens. Remove soap buildup in the tub or shower regularly. Attach bath mats securely with double-sided non-slip rug tape. Do not have throw rugs and other things on the floor that can make you trip. What can I do in the bedroom? Use night lights. Make sure that you have a light by your bed that is easy to reach. Do not use any sheets or blankets that are too big for your bed. They should not hang down onto the floor. Have a firm chair that has side arms. You can use this for  support while you get dressed. Do not have throw rugs and other things on the floor that can make you trip. What can I do in the kitchen? Clean up any spills right away. Avoid walking on wet floors. Keep items that you use a lot in easy-to-reach places. If you need to reach something above you, use a strong step stool that has a grab bar. Keep electrical cords out of the way. Do not use floor polish or wax that makes floors slippery. If you must use wax, use non-skid floor wax. Do not have throw rugs and other things on the floor that can make you trip. What can I do with my stairs? Do not leave any items on the stairs. Make sure that there are handrails on both sides of the stairs and use them. Fix handrails that are broken or loose. Make sure that handrails are as long as the stairways. Check any carpeting to make sure that it is firmly attached to the stairs. Fix any carpet that is loose or worn. Avoid having throw rugs at the top or bottom of the stairs. If you do have throw rugs, attach them to the floor with carpet tape. Make sure that you have a light switch at the top of the stairs and the bottom of the stairs. If you do not have them, ask someone to add them for you. What else can I do to help prevent falls? Wear shoes that: Do not have high heels. Have rubber bottoms. Are comfortable and fit you well. Are closed at the toe. Do not wear sandals. If you use a stepladder: Make sure that it is fully opened. Do not climb a closed stepladder. Make sure that both sides of the stepladder are locked into place. Ask someone to hold it for you, if possible. Clearly mark and make sure that you can see: Any grab bars or handrails. First and last steps. Where the edge of each step is. Use tools that help you move around (mobility aids) if they are needed. These include: Canes. Walkers. Scooters. Crutches. Turn on the lights when you go into a dark area. Replace any light bulbs as soon  as they burn out. Set up your furniture so you  have a clear path. Avoid moving your furniture around. If any of your floors are uneven, fix them. If there are any pets around you, be aware of where they are. Review your medicines with your doctor. Some medicines can make you feel dizzy. This can increase your chance of falling. Ask your doctor what other things that you can do to help prevent falls. This information is not intended to replace advice given to you by your health care provider. Make sure you discuss any questions you have with your health care provider. Document Released: 10/03/2009 Document Revised: 05/14/2016 Document Reviewed: 01/11/2015 Elsevier Interactive Patient Education  2017 [employer].

## 2023-02-18 NOTE — Progress Notes (Signed)
I connected with  Nancy [name] on 02/18/23 by a audio enabled telemedicine application and verified that I am speaking with the correct person using two identifiers.  Patient Location: Home  Provider Location: Office/Clinic  I discussed the limitations of evaluation and management by telemedicine. The patient expressed understanding and agreed to proceed.  Subjective:   Nancy [name] is a 72 y.o. female who presents for Medicare Annual (Subsequent) preventive examination.  Review of Systems     Cardiac Risk Factors include: advanced age (>29mn, >>52women);dyslipidemia;hypertension;obesity (BMI >30kg/m2)     Objective:    Today's Vitals   02/18/23 1136  Weight: 240 lb (108.9 kg)  Height: '5\' 5"'$  (1.651 m)   Body mass index is 39.94 kg/m.     02/18/2023   11:41 AM 08/26/2022    8:35 AM 06/10/2022   11:47 AM 06/02/2022    5:12 PM 12/31/2021    3:44 PM 10/20/2021    6:12 AM 10/16/2021    1:29 PM  Advanced Directives  Does Patient Have a Medical Advance Directive? No No No No No No No  Would patient like information on creating a medical advance directive?  No - Patient declined No - Patient declined No - Patient declined  No - Patient declined Yes (MAU/Ambulatory/Procedural Areas - Information given)    Current Medications (verified) Outpatient Encounter Medications as of 02/18/2023  Medication Sig   atorvastatin (LIPITOR) 20 MG tablet TAKE ONE TABLET BY MOUTH EVERYDAY AT BEDTIME FOR CHOLESTEROL   azelastine (OPTIVAR) 0.05 % ophthalmic solution INSTILL [facility] TW[facility] DAILY Strength: 0.05 %   blood glucose meter kit and supplies Dispense based on patient and insurance preference. Use up to four times daily as directed. (FOR ICD-10 E10.9, E11.9).   Blood Glucose Monitoring Suppl (ACCU-CHEK GUIDE) w/Device KIT 1 Device by Does not apply route daily.   Blood Pressure Monitor KIT USE TO CHECK BLOOD PRESSURE 110   Cholecalciferol 125 MCG (5000 UT) TABS Take 5,000 Units by  mouth daily.   clobetasol ointment (TEMOVATE) 0.05 % Apply to elbows knees and back behind ears nightly   desoximetasone (TOPICORT) 0.25 % cream Apply to skin daily safe to use on face and skin folds   Flaxseed, Linseed, (FLAXSEED OIL) 1000 MG CAPS Take by mouth.   GLUCOSAMINE CHONDROITIN COMPLX PO Take 1 capsule by mouth daily. 1500 mg/1200 mg   glucose blood (ONETOUCH ULTRA) test strip USE AS INSTRUCTED   Lactobacillus (PROBIOTIC ACIDOPHILUS PO) Take 0.5 mg by mouth daily.   Lancets (ONETOUCH ULTRASOFT) lancets USE TO check blood sugar DAILY   levothyroxine (SYNTHROID) 150 MCG tablet TAKE ONE TABLET BY MOUTH BEFORE BREAKFAST   Magnesium 250 MG TABS Take 250 mg by mouth daily.   Multiple Vitamins-Minerals (MULTIVITAMIN WOMEN 50+) TABS Take 1 tablet by mouth daily.   spironolactone (ALDACTONE) 50 MG tablet TAKE ONE TABLET BY MOUTH ONCE DAILY   clobetasol (TEMOVATE) 0.05 % external solution Apply to scalp nightly (Patient not taking: Reported on 02/18/2023)   ondansetron (ZOFRAN) 4 MG tablet Take 1 tablet (4 mg total) by mouth every 8 (eight) hours as needed for up to 20 doses for nausea or vomiting. (Patient not taking: Reported on 02/18/2023)   traMADol (ULTRAM) 50 MG tablet Take 1 tablet (50 mg total) by mouth every 6 (six) hours as needed for up to 20 doses. (Patient not taking: Reported on 02/18/2023)   Turmeric 500 MG TABS Take 1,000 mg by mouth. (Patient not taking: Reported on 02/18/2023)  No facility-administered encounter medications on file as of 02/18/2023.    Allergies (verified) Patient has no known allergies.   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   Complication of anesthesia    pt states she woke up during a tonsillectomy when she was 28   Hyperlipidemia    Hypertension    Hypothyroidism 1980   Pre-diabetes    Prediabetes    Thyroid disease    Past Surgical History:  Procedure  Laterality Date   BREAST SURGERY     CHOLECYSTECTOMY     LIPOMA EXCISION N/A 06/10/2022   Procedure: EXCISION ABDOMINAL WALL SEROMA;  Surgeon: Wallace Going, DO;  Location: [place];  Service: Plastics;  Laterality: N/A;  [place]   PANNICULECTOMY N/A 10/20/2021   Procedure: PANNICULECTOMY;  Surgeon: Wallace Going, DO;  Location: [facility];  Service: Plastics;  Laterality: N/A;  3.5 hours   REDUCTION MAMMAPLASTY     TONSILLECTOMY     VENTRAL HERNIA REPAIR N/A 10/20/2021   Procedure: HERNIA REPAIR VENTRAL ADULT WITH MESH;  Surgeon: Coralie Keens, MD;  Location: [place];  Service: General;  Laterality: N/A;   Family History  Problem Relation Age of Onset   Diabetes Mother    Hypertension Mother    Hypertension Father    Heart disease Father    Diabetes Sister    Kidney failure Sister    Diabetes Brother    Kidney disease Brother    Diabetes Sister    Diabetes Sister    Asthma Sister    Cancer Maternal Grandfather    Cancer Paternal Grandfather    Social History   Socioeconomic History   Marital status: Unknown    Spouse name: Not on file   Number of children: Not on file   Years of education: Not on file   Highest education level: Not on file  Occupational History   Occupation: retired  Tobacco Use   Smoking status: Former    Types: Cigarettes    Quit date: 2010    Years since quitting: 14.1   Smokeless tobacco: Never  Vaping Use   Vaping Use: Never used  Substance and Sexual Activity   Alcohol use: Yes    Comment: rare   Drug use: Never   Sexual activity: Not Currently  Other Topics Concern   Not on file  Social History Narrative   Not on file   Social Determinants of Health   Financial Resource Strain: Low Risk  (02/18/2023)   Overall Financial Resource Strain (CARDIA)    Difficulty of Paying Living Expenses: Not hard at all  Food Insecurity: No Food Insecurity (02/18/2023)   Hunger Vital Sign     Worried About Running Out of Food in the Last Year: Never true    Ran Out of Food in the Last Year: Never true  Transportation Needs: No Transportation Needs (02/18/2023)   PRAPARE - Hydrologist (Medical): No    Lack of Transportation (Non-Medical): No  Physical Activity: Sufficiently Active (02/18/2023)   Exercise Vital Sign    Days of Exercise per Week: 7 days    Minutes of Exercise per Session: 60 min  Stress: No Stress Concern Present (02/18/2023)   [place]    Feeling of Stress : Only a  little  Social Connections: Not on file    Tobacco Counseling Counseling given: Not Answered   Clinical Intake:  Pre-visit preparation completed: Yes  Pain : No/denies pain     Nutritional Status: BMI > 30  Obese Nutritional Risks: None Diabetes: No  How often do you need to have someone help you when you read instructions, pamphlets, or other written materials from your doctor or pharmacy?: 1 - Never  Diabetic? no  Interpreter Needed?: No  Information entered by :: NAllen LPN   Activities of Daily Living    02/18/2023   11:44 AM 06/10/2022   11:52 AM  In your present state of health, do you have any difficulty performing the following activities:  Hearing? 0 0  Vision? 0 0  Difficulty concentrating or making decisions? 0 0  Walking or climbing stairs? 0 0  Dressing or bathing? 0 0  Doing errands, shopping? 0   Preparing Food and eating ? N   Using the Toilet? N   In the past six months, have you accidently leaked urine? N   Comment only if over slept   Do you have problems with loss of bowel control? N   Managing your Medications? N   Managing your Finances? N   Housekeeping or managing your Housekeeping? N     Patient Care Team: [name], FNP as PCP - General (General Practice) Jerline Pain, MD as PCP - Cardiology (Cardiology) Mayford Knife, [facility]  (Pharmacist) Warren Danes, PA-C as Physician Assistant (Dermatology)  Indicate any recent Medical Services you may have received from other than [facility] providers in the past year (date may be approximate).     Assessment:   This is a routine wellness examination for Nancy [name].  Hearing/Vision screen Vision Screening - Comments:: Regular eye exams, [facility]  Dietary issues and exercise activities discussed: Current Exercise Habits: Home exercise routine, Type of exercise: treadmill, Time (Minutes): 60, Frequency (Times/Week): 7, Weekly Exercise (Minutes/Week): 420   Goals Addressed             This Visit's Progress    Patient Stated       02/18/2023, wants to lose weight and limber up, strengthen muscles       Depression Screen    02/18/2023   11:43 AM 09/24/2022   10:57 AM 12/31/2021    3:45 PM 01/16/2021   11:42 AM 04/10/2020   10:38 AM 01/11/2020   10:34 AM 01/11/2020    9:45 AM  PHQ 2/9 Scores  PHQ - 2 Score 0 0 0 0 0 0 0  PHQ- 9 Score      0     Fall Risk    02/18/2023   11:43 AM 09/24/2022   10:56 AM 12/31/2021    3:44 PM 12/27/2021    9:16 AM 01/16/2021   11:41 AM  [place] in the past year? 0 0 0 0 0  Number falls in past yr: 0 0     Injury with Fall? 0 0     Risk for fall due to : Medication side effect No Fall Risks Medication side effect  Medication side effect  Follow up Falls prevention discussed;Education provided;Falls evaluation completed Falls evaluation completed Falls evaluation completed;Education provided;Falls prevention discussed  Education provided;Falls evaluation completed;Falls prevention discussed    FALL RISK PREVENTION PERTAINING TO THE HOME:  Any stairs in or around the home? No  If so, are there any without handrails? N/a Home free  of loose throw rugs in walkways, pet beds, electrical cords, etc? Yes  Adequate lighting in your home to reduce risk of falls? Yes   ASSISTIVE DEVICES UTILIZED TO PREVENT FALLS:  Life alert?  No  Use of a cane, walker or w/c? No  Grab bars in the bathroom? No  Shower chair or bench in shower? No  Elevated toilet seat or a handicapped toilet? No   TIMED UP AND GO:  Was the test performed? No .      Cognitive Function:        02/18/2023   11:45 AM 12/31/2021    3:45 PM 01/16/2021   11:44 AM 01/11/2020   10:36 AM  6CIT Screen  What Year? 0 points 0 points 0 points 0 points  What month? 0 points 0 points 0 points 0 points  What time? 0 points 0 points 0 points 0 points  Count back from 20 0 points 0 points 0 points 0 points  Months in reverse 0 points 0 points 0 points 0 points  Repeat phrase 2 points 0 points 2 points 0 points  Total Score 2 points 0 points 2 points 0 points    Immunizations Immunization History  Administered Date(s) Administered   Fluad Quad(high Dose 65+) 09/24/2022   Influenza, High Dose Seasonal PF 08/13/2019   Influenza,inj,Quad PF,6+ Mos 08/13/2019   Influenza-Unspecified 09/11/2021   PFIZER Comirnaty(Gray Top)Covid-19 Tri-Sucrose Vaccine 06/12/2021   PFIZER(Purple Top)SARS-COV-2 Vaccination 02/16/2020, 03/13/2020, 10/03/2020, 06/12/2021   PNEUMOCOCCAL CONJUGATE-20 12/26/2021   Pfizer Covid-19 Vaccine Bivalent Booster 6yr & up 10/12/2021   Tdap 01/11/2020   Zoster Recombinat (Shingrix) 08/29/2020, 11/06/2020    TDAP status: Up to date  Flu Vaccine status: Up to date  Pneumococcal vaccine status: Up to date  Covid-19 vaccine status: Completed vaccines  Qualifies for Shingles Vaccine? Yes   Zostavax completed Yes   Shingrix Completed?: Yes  Screening Tests Health Maintenance  Topic Date Due   COVID-19 Vaccine (6 - 2023-24 season) 08/21/2022   Medicare Annual Wellness (AWV)  12/31/2022   Diabetic kidney evaluation - Urine ACR  02/19/2023   FOOT EXAM  02/19/2023   HEMOGLOBIN A1C  03/26/2023   MAMMOGRAM  04/24/2023   Diabetic kidney evaluation - eGFR measurement  09/25/2023   OPHTHALMOLOGY EXAM  11/11/2023   COLONOSCOPY  (Pts [phone] coverage will need to be confirmed)  09/01/2028   DTaP/Tdap/Td (2 - Td or Tdap) 01/10/2030   Pneumonia Vaccine 6547Years old  Completed   INFLUENZA VACCINE  Completed   DEXA SCAN  Completed   Hepatitis C Screening  Completed   Zoster Vaccines- Shingrix  Completed   HPV VACCINES  Aged Out    Health Maintenance  Health Maintenance Due  Topic Date Due   COVID-19 Vaccine (6 - 2023-24 season) 08/21/2022   Medicare Annual Wellness (AWV)  12/31/2022   Diabetic kidney evaluation - Urine ACR  02/19/2023    Colorectal cancer screening: Type of screening: Colonoscopy. Completed 09/01/2018. Repeat every 10 years  Mammogram status: Completed 04/23/2021. Repeat every year  Bone Density status: Completed 10/14/2020.   Lung Cancer Screening: (Low Dose CT Chest recommended if Age 72-80ears, 30 pack-year currently smoking OR have quit w/in 15years.) does not qualify.   Lung Cancer Screening Referral: no  Additional Screening:  Hepatitis C Screening: does qualify; Completed 02/12/2021  Vision Screening: Recommended annual ophthalmology exams for early detection of glaucoma and other disorders of the eye. Is the patient up to date with their annual  eye exam?  Yes  Who is the provider or what is the name of the office in which the patient attends annual eye exams? [facility] If pt is not established with a provider, would they like to be referred to a provider to establish care? No .   Dental Screening: Recommended annual dental exams for proper oral hygiene  Community Resource Referral / Chronic Care Management: CRR required this visit?  No   CCM required this visit?  No      Plan:     I have personally reviewed and noted the following in the patient's chart:   Medical and social history Use of alcohol, tobacco or illicit drugs  Current medications and supplements including opioid prescriptions. Patient is not currently taking opioid  prescriptions. Functional ability and status Nutritional status Physical activity Advanced directives List of other physicians Hospitalizations, surgeries, and ER visits in previous 12 months Vitals Screenings to include cognitive, depression, and falls Referrals and appointments  In addition, I have reviewed and discussed with patient certain preventive protocols, quality metrics, and best practice recommendations. A written personalized care plan for preventive services as well as general preventive health recommendations were provided to patient.     Kellie Simmering, LPN   [id number]   Nurse Notes: none  Due to this being a virtual visit, the after visit summary with patients personalized plan was offered to patient via mail or my-chart. Patient would like to access on my-chart

## 2023-02-24 ENCOUNTER — Encounter: Payer: Medicare HMO | Admitting: Nurse Practitioner

## 2023-03-16 ENCOUNTER — Telehealth: Payer: Self-pay

## 2023-03-16 NOTE — Progress Notes (Signed)
Care Management & Coordination Services Pharmacy Team  Reason for Encounter: Medication coordination and delivery  Contacted patient to discuss medications and coordinate delivery from [facility]. Spoke with patient on 03/16/2023  Cycle dispensing form sent to Pattricia Boss for review.   Last adherence delivery date: 02-25-2023   Patient is due for next adherence delivery on: 03-23-2023  This delivery to include: Adherence Packaging  30 Days  Spironolactone 50 mg- 1 tablet daily at breakfast  Atorvastatin 20 mg - 1 tablet daily at bedtime  Levothyroxine 150 mcg- 1 tablet daily before breakfast  Azelastine drops 0.05%- 1 drop in each eye twice daily  Test strips  Lancets   Patient declined the following medications this month: Zofran- short term use Tramadol- short term use  No refill request needed.  Confirmed delivery date of 03-23-2023, advised patient that pharmacy will contact them the morning of delivery.   Any concerns about your medications? No  How often do you forget or accidentally miss a dose? Never  Do you use a pillbox? No  Is patient in packaging Yes  If yes  What is the date on your next pill pack? 03-17-2023  Any concerns or issues with your packaging? no   Recent blood pressure readings are as follows: None  Recent blood glucose readings are as follows: 106, 102   Chart review: Recent office visits:  02-18-2023 Kellie Simmering, LPN. Medicare wellness visit.  Recent consult visits:  None  Hospital visits:  None in previous 6 months  Medications: Outpatient Encounter Medications as of 03/16/2023  Medication Sig   atorvastatin (LIPITOR) 20 MG tablet TAKE ONE TABLET BY MOUTH EVERYDAY AT BEDTIME FOR CHOLESTEROL   azelastine (OPTIVAR) 0.05 % ophthalmic solution INSTILL ONE DROP IN [facility] EYE TWICE DAILY Strength: 0.05 %   blood glucose meter kit and supplies Dispense based on patient and insurance preference. Use up to four times daily as  directed. (FOR ICD-10 E10.9, E11.9).   Blood Glucose Monitoring Suppl (ACCU-CHEK GUIDE) w/Device KIT 1 Device by Does not apply route daily.   Blood Pressure Monitor KIT USE TO CHECK BLOOD PRESSURE 110   Cholecalciferol 125 MCG (5000 UT) TABS Take 5,000 Units by mouth daily.   clobetasol (TEMOVATE) 0.05 % external solution Apply to scalp nightly (Patient not taking: Reported on 02/18/2023)   clobetasol ointment (TEMOVATE) 0.05 % Apply to elbows knees and back behind ears nightly   desoximetasone (TOPICORT) 0.25 % cream Apply to skin daily safe to use on face and skin folds   Flaxseed, Linseed, (FLAXSEED OIL) 1000 MG CAPS Take by mouth.   GLUCOSAMINE CHONDROITIN COMPLX PO Take 1 capsule by mouth daily. 1500 mg/1200 mg   glucose blood (ONETOUCH ULTRA) test strip USE AS INSTRUCTED   Lactobacillus (PROBIOTIC ACIDOPHILUS PO) Take 0.5 mg by mouth daily.   Lancets (ONETOUCH ULTRASOFT) lancets USE TO check blood sugar DAILY   levothyroxine (SYNTHROID) 150 MCG tablet TAKE ONE TABLET BY MOUTH BEFORE BREAKFAST   Magnesium 250 MG TABS Take 250 mg by mouth daily.   Multiple Vitamins-Minerals (MULTIVITAMIN WOMEN 50+) TABS Take 1 tablet by mouth daily.   ondansetron (ZOFRAN) 4 MG tablet Take 1 tablet (4 mg total) by mouth every 8 (eight) hours as needed for up to 20 doses for nausea or vomiting. (Patient not taking: Reported on 02/18/2023)   spironolactone (ALDACTONE) 50 MG tablet TAKE ONE TABLET BY MOUTH ONCE DAILY   traMADol (ULTRAM) 50 MG tablet Take 1 tablet (50 mg total) by mouth every  6 (six) hours as needed for up to 20 doses. (Patient not taking: Reported on 02/18/2023)   Turmeric 500 MG TABS Take 1,000 mg by mouth. (Patient not taking: Reported on 02/18/2023)   No facility-administered encounter medications on file as of 03/16/2023.   BP Readings from Last 3 Encounters:  09/24/22 118/80  08/26/22 103/63  06/10/22 135/65    Pulse Readings from Last 3 Encounters:  09/24/22 70  08/26/22 60  06/10/22  62    Lab Results  Component Value Date/Time   HGBA1C 5.8 (H) 09/24/2022 12:06 PM   HGBA1C 5.6 02/18/2022 12:10 PM   Lab Results  Component Value Date   CREATININE 1.15 (H) 09/24/2022   BUN 25 09/24/2022   GFRNONAA 53 (L) 06/05/2022   GFRAA 59 (L) 02/12/2021   NA 146 (H) 09/24/2022   K 4.6 09/24/2022   CALCIUM 9.5 09/24/2022   CO2 24 09/24/2022     [place] Clinical Pharmacist Assistant [phone]

## 2023-04-15 ENCOUNTER — Telehealth: Payer: Self-pay

## 2023-04-15 NOTE — Progress Notes (Signed)
Care Management & Coordination Services Pharmacy Team  Reason for Encounter: Medication coordination and delivery  Contacted patient to discuss medications and coordinate delivery from [facility] pharmacy. 04/15/2023- LMTRC 04/16/2023- LMTRC Spoke with patient on 04/19/2023  Cycle dispensing form sent to [facility] for dispensing no CPP review needed.   Last adherence delivery date:03/23/2023      Patient is due for next adherence delivery on: 04/27/2023  This delivery to include: Adherence Packaging  30 Days  Spironolactone 50 mg- 1 tablet daily at breakfast  Atorvastatin 20 mg - 1 tablet daily at bedtime  Levothyroxine 150 mcg- 1 tablet daily before breakfast    Patient declined the following medications this month: Azelastine drops 0.05%- 1 drop in each eye twice daily  No refill request needed.  Confirmed delivery date of 04/27/2023, advised patient that pharmacy will contact them the morning of delivery.   Any concerns about your medications? No  How often do you forget or accidentally miss a dose? Never  Do you use a pillbox? No  Is patient in packaging Yes  Recent blood pressure readings are as follows:144/56 on 10/21/2022- patient has a f/u visit with PCP on 06/08/2023   Chart review: Recent office visits:  None since last care coordination call  Recent consult visits:  None since last care coordination call  Hospital visits:  None in previous 6 months  Medications: Outpatient Encounter Medications as of 04/15/2023  Medication Sig   atorvastatin (LIPITOR) 20 MG tablet TAKE ONE TABLET BY MOUTH EVERYDAY AT BEDTIME FOR CHOLESTEROL   azelastine (OPTIVAR) 0.05 % ophthalmic solution INSTILL ONE DROP IN EACH EYE TWICE DAILY Strength: 0.05 %   blood glucose meter kit and supplies Dispense based on patient and insurance preference. Use up to four times daily as directed. (FOR ICD-10 E10.9, E11.9).   Blood Glucose Monitoring Suppl (ACCU-CHEK GUIDE) w/Device KIT 1 Device by Does  not apply route daily.   Blood Pressure Monitor KIT USE TO CHECK BLOOD PRESSURE 110   Cholecalciferol 125 MCG (5000 UT) TABS Take 5,000 Units by mouth daily.   clobetasol (TEMOVATE) 0.05 % external solution Apply to scalp nightly (Patient not taking: Reported on 02/18/2023)   clobetasol ointment (TEMOVATE) 0.05 % Apply to elbows knees and back behind ears nightly   desoximetasone (TOPICORT) 0.25 % cream Apply to skin daily safe to use on face and skin folds   Flaxseed, Linseed, (FLAXSEED OIL) 1000 MG CAPS Take by mouth.   GLUCOSAMINE CHONDROITIN COMPLX PO Take 1 capsule by mouth daily. 1500 mg/1200 mg   glucose blood (ONETOUCH ULTRA) test strip USE AS INSTRUCTED   Lactobacillus (PROBIOTIC ACIDOPHILUS PO) Take 0.5 mg by mouth daily.   Lancets (ONETOUCH ULTRASOFT) lancets USE TO check blood sugar DAILY   levothyroxine (SYNTHROID) 150 MCG tablet TAKE ONE TABLET BY MOUTH BEFORE BREAKFAST   Magnesium 250 MG TABS Take 250 mg by mouth daily.   Multiple Vitamins-Minerals (MULTIVITAMIN WOMEN 50+) TABS Take 1 tablet by mouth daily.   ondansetron (ZOFRAN) 4 MG tablet Take 1 tablet (4 mg total) by mouth every 8 (eight) hours as needed for up to 20 doses for nausea or vomiting. (Patient not taking: Reported on 02/18/2023)   spironolactone (ALDACTONE) 50 MG tablet TAKE ONE TABLET BY MOUTH ONCE DAILY   traMADol (ULTRAM) 50 MG tablet Take 1 tablet (50 mg total) by mouth every 6 (six) hours as needed for up to 20 doses. (Patient not taking: Reported on 02/18/2023)   Turmeric 500 MG TABS Take 1,000 mg  by mouth. (Patient not taking: Reported on 02/18/2023)   No facility-administered encounter medications on file as of 04/15/2023.   BP Readings from Last 3 Encounters:  09/24/22 118/80  08/26/22 103/63  06/10/22 135/65    Pulse Readings from Last 3 Encounters:  09/24/22 70  08/26/22 60  06/10/22 62    Lab Results  Component Value Date/Time   HGBA1C 5.8 (H) 09/24/2022 12:06 PM   HGBA1C 5.6 02/18/2022 12:10 PM    Lab Results  Component Value Date   CREATININE 1.15 (H) 09/24/2022   BUN 25 09/24/2022   GFRNONAA 53 (L) 06/05/2022   GFRAA 59 (L) 02/12/2021   NA 146 (H) 09/24/2022   K 4.6 09/24/2022   CALCIUM 9.5 09/24/2022   CO2 24 09/24/2022     Billee Cashing, CMA Clinical Pharmacist Assistant [phone]

## 2023-05-14 ENCOUNTER — Telehealth: Payer: Self-pay

## 2023-05-14 ENCOUNTER — Ambulatory Visit: Payer: Medicare HMO | Admitting: Clinical

## 2023-05-14 DIAGNOSIS — F4321 Adjustment disorder with depressed mood: Secondary | ICD-10-CM | POA: Diagnosis not present

## 2023-05-14 NOTE — Progress Notes (Signed)
Care Management & Coordination Services Pharmacy Team  Reason for Encounter: Medication coordination and delivery  Contacted patient to discuss medications and coordinate delivery from [facility]. Spoke with patient on 05/14/2023  Cycle dispensing form sent to [employer] for review.   Last adherence delivery date: 04-27-2023  Patient is due for next adherence delivery on: 05-26-2023  This delivery to include: Adherence Packaging  30 Days  Spironolactone 50 mg- 1 tablet daily at breakfast  Atorvastatin 20 mg - 1 tablet daily at bedtime  Levothyroxine 150 mcg- 1 tablet daily before breakfast   Patient declined the following medications this month: Azelastine - Not needed  No refill request needed.  Confirmed delivery date of 05-26-2023, advised patient that pharmacy will contact them the morning of delivery.   Any concerns about your medications? No  How often do you forget or accidentally miss a dose? Never  Do you use a pillbox? No  Is patient in packaging Yes  Any concerns or issues with your packaging? No   Recent blood pressure readings are as follows: None available    Chart review: Recent office visits:  None  Recent consult visits:  None  Hospital visits:  None in previous 6 months  Medications: Outpatient Encounter Medications as of 05/14/2023  Medication Sig   atorvastatin (LIPITOR) 20 MG tablet TAKE ONE TABLET BY MOUTH EVERYDAY AT BEDTIME FOR CHOLESTEROL   azelastine (OPTIVAR) 0.05 % ophthalmic solution INSTILL ONE DROP IN [facility] EYE TWICE DAILY Strength: 0.05 %   blood glucose meter kit and supplies Dispense based on patient and insurance preference. Use up to four times daily as directed. (FOR ICD-10 E10.9, E11.9).   Blood Glucose Monitoring Suppl (ACCU-CHEK GUIDE) w/Device KIT 1 Device by Does not apply route daily.   Blood Pressure Monitor KIT USE TO CHECK BLOOD PRESSURE 110   Cholecalciferol 125 MCG (5000 UT) TABS Take 5,000 Units by mouth  daily.   clobetasol (TEMOVATE) 0.05 % external solution Apply to scalp nightly (Patient not taking: Reported on 02/18/2023)   clobetasol ointment (TEMOVATE) 0.05 % Apply to elbows knees and back behind ears nightly   desoximetasone (TOPICORT) 0.25 % cream Apply to skin daily safe to use on face and skin folds   Flaxseed, Linseed, (FLAXSEED OIL) 1000 MG CAPS Take by mouth.   GLUCOSAMINE CHONDROITIN COMPLX PO Take 1 capsule by mouth daily. 1500 mg/1200 mg   glucose blood (ONETOUCH ULTRA) test strip USE AS INSTRUCTED   Lactobacillus (PROBIOTIC ACIDOPHILUS PO) Take 0.5 mg by mouth daily.   Lancets (ONETOUCH ULTRASOFT) lancets USE TO check blood sugar DAILY   levothyroxine (SYNTHROID) 150 MCG tablet TAKE ONE TABLET BY MOUTH BEFORE BREAKFAST   Magnesium 250 MG TABS Take 250 mg by mouth daily.   Multiple Vitamins-Minerals (MULTIVITAMIN WOMEN 50+) TABS Take 1 tablet by mouth daily.   ondansetron (ZOFRAN) 4 MG tablet Take 1 tablet (4 mg total) by mouth every 8 (eight) hours as needed for up to 20 doses for nausea or vomiting. (Patient not taking: Reported on 02/18/2023)   spironolactone (ALDACTONE) 50 MG tablet TAKE ONE TABLET BY MOUTH ONCE DAILY   traMADol (ULTRAM) 50 MG tablet Take 1 tablet (50 mg total) by mouth every 6 (six) hours as needed for up to 20 doses. (Patient not taking: Reported on 02/18/2023)   Turmeric 500 MG TABS Take 1,000 mg by mouth. (Patient not taking: Reported on 02/18/2023)   No facility-administered encounter medications on file as of 05/14/2023.   BP Readings from Last  3 Encounters:  09/24/22 118/80  08/26/22 103/63  06/10/22 135/65    Pulse Readings from Last 3 Encounters:  09/24/22 70  08/26/22 60  06/10/22 62    Lab Results  Component Value Date/Time   HGBA1C 5.8 (H) 09/24/2022 12:06 PM   HGBA1C 5.6 02/18/2022 12:10 PM   Lab Results  Component Value Date   CREATININE 1.15 (H) 09/24/2022   BUN 25 09/24/2022   GFRNONAA 53 (L) 06/05/2022   GFRAA 59 (L) 02/12/2021    NA 146 (H) 09/24/2022   K 4.6 09/24/2022   CALCIUM 9.5 09/24/2022   CO2 24 09/24/2022   Malecca Hicks CMA Clinical Pharmacist Assistant [phone]

## 2023-05-14 NOTE — Progress Notes (Signed)
[facility] Counselor/Therapist Progress Note  Patient ID: Nancy [name], MRN: [id number],    Date: 05/14/2023  Time Spent: 2:32pm - 3:12pm : 40 minutes   Treatment Type: Individual Therapy  Reported Symptoms: Patient reported depressed mood, sadness, decreased concentration, low energy, difficulty staying asleep, and eating when patient doesn't feel hungry.  Mental Status Exam: Appearance:  Well Groomed     Behavior: Appropriate  Motor: Normal  Speech/Language:  Clear and Coherent  Affect: Appropriate  Mood: depressed  Thought process: normal  Thought content:   WNL  Sensory/Perceptual disturbances:   WNL  Orientation: oriented to person, place, and situation  Attention: Good  Concentration: Good  Memory: WNL  Fund of knowledge:  Good  Insight:   Fair  Judgment:  Good  Impulse Control: Good   Risk Assessment: Danger to Self:  No Patient denied current suicidal ideation Self-injurious Behavior: No Danger to Others: No Patient denied current homicidal ideation Duty to Warn:no Physical Aggression / Violence:No  Access to Firearms a concern: No  Gang Involvement:No   Subjective: Patient reported her sister died on 02/15/2023. Patient reported she had planned to travel to attend her sister's services but the services were delayed. Patient reported her sister died as a result of COVID. Patient reported she did not attend her sister's celebration of life service due to fear of illness. Patient reported she lost a sister at the beginning of the COVID pandemic and reported a fear of contracting COVID.  Patient reported her friend recently brought her friend's grandchildren to patient's home and the children had a cold. Patient reported after their visit patient developed symptoms. Patient reported her friend was aware she did not travel to attend her sister's service for fear of becoming ill. Patient reported feeling hurt by her friend's behaviors. Patient stated,  "not so good" in response to patient's emotional wellbeing. Patient stated "there's no joy". Patient reported depressed mood, sadness, decreased concentration, low energy, difficulty staying asleep, and eating when patient doesn't feel hungry. Patient reported her niece feels guilty in response to patient's sister's death. Patient reported she doesn't understand why her niece would feel guilty in response to patient's sister's death. Patient reported she has maintained contact with family and has been taking time for herself to cope with the loss of her sister.   Interventions: Cognitive Behavioral Therapy and supportive therapy. Clinician conducted session via caregility video from clinician's home office. Patient provided verbal consent to proceed with telehealth session and participated in session from patient's home in [place], [place]. Reviewed events since last session. Provided supportive therapy, active and reflective listening, and validation as patient discussed the recent loss of patient's sister and patient's thoughts/feelings in response. Assessed patient's mood and symptoms. Provided psycho education related to grief. Discussed ways in which patient has been coping with the loss of her sister. Clinician requested for homework patient identify potential goals for therapy.    Diagnosis:  Adjustment disorder with depressed mood     Plan: Patient is to utilize [employer] Therapy, thought re-framing, and coping strategies to decrease symptoms associated with their diagnosis. Frequency: bi-weekly  Modality: individual      Long-term goal:   Patient identified a long term goal to enroll in college courses/program for creative writing.  Target Date: to be determined  Progress: 0    Short-term goal:  To be determined during follow up appointment on 06/04/23.  Doree Barthel, LCSW

## 2023-05-14 NOTE — Progress Notes (Signed)
                Nancy Nicotra, LCSW 

## 2023-06-04 ENCOUNTER — Ambulatory Visit: Payer: Medicare HMO | Admitting: Clinical

## 2023-06-04 DIAGNOSIS — F4321 Adjustment disorder with depressed mood: Secondary | ICD-10-CM | POA: Diagnosis not present

## 2023-06-04 NOTE — Progress Notes (Signed)
                Skyeler Smola, LCSW 

## 2023-06-04 NOTE — Progress Notes (Signed)
[facility] Counselor/Therapist Progress Note  Patient ID: Nancy [name], MRN: [id number],    Date: 06/04/2023  Time Spent: 2:31pm - 3:18pm : 47 minutes   Treatment Type: Individual Therapy  Reported Symptoms: Patient reported procrastination  Mental Status Exam: Appearance:  Neat and Well Groomed     Behavior: Appropriate  Motor: Normal  Speech/Language:  Clear and Coherent  Affect: Appropriate  Mood: normal  Thought process: normal  Thought content:   WNL  Sensory/Perceptual disturbances:   WNL  Orientation: oriented to person, place, and situation  Attention: Good  Concentration: Good  Memory: WNL  Fund of knowledge:  Good  Insight:   Good  Judgment:  Good  Impulse Control: Fair   Risk Assessment: Danger to Self:  No Patient denied current suicidal ideation  Self-injurious Behavior: No Danger to Others: No Patient denied current homicidal ideation Duty to Warn:no Physical Aggression / Violence:No  Access to Firearms a concern: No  Gang Involvement:No   Subjective: Patient stated, "things have been going really well" and stated, "my sister's kids are better adjusted now".  Patient reported she maintains contact with her sister's children and reported she doesn't want her nieces and nephews to feel abandoned. Patient stated, "I've been doing better" in response to mood since last session. Patient stated, "everything hits you all over again" when she wakes up in the morning and reported improvement in mood throughout the day. Patient reported using the treadmill in the mornings elevates her mood. Patient reported weight loss is one of patient's goals and patient reported recent weight gain. Patient stated, "my mood literally changes when I eat". Patient reported she would like to participate in a creative writing course. 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 Patient reported difficulty establishing and maintaining boundaries  with others.   Interventions: Motivational Interviewing. Clinician conducted session via caregility video from clinician's home office. Patient provided verbal consent to proceed with telehealth session and is aware of limitations of telephone or video visits. Patient participated in session from patient's home. Assessed status of recent stressors and patient's mood since last session. Clinician utilized motivational interviewing to explore potential goals for therapy. Clinician utilized a task centered approach in collaboration with patient to develop goals for therapy. Patient participated in development of goals and agreed to goals for therapy.   Collaboration of Care: Other Patient declined consent for patient's providers   Patient/Guardian was advised Release of Information must be obtained prior to any record release in order to collaborate their care with an outside provider. Patient/Guardian was advised if they have not already done so to contact [facility] to sign all necessary forms in order for [place] to release information regarding their care.   Diagnosis:  Adjustment disorder with depressed mood     Plan: Patient is to utilize [employer] Therapy, thought re-framing, behavioral activation, and coping strategies to decrease symptoms associated with their diagnosis. Frequency: bi-weekly  Modality: individual      Long-term goal:   Patient identified a long term goal to enroll in college courses/program for creative writing.  Target Date: 06/03/24  Progress: 0    Short-term goal:  Develop and implement coping strategies to decrease food intake when feeling frustrated, in response to interpersonal conflict, or in response to feelings of isolation   Target Date: 12/04/23  Progress: progressing   Identify and increase patient's participation in activities patient enjoys, such as, pursuing culinary interests, travel, knitting, crocheting, learning to  draw Target Date: 12/04/23  Progress: progressing   Develop and implement coping strategies to decrease the intensity and frequency of symptoms related to anxiety and depression Target Date: 12/04/23  Progress: progressing   Develop and utilize effective communication strategies to implement and maintain healthy boundaries with others Target Date: 12/04/23  Progress: progressing   Doree Barthel, LCSW

## 2023-06-08 ENCOUNTER — Encounter: Payer: Self-pay | Admitting: Nurse Practitioner

## 2023-06-08 ENCOUNTER — Ambulatory Visit (INDEPENDENT_AMBULATORY_CARE_PROVIDER_SITE_OTHER): Payer: Medicare HMO | Admitting: Nurse Practitioner

## 2023-06-08 VITALS — BP 120/82 | HR 98 | Temp 98.6°F | Ht 65.0 in | Wt 240.2 lb

## 2023-06-08 DIAGNOSIS — E6609 Other obesity due to excess calories: Secondary | ICD-10-CM | POA: Diagnosis not present

## 2023-06-08 DIAGNOSIS — I10 Essential (primary) hypertension: Secondary | ICD-10-CM

## 2023-06-08 DIAGNOSIS — Z0000 Encounter for general adult medical examination without abnormal findings: Secondary | ICD-10-CM

## 2023-06-08 DIAGNOSIS — Z0001 Encounter for general adult medical examination with abnormal findings: Secondary | ICD-10-CM

## 2023-06-08 DIAGNOSIS — L304 Erythema intertrigo: Secondary | ICD-10-CM | POA: Diagnosis not present

## 2023-06-08 DIAGNOSIS — E782 Mixed hyperlipidemia: Secondary | ICD-10-CM | POA: Diagnosis not present

## 2023-06-08 DIAGNOSIS — Z6839 Body mass index (BMI) 39.0-39.9, adult: Secondary | ICD-10-CM

## 2023-06-08 DIAGNOSIS — N898 Other specified noninflammatory disorders of vagina: Secondary | ICD-10-CM | POA: Diagnosis not present

## 2023-06-08 DIAGNOSIS — E039 Hypothyroidism, unspecified: Secondary | ICD-10-CM

## 2023-06-08 DIAGNOSIS — Z1231 Encounter for screening mammogram for malignant neoplasm of breast: Secondary | ICD-10-CM

## 2023-06-08 DIAGNOSIS — N491 Inflammatory disorders of spermatic cord, tunica vaginalis and vas deferens: Secondary | ICD-10-CM | POA: Insufficient documentation

## 2023-06-08 DIAGNOSIS — M79672 Pain in left foot: Secondary | ICD-10-CM | POA: Diagnosis not present

## 2023-06-08 DIAGNOSIS — M79671 Pain in right foot: Secondary | ICD-10-CM

## 2023-06-08 DIAGNOSIS — R7303 Prediabetes: Secondary | ICD-10-CM

## 2023-06-08 DIAGNOSIS — Z79899 Other long term (current) drug therapy: Secondary | ICD-10-CM | POA: Diagnosis not present

## 2023-06-08 DIAGNOSIS — H6122 Impacted cerumen, left ear: Secondary | ICD-10-CM

## 2023-06-08 HISTORY — DX: Inflammatory disorders of spermatic cord, tunica vaginalis and vas deferens: N49.1

## 2023-06-08 LAB — POCT URINALYSIS DIP (CLINITEK)
Bilirubin, UA: NEGATIVE
Blood, UA: NEGATIVE
Glucose, UA: NEGATIVE mg/dL
Ketones, POC UA: NEGATIVE mg/dL
Leukocytes, UA: NEGATIVE
Nitrite, UA: NEGATIVE
POC PROTEIN,UA: NEGATIVE
Spec Grav, UA: 1.02 (ref 1.010–1.025)
Urobilinogen, UA: 0.2 E.U./dL
pH, UA: 6 (ref 5.0–8.0)

## 2023-06-08 MED ORDER — DESOXIMETASONE 0.25 % EX CREA: TOPICAL_CREAM | CUTANEOUS | 4 refills | Status: DC

## 2023-06-08 MED ORDER — CEPHALEXIN 500 MG PO CAPS
500.0000 mg | ORAL_CAPSULE | Freq: Four times a day (QID) | ORAL | 0 refills | Status: AC
Start: 2023-06-08 — End: 2023-06-18

## 2023-06-08 NOTE — Progress Notes (Addendum)
I,[name],acting as a Neurosurgeon for Nancy Felts, FNP.,have documented all relevant documentation on the behalf of Nancy Felts, FNP,as directed by  Nancy Felts, FNP while in the presence of Nancy Felts, FNP.  Subjective:    Patient ID: Nancy [name] , female    DOB: 05/05/1951 , 72 y.o.   MRN: [id number]  Chief Complaint  Patient presents with   Annual Exam    HPI  Patient presents today for HM. Patient states compliance with medications and has no other concerns today. Patient denies any headaches, SOB, or chest pains.   Patient reports she has a bump on the left lip of her vagina. Patient reports she would like to get her EKG at her cardiologist.       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   Complication of anesthesia    pt states she woke up during a tonsillectomy when she was 28   Hyperlipidemia    Hypertension    Hypothyroidism 1980   Pre-diabetes    Prediabetes    Thyroid disease      Family History  Problem Relation Age of Onset   Diabetes Mother    Hypertension Mother    Hypertension Father    Heart disease Father    Diabetes Sister    Kidney failure Sister    Diabetes Brother    Kidney disease Brother    Diabetes Sister    Diabetes Sister    Asthma Sister    Cancer Maternal Grandfather    Cancer Paternal Grandfather      Current Outpatient Medications:    azelastine (OPTIVAR) 0.05 % ophthalmic solution, INSTILL ONE DROP IN EACH EYE TWICE DAILY Strength: 0.05 %, Disp: 6 mL, Rfl: 3   blood glucose meter kit and supplies, Dispense based on patient and insurance preference. Use up to four times daily as directed. (FOR ICD-10 E10.9, E11.9)., Disp: 1 each, Rfl: 3   Blood Glucose Monitoring Suppl (ACCU-CHEK GUIDE) w/Device KIT, 1 Device by Does not apply route daily., Disp: 1 kit, Rfl: 0   Blood Pressure Monitor KIT, USE TO CHECK BLOOD PRESSURE 110, Disp: 1 kit, Rfl: 2    Cholecalciferol 125 MCG (5000 UT) TABS, Take 5,000 Units by mouth daily., Disp: , Rfl:    Flaxseed, Linseed, (FLAXSEED OIL) 1000 MG CAPS, Take by mouth., Disp: , Rfl:    GLUCOSAMINE CHONDROITIN COMPLX PO, Take 1 capsule by mouth daily. 1500 mg/1200 mg, Disp: , Rfl:    glucose blood (ONETOUCH ULTRA) test strip, USE AS INSTRUCTED, Disp: 100 strip, Rfl: 12   Lactobacillus (PROBIOTIC ACIDOPHILUS PO), Take 0.5 mg by mouth daily., Disp: , Rfl:    Lancets (ONETOUCH ULTRASOFT) lancets, USE TO check blood sugar DAILY, Disp: 100 each, Rfl: 5   Magnesium 250 MG TABS, Take 250 mg by mouth daily., Disp: , Rfl:    Multiple Vitamins-Minerals (MULTIVITAMIN WOMEN 50+) TABS, Take 1 tablet by mouth daily., Disp: , Rfl:    atorvastatin (LIPITOR) 20 MG tablet, TAKE ONE TABLET BY MOUTH EVERYDAY AT BEDTIME FOR CHOLESTEROL, Disp: 90 tablet, Rfl: 1   desoximetasone (TOPICORT) 0.25 % cream, Apply to skin daily safe to use on face and skin folds, Disp: 100 g, Rfl: 4   levothyroxine (SYNTHROID) 150 MCG tablet, TAKE ONE TABLET BY MOUTH BEFORE BREAKFAST, Disp: 90 tablet, Rfl: 1   spironolactone (ALDACTONE) 50 MG tablet, TAKE ONE TABLET BY MOUTH ONCE  DAILY, Disp: 90 tablet, Rfl: 1   No Known Allergies    The patient states she is post menopausal status for birth control. Negative for: breast discharge, breast lump(s), breast pain and breast self exam. Associated symptoms include abnormal vaginal bleeding. Pertinent negatives include abnormal bleeding (hematology), anxiety, decreased libido, depression, difficulty falling sleep, dyspareunia, history of infertility, nocturia, sexual dysfunction, sleep disturbances, urinary incontinence, urinary urgency, vaginal discharge and vaginal itching. Diet regular; admits to lately not as good as it should be. Does not eat a lot of sweets but does eat some carbs. The patient states her exercise level is moderate - walking on treadmill daily - 3 miles.   The patient's tobacco use is:   Social History   Tobacco Use  Smoking Status Former   Current packs/day: 0.00   Average packs/day: 1 pack/day for 1 year (1.0 ttl pk-yrs)   Types: Cigarettes   Start date: 12/22/2007   Quit date: 12/21/2008   Years since quitting: 14.5  Smokeless Tobacco Never   She has been exposed to passive smoke. The patient's alcohol use is:  Social History   Substance and Sexual Activity  Alcohol Use Not Currently   Comment: rare    Review of Systems  Constitutional: Negative.   HENT: Negative.    Eyes: Negative.   Respiratory: Negative.    Cardiovascular: Negative.   Gastrointestinal: Negative.   Endocrine: Negative.   Genitourinary: Negative.        "Bump to vaginal area on lip", intermittent, drainage once or twice.   Musculoskeletal: Negative.   Skin: Negative.   Allergic/Immunologic: Negative.   Neurological: Negative.   Hematological: Negative.   Psychiatric/Behavioral: Negative.       Today's Vitals   06/08/23 0941  BP: 120/82  Pulse: 98  Temp: 98.6 F (37 C)  TempSrc: Oral  Weight: 240 lb 3.2 oz (109 kg)  Height: 5\' 5"  (1.651 m)  PainSc: 0-No pain   Body mass index is 39.97 kg/m.  Wt Readings from Last 3 Encounters:  06/08/23 240 lb 3.2 oz (109 kg)  02/18/23 240 lb (108.9 kg)  09/24/22 223 lb 3.2 oz (101.2 kg)     Objective:  Physical Exam Vitals reviewed.  Constitutional:      General: She is not in acute distress.    Appearance: Normal appearance. She is well-developed. She is obese.  HENT:     Head: Normocephalic and atraumatic.     Right Ear: Hearing, tympanic membrane, ear canal and external ear normal. There is no impacted cerumen.     Left Ear: Hearing, tympanic membrane, ear canal and external ear normal. There is no impacted cerumen.     Nose: Nose normal.     Mouth/Throat:     Mouth: Mucous membranes are moist.  Eyes:     General: Lids are normal.     Extraocular Movements: Extraocular movements intact.     Conjunctiva/sclera: Conjunctivae  normal.     Pupils: Pupils are equal, round, and reactive to light.     Funduscopic exam:    Right eye: No papilledema.        Left eye: No papilledema.  Neck:     Thyroid: No thyroid mass.     Vascular: No carotid bruit.  Cardiovascular:     Rate and Rhythm: Normal rate and regular rhythm.     Pulses: Normal pulses.     Heart sounds: Normal heart sounds. No murmur heard. Pulmonary:     Effort: Pulmonary effort is  normal. No respiratory distress.     Breath sounds: Normal breath sounds. No wheezing.  Chest:     Chest wall: No mass.  Breasts:    Tanner Score is 5.     Right: Normal. No mass or tenderness.     Left: Normal. No mass or tenderness.  Abdominal:     General: Abdomen is flat. Bowel sounds are normal. There is no distension.     Palpations: Abdomen is soft.     Tenderness: There is no abdominal tenderness.  Genitourinary:    Labia:        Right: No lesion.      Rectum: Guaiac result negative.     Comments: Left side labia majora has raised vesicle.  Musculoskeletal:        General: No swelling. Normal range of motion.     Cervical back: Full passive range of motion without pain, normal range of motion and neck supple.     Right lower leg: No edema.     Left lower leg: No edema.  Lymphadenopathy:     Upper Body:     Right upper body: No supraclavicular, axillary or pectoral adenopathy.     Left upper body: No supraclavicular, axillary or pectoral adenopathy.  Skin:    General: Skin is warm and dry.     Capillary Refill: Capillary refill takes less than 2 seconds.     Comments: Healing surgical scars to panus  Neurological:     General: No focal deficit present.     Mental Status: She is alert and oriented to person, place, and time.     Cranial Nerves: No cranial nerve deficit.     Sensory: No sensory deficit.     Motor: No weakness.  Psychiatric:        Mood and Affect: Mood normal.        Behavior: Behavior normal.        Thought Content: Thought content  normal.        Judgment: Judgment normal.         Assessment And Plan:     Encounter for annual health examination Assessment & Plan: Behavior modifications discussed and diet history reviewed.   Pt will continue to exercise regularly and modify diet with low GI, plant based foods and decrease intake of processed foods.  Recommend intake of daily multivitamin, Vitamin D, and calcium.  Recommend mammogram and colonoscopy for preventive screenings, as well as recommend immunizations that include influenza, TDAP, and Shingles    Class 2 obesity due to excess calories without serious comorbidity with body mass index (BMI) of 39.0 to 39.9 in adult Assessment & Plan: She is encouraged to strive for BMI less than 30 to decrease cardiac risk. Advised to aim for at least 150 minutes of exercise per week.    Essential hypertension Assessment & Plan: Blood pressure is controlled, declines EKG would like to have done at the Cardiologist, she is to call for her f/u appt.  Orders: -     POCT URINALYSIS DIP (CLINITEK) -     Microalbumin / creatinine urine ratio -     CMP14+EGFR  Prediabetes Assessment & Plan: Hgba1c is stable, diet controlled.   Orders: -     CMP14+EGFR -     Hemoglobin A1c  Mixed hyperlipidemia Assessment & Plan: Cholesterol levels are stable, continue statin  Orders: -     CMP14+EGFR -     Lipid panel  Intertrigo Assessment & Plan: Refill for  steroid cream, referral also placed for continued evaluation  Orders: -     POCT URINALYSIS DIP (CLINITEK) -     Ambulatory referral to Dermatology -     Desoximetasone; Apply to skin daily safe to use on face and skin folds  Dispense: 100 g; Refill: 4  Caruncle of vagina Assessment & Plan: Advised to apply warm compresses to area and to take cephalexin  Orders: -     Cephalexin; Take 1 capsule (500 mg total) by mouth 4 (four) times daily for 10 days.  Dispense: 40 capsule; Refill: 0  Pain in both  feet Assessment & Plan: She has been having persistent pain and would like referral to podiatry  Orders: -     Ambulatory referral to Podiatry  Excessive cerumen in left ear canal Assessment & Plan: Cleaned with lighted curette with good results   Encounter for screening mammogram for breast cancer  Other long term (current) drug therapy -     CBC with Differential/Platelet  Acquired hypothyroidism Assessment & Plan: Continue current medications and f/u with Endocrinology, thyroid levels are managed by Endo    Return for 1 year physical, 6 month bp check.  Patient was given opportunity to ask questions. Patient verbalized understanding of the plan and was able to repeat key elements of the plan. All questions were answered to their satisfaction.   Nancy Felts, FNP  I, Nancy Felts, FNP, have reviewed all documentation for this visit. The documentation on 06/08/23 for the exam, diagnosis, procedures, and orders are all accurate and complete.

## 2023-06-08 NOTE — Assessment & Plan Note (Signed)
Blood pressure is controlled, declines EKG would like to have done at the Cardiologist, she is to call for her f/u appt.

## 2023-06-08 NOTE — Patient Instructions (Signed)
Make sure to soak in tub or use a warm compress for the vaginal boil.

## 2023-06-09 LAB — LIPID PANEL
Chol/HDL Ratio: 2.3 ratio (ref 0.0–4.4)
Cholesterol, Total: 142 mg/dL (ref 100–199)
HDL: 61 mg/dL (ref >39)
LDL Chol Calc (NIH): 65 mg/dL (ref 0–99)
Triglycerides: 81 mg/dL (ref 0–149)
VLDL Cholesterol Cal: 16 mg/dL (ref 5–40)

## 2023-06-09 LAB — CBC WITH DIFFERENTIAL/PLATELET
Basophils Absolute: 0.1 10*3/uL (ref 0.0–0.2)
Basos: 1 %
EOS (ABSOLUTE): 0.2 10*3/uL (ref 0.0–0.4)
Eos: 4 %
Hematocrit: 43.9 % (ref 34.0–46.6)
Hemoglobin: 14.4 g/dL (ref 11.1–15.9)
Immature Grans (Abs): 0 10*3/uL (ref 0.0–0.1)
Immature Granulocytes: 0 %
Lymphocytes Absolute: 1.6 10*3/uL (ref 0.7–3.1)
Lymphs: 25 %
MCH: 29.7 pg (ref 26.6–33.0)
MCHC: 32.8 g/dL (ref 31.5–35.7)
MCV: 91 fL (ref 79–97)
Monocytes Absolute: 0.5 10*3/uL (ref 0.1–0.9)
Monocytes: 8 %
Neutrophils Absolute: 3.9 10*3/uL (ref 1.4–7.0)
Neutrophils: 62 %
Platelets: 222 10*3/uL (ref 150–450)
RBC: 4.85 x10E6/uL (ref 3.77–5.28)
RDW: 13.6 % (ref 11.7–15.4)
WBC: 6.3 10*3/uL (ref 3.4–10.8)

## 2023-06-09 LAB — CMP14+EGFR
ALT: 27 IU/L (ref 0–32)
AST: 32 IU/L (ref 0–40)
Albumin: 4.4 g/dL (ref 3.8–4.8)
Alkaline Phosphatase: 72 IU/L (ref 44–121)
BUN/Creatinine Ratio: 14 (ref 12–28)
BUN: 16 mg/dL (ref 8–27)
Bilirubin Total: 0.6 mg/dL (ref 0.0–1.2)
CO2: 24 mmol/L (ref 20–29)
Calcium: 9.9 mg/dL (ref 8.7–10.3)
Chloride: 105 mmol/L (ref 96–106)
Creatinine, Ser: 1.13 mg/dL — ABNORMAL HIGH (ref 0.57–1.00)
Globulin, Total: 2.8 g/dL (ref 1.5–4.5)
Glucose: 84 mg/dL (ref 70–99)
Potassium: 4.9 mmol/L (ref 3.5–5.2)
Sodium: 144 mmol/L (ref 134–144)
Total Protein: 7.2 g/dL (ref 6.0–8.5)
eGFR: 52 mL/min/{1.73_m2} — ABNORMAL LOW (ref >59)

## 2023-06-09 LAB — MICROALBUMIN / CREATININE URINE RATIO
Creatinine, Urine: 32.1 mg/dL
Microalb/Creat Ratio: 21 mg/g creat (ref 0–29)
Microalbumin, Urine: 6.6 ug/mL

## 2023-06-09 LAB — HEMOGLOBIN A1C
Est. average glucose Bld gHb Est-mCnc: 120 mg/dL
Hgb A1c MFr Bld: 5.8 % — ABNORMAL HIGH (ref 4.8–5.6)

## 2023-06-11 ENCOUNTER — Ambulatory Visit
Admission: RE | Admit: 2023-06-11 | Discharge: 2023-06-11 | Disposition: A | Discharge status: Home / Self Care | Payer: Medicare HMO | Source: Ambulatory Visit | Attending: Nurse Practitioner | Admitting: Nurse Practitioner

## 2023-06-11 ENCOUNTER — Other Ambulatory Visit: Payer: Self-pay | Admitting: Nurse Practitioner

## 2023-06-11 DIAGNOSIS — Z1231 Encounter for screening mammogram for malignant neoplasm of breast: Secondary | ICD-10-CM

## 2023-06-11 DIAGNOSIS — Z0000 Encounter for general adult medical examination without abnormal findings: Secondary | ICD-10-CM

## 2023-06-11 DIAGNOSIS — E6609 Other obesity due to excess calories: Secondary | ICD-10-CM

## 2023-06-11 DIAGNOSIS — M79671 Pain in right foot: Secondary | ICD-10-CM

## 2023-06-11 DIAGNOSIS — I10 Essential (primary) hypertension: Secondary | ICD-10-CM

## 2023-06-11 DIAGNOSIS — E782 Mixed hyperlipidemia: Secondary | ICD-10-CM

## 2023-06-11 DIAGNOSIS — L304 Erythema intertrigo: Secondary | ICD-10-CM

## 2023-06-11 DIAGNOSIS — Z79899 Other long term (current) drug therapy: Secondary | ICD-10-CM

## 2023-06-11 DIAGNOSIS — H6122 Impacted cerumen, left ear: Secondary | ICD-10-CM

## 2023-06-11 DIAGNOSIS — R7303 Prediabetes: Secondary | ICD-10-CM

## 2023-06-11 DIAGNOSIS — N491 Inflammatory disorders of spermatic cord, tunica vaginalis and vas deferens: Secondary | ICD-10-CM

## 2023-06-17 ENCOUNTER — Other Ambulatory Visit: Payer: Self-pay | Admitting: Nurse Practitioner

## 2023-06-17 DIAGNOSIS — I10 Essential (primary) hypertension: Secondary | ICD-10-CM

## 2023-06-17 DIAGNOSIS — E039 Hypothyroidism, unspecified: Secondary | ICD-10-CM

## 2023-06-18 DIAGNOSIS — E039 Hypothyroidism, unspecified: Secondary | ICD-10-CM | POA: Insufficient documentation

## 2023-06-18 DIAGNOSIS — H6122 Impacted cerumen, left ear: Secondary | ICD-10-CM | POA: Insufficient documentation

## 2023-06-18 NOTE — Assessment & Plan Note (Signed)
Behavior modifications discussed and diet history reviewed.   Pt will continue to exercise regularly and modify diet with low GI, plant based foods and decrease intake of processed foods.  Recommend intake of daily multivitamin, Vitamin D, and calcium.  Recommend mammogram and colonoscopy for preventive screenings, as well as recommend immunizations that include influenza, TDAP, and Shingles  

## 2023-06-18 NOTE — Assessment & Plan Note (Signed)
Cleaned with lighted curette with good results

## 2023-06-18 NOTE — Assessment & Plan Note (Signed)
She has been having persistent pain and would like referral to podiatry

## 2023-06-18 NOTE — Assessment & Plan Note (Signed)
Hgba1c is stable, diet controlled.

## 2023-06-18 NOTE — Assessment & Plan Note (Signed)
She is encouraged to strive for BMI less than 30 to decrease cardiac risk. Advised to aim for at least 150 minutes of exercise per week.  

## 2023-06-18 NOTE — Assessment & Plan Note (Signed)
Continue current medications and f/u with Endocrinology, thyroid levels are managed by Endo

## 2023-06-18 NOTE — Assessment & Plan Note (Signed)
Cholesterol levels are stable, continue statin

## 2023-06-18 NOTE — Assessment & Plan Note (Signed)
Advised to apply warm compresses to area and to take cephalexin

## 2023-06-18 NOTE — Assessment & Plan Note (Addendum)
Refill for steroid cream, referral also placed for continued evaluation

## 2023-07-01 ENCOUNTER — Ambulatory Visit: Payer: Medicare HMO | Admitting: Family Medicine

## 2023-07-01 ENCOUNTER — Other Ambulatory Visit: Payer: Self-pay | Admitting: Nurse Practitioner

## 2023-07-01 DIAGNOSIS — N491 Inflammatory disorders of spermatic cord, tunica vaginalis and vas deferens: Secondary | ICD-10-CM

## 2023-07-02 ENCOUNTER — Ambulatory Visit: Payer: Medicare HMO | Admitting: Clinical

## 2023-07-02 DIAGNOSIS — F4321 Adjustment disorder with depressed mood: Secondary | ICD-10-CM

## 2023-07-02 NOTE — Progress Notes (Signed)
[facility] Counselor/Therapist Progress Note  Patient ID: Nancy [name], MRN: [id number],    Date: 07/02/2023  Time Spent: 2:34pm - 3:15pm : 41 minutes   Treatment Type: Individual Therapy  Reported Symptoms: Patient reported recent depressed mood and fatigue.   Mental Status Exam: Appearance:  Neat and Well Groomed     Behavior: Appropriate  Motor: Normal  Speech/Language:  Clear and Coherent  Affect: Appropriate  Mood: normal  Thought process: normal  Thought content:   WNL  Sensory/Perceptual disturbances:   WNL  Orientation: oriented to person, place, and situation  Attention: Good  Concentration: Good  Memory: WNL  Fund of knowledge:  Good  Insight:   Good  Judgment:  Good  Impulse Control: Fair   Risk Assessment: Danger to Self:  No Patient denied current suicidal ideation  Self-injurious Behavior: No Danger to Others: No Patient denied current homicidal ideation Duty to Warn:no Physical Aggression / Violence:No  Access to Firearms a concern: No  Gang Involvement:No   Subjective: Patient stated,  "I've been feeling more depressed" in regards to patient's mood since last session. Patient reported feeling fatigue, feeling"achy", and reported she has not been as physically active recently. Patient stated, "I'm feeling ok today". Patient reported when patient experienced sadness as a child her mother would involve patient in an activity and patient stated, "that sorrow would go away".  Patient reported she continues to experience a fear of being out in public and contracting COVID. Patient stated, "I'm still afraid to go to the movies or to church" due to a fear of developing COVID. Patient stated, "people tell me my fear is not rational and you have to go on and live life". Patient reported two of her sisters passed away from COVID. 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 Patient reported she feels more comfortable if she can continue to move while in public settings and wearing a mask at times in public. Patient stated, "I feel like I'm in a catch 22" and stated, "I know that my fear is irrational". Patient stated, "I've always kind of over thought things and looking at things from every angle".   Interventions: Cognitive Behavioral Therapy. Clinician conducted session via caregility video from clinician's home office. Patient provided verbal consent to proceed with telehealth session and is aware of limitations of telephone or video visits. Patient participated in session from patient's home. Assessed patient's mood since last session and patient's current mood. Provided psycho education related to symptoms of depression and the impact of physical activity on mood. Provided psycho education related to the cycle of anxiety, CBT. Explored and identified triggers for feelings of fear and thoughts associated with fear. Provided psycho education related to cognitive distortions/negative thoughts and cognitive restructuring. Challenged thought distortions expressed during session. Explored what would allow patient to feel more comfortable being out in public settings. Clinician requested patient complete a thought record for homework.     Collaboration of Care: Other not required at this time  Diagnosis:  Adjustment disorder with depressed mood     Plan: Patient is to utilize [employer] Therapy, thought re-framing, behavioral activation, and coping strategies to decrease symptoms associated with their diagnosis. Frequency: bi-weekly  Modality: individual      Long-term goal:   Patient identified a long term goal to enroll in college courses/program for creative writing.  Target Date: 06/03/24  Progress: 0  Short-term goal:  Develop and implement coping strategies to decrease food intake when feeling frustrated, in response to  interpersonal conflict, or in response to feelings of isolation   Target Date: 12/04/23  Progress: progressing    Identify and increase patient's participation in activities patient enjoys, such as, pursuing culinary interests, travel, knitting, crocheting, learning to draw Target Date: 12/04/23  Progress: progressing    Develop and implement coping strategies to decrease the intensity and frequency of symptoms related to anxiety and depression Target Date: 12/04/23  Progress: progressing    Develop and utilize effective communication strategies to implement and maintain healthy boundaries with others Target Date: 12/04/23  Progress: progressing    Doree Barthel, LCSW

## 2023-07-02 NOTE — Progress Notes (Signed)
                Sherod Cisse, LCSW 

## 2023-07-09 IMAGING — US US PELVIS LIMITED
1 series · 14 of 14 positions shown · non-contrast
Comparison: None Available.

CLINICAL DATA: Status post panniculectomy.  Ongoing swelling.

EXAM:
LIMITED ULTRASOUND OF PELVIS
TECHNIQUE: Limited transabdominal ultrasound examination of the pelvis was
performed.

[Series 1: us pelvis limited (transabdominal only) · 14 acquisitions, 14 frames shown]
[im 1/14]
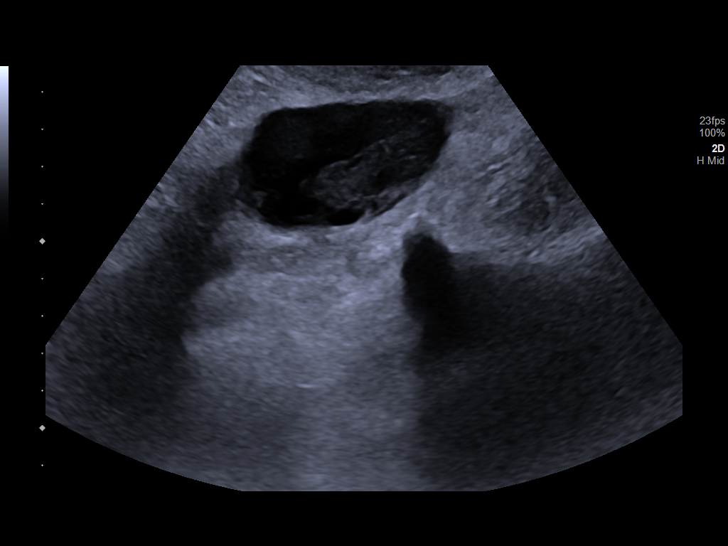
[im 2/14]
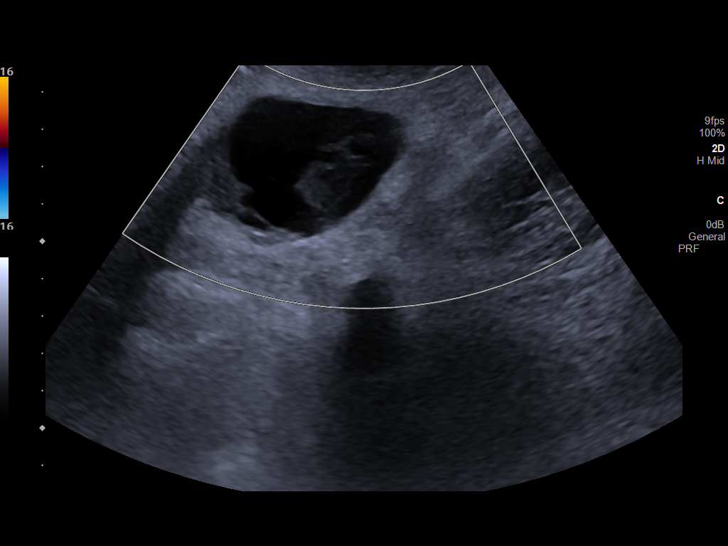
[im 3/14]
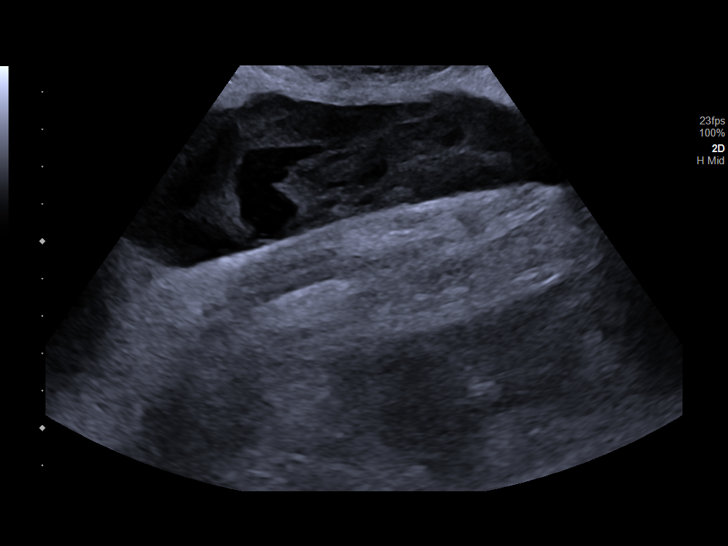
[im 4/14]
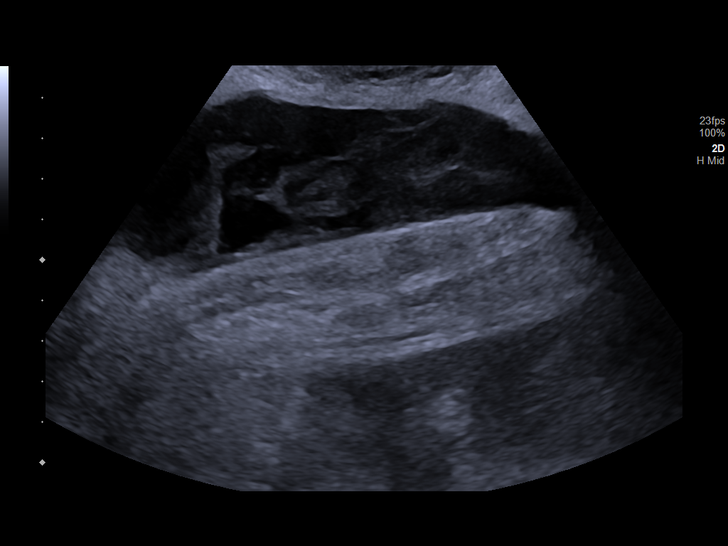
[im 5/14]
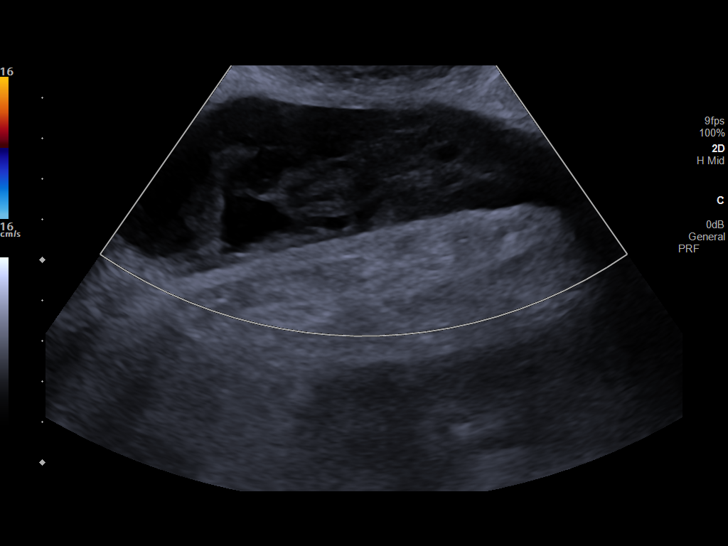
[im 6/14]
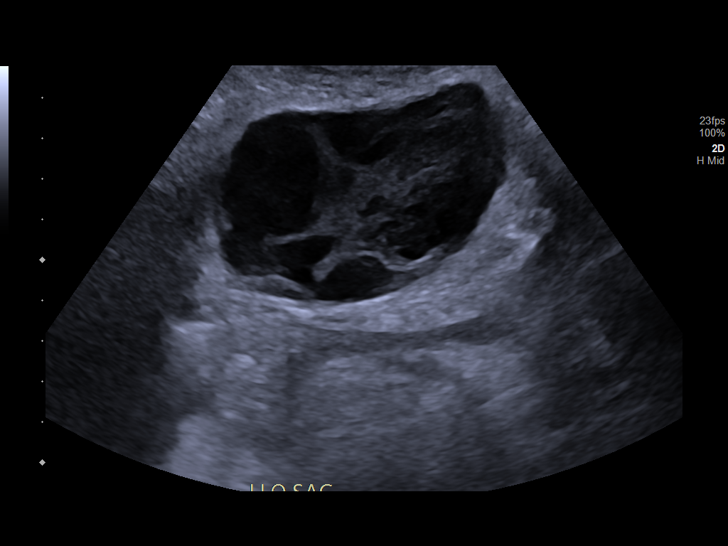
[im 7/14]
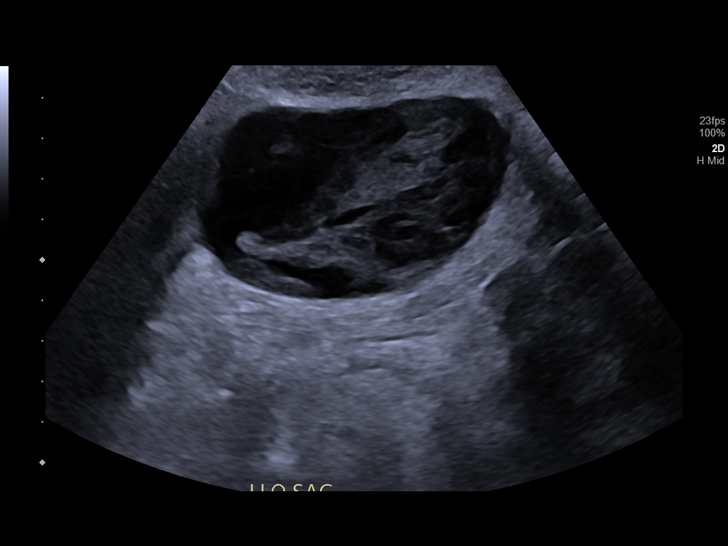
[im 8/14]
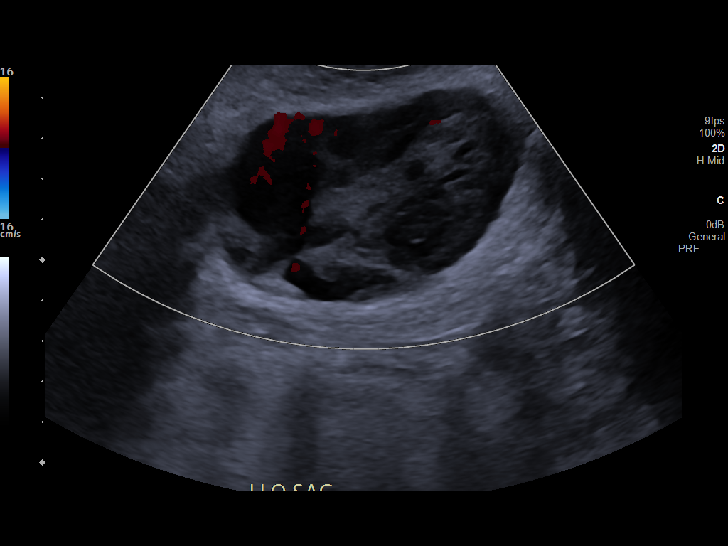
[im 9/14]
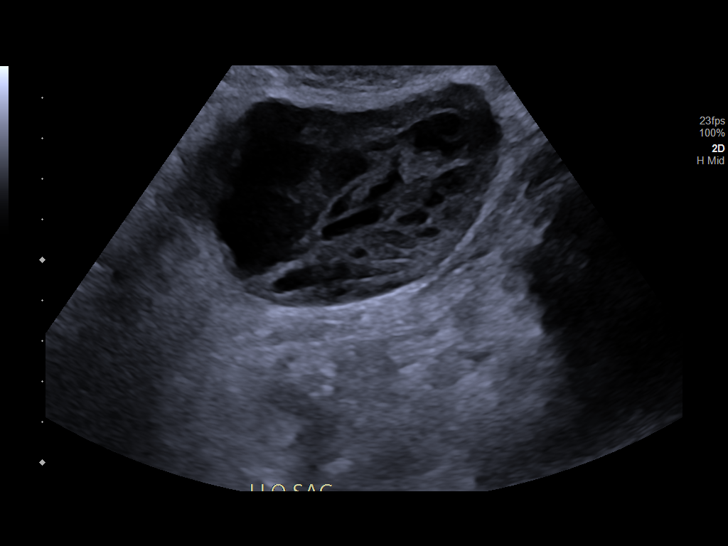
[im 10/14]
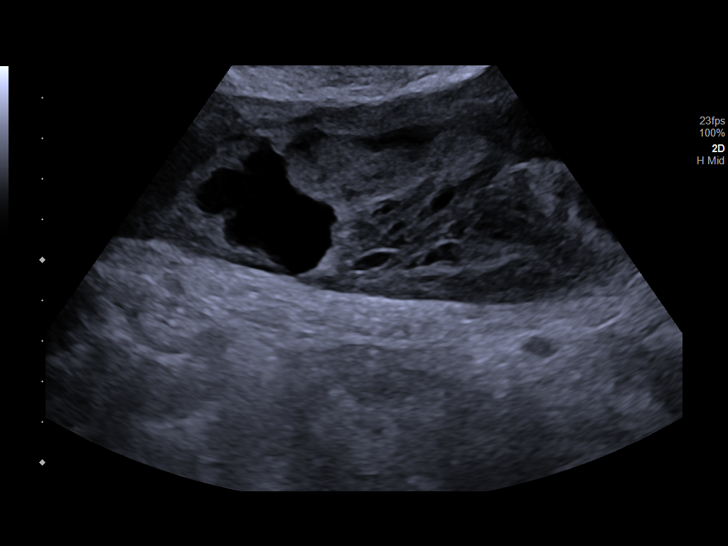
[im 11/14]
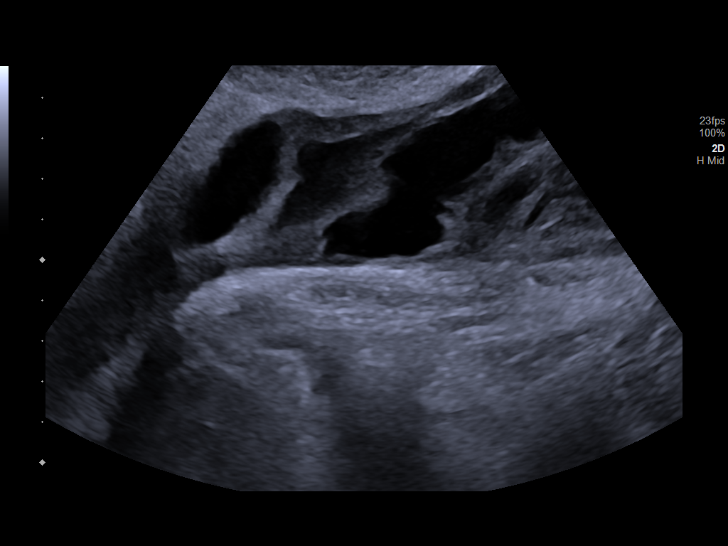
[im 12/14]
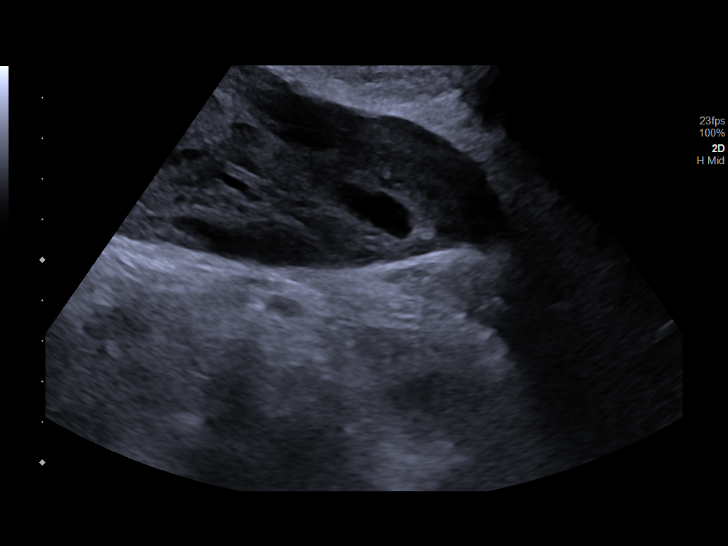
[im 13/14]
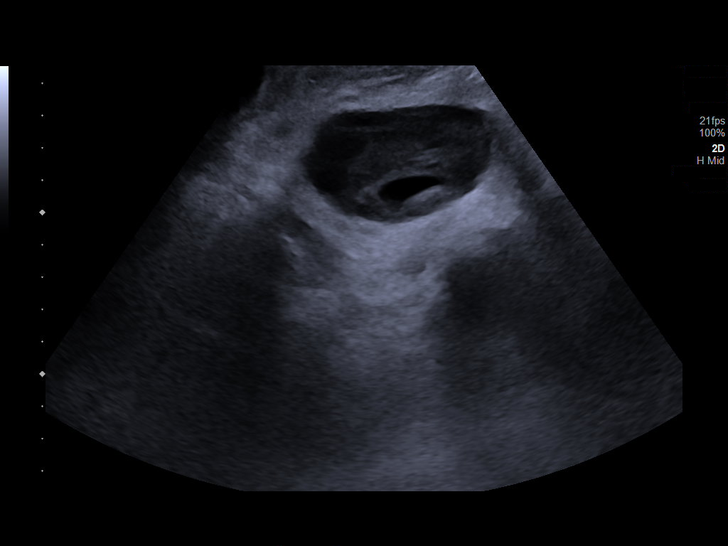
[im 14/14]
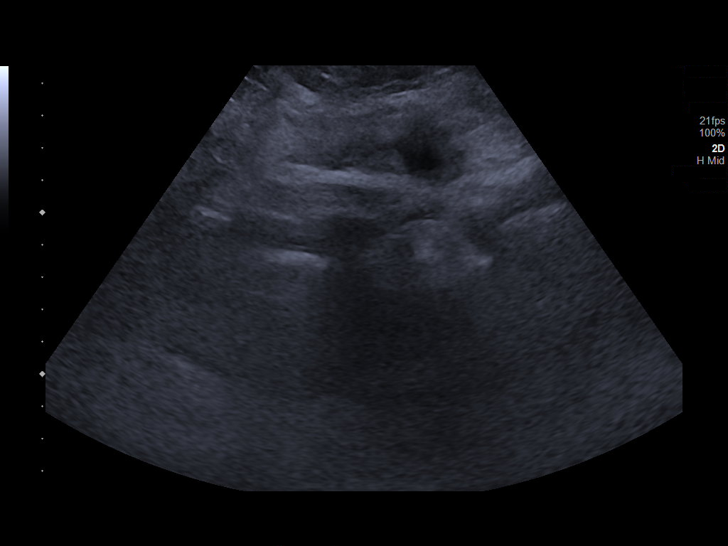

[14 of 14 positions shown; findings below may reference images not displayed]

FINDINGS: Complex collections are identified in the right lower quadrant and
the left lower quadrant. There appear to be cystic and solid
components in both locations.
IMPRESSION: Complex collections are identified in the right lower and left lower
quadrants with apparent solid and cystic components. Given the
history of surgery, these could represent fat necrosis or evolving
hematoma. Recommend follow-up ultrasound in approximately 4 weeks to
ensure improvement and/or resolution.

## 2023-07-14 ENCOUNTER — Ambulatory Visit: Payer: Medicare HMO | Admitting: Podiatry

## 2023-07-14 ENCOUNTER — Ambulatory Visit (INDEPENDENT_AMBULATORY_CARE_PROVIDER_SITE_OTHER): Payer: Medicare HMO

## 2023-07-14 VITALS — BP 139/65

## 2023-07-14 DIAGNOSIS — M79671 Pain in right foot: Secondary | ICD-10-CM | POA: Diagnosis not present

## 2023-07-14 DIAGNOSIS — M79672 Pain in left foot: Secondary | ICD-10-CM

## 2023-07-14 DIAGNOSIS — M722 Plantar fascial fibromatosis: Secondary | ICD-10-CM

## 2023-07-14 NOTE — Progress Notes (Signed)
Subjective:  Patient ID: Nancy [name], female    DOB: [birth year],  MRN: [id number]  No chief complaint on file.   72 y.o. female presents with the above complaint.  Patient presents with bilateral heel pain that has been on for quite some time is progressive and worse worse with ambulation is with pressure.  She has not seen MRIs prior to seeing me denies any other acute complaints and would like to discuss treatment options for it.  She states that she tried some over-the-counter option which has not helped.  Pain scale 7 out of 10   Review of Systems: Negative except as noted in the HPI. Denies N/V/F/Ch.  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 Use up to four times daily as directed. (FOR ICD-10 E10.9, E11.9)., Disp: 1 each, Rfl: 3   Blood Glucose Monitoring Suppl (ACCU-CHEK GUIDE) w/Device KIT, 1 Device by Does not apply route daily., Disp: 1 kit, Rfl: 0   Blood Pressure Monitor KIT, USE TO CHECK BLOOD PRESSURE 110, Disp: 1 kit, Rfl: 2   Cholecalciferol 125 MCG (5000 UT) TABS, Take 5,000 Units by mouth daily., Disp: , Rfl:    desoximetasone (TOPICORT) 0.25 % cream, Apply to skin daily safe to use on face and skin folds, Disp: 100 g, Rfl: 4   Flaxseed, Linseed, (FLAXSEED OIL) 1000 MG CAPS, Take by mouth., Disp: , Rfl:    GLUCOSAMINE  CHONDROITIN COMPLX PO, Take 1 capsule by mouth daily. 1500 mg/1200 mg, Disp: , Rfl:    glucose blood (ONETOUCH ULTRA) test strip, USE AS INSTRUCTED, Disp: 100 strip, Rfl: 12   Lactobacillus (PROBIOTIC ACIDOPHILUS PO), Take 0.5 mg by mouth daily., Disp: , Rfl:    Lancets (ONETOUCH ULTRASOFT) lancets, USE TO check blood sugar DAILY, Disp: 100 each, Rfl: 5   levothyroxine (SYNTHROID) 150 MCG tablet, TAKE ONE TABLET BY MOUTH BEFORE BREAKFAST, Disp: 90 tablet, Rfl: 1   Magnesium 250 MG TABS, Take 250 mg by mouth daily., Disp: , Rfl:    Multiple Vitamins-Minerals (MULTIVITAMIN WOMEN 50+) TABS, Take 1 tablet by mouth daily., Disp: , Rfl:    spironolactone (ALDACTONE) 50 MG tablet, TAKE ONE TABLET BY MOUTH ONCE DAILY, Disp: 90 tablet, Rfl: 1  Social History   Tobacco Use  Smoking Status Former   Current packs/day: 0.00   Average packs/day: 1 pack/day for 1 year (1.0 ttl pk-yrs)   Types: Cigarettes   Start date: 12/22/2007   Quit date: 12/21/2008   Years since quitting: 14.5  Smokeless Tobacco Never    No Known Allergies Objective:   Vitals:   07/14/23 1101  BP: 139/65   There is no height or weight on file to calculate BMI. Constitutional Well developed. Well nourished.  Vascular Dorsalis pedis pulses  palpable bilaterally. Posterior tibial pulses palpable bilaterally. Capillary refill normal to all digits.  No cyanosis or clubbing noted. Pedal hair growth normal.  Neurologic Normal speech. Oriented to person, place, and time. Epicritic sensation to light touch grossly present bilaterally.  Dermatologic Nails well groomed and normal in appearance. No open wounds. No skin lesions.  Orthopedic: Normal joint ROM without pain or crepitus bilaterally. No visible deformities. Tender to palpation at the calcaneal tuber bilaterally. No pain with calcaneal squeeze bilaterally. Ankle ROM diminished range of motion bilaterally. Silfverskiold Test: positive bilaterally.   Radiographs: 3  views of sketcher mature the bilateral foot:Plantar and posterior heel spurring noted midfoot arthritis noted pes planovalgus foot structure noted  Assessment:   1. Plantar fasciitis of right foot   2. Plantar fasciitis, left    Plan:  Patient was evaluated and treated and all questions answered.  Plantar Fasciitis, bilaterally - XR reviewed as above.  - Educated on icing and stretching. Instructions given.  - Injection delivered to the plantar fascia as below. - DME: Plantar fascial brace dispensed to support the medial longitudinal arch of the foot and offload pressure from the heel and prevent arch collapse during weightbearing - Pharmacologic management: None  Procedure: Injection Tendon/Ligament Location: Bilateral plantar fascia at the glabrous junction; medial approach. Skin Prep: alcohol Injectate: 0.5 cc 0.5% marcaine plain, 0.5 cc of 1% Lidocaine, 0.5 cc kenalog 10. Disposition: Patient tolerated procedure well. Injection site dressed with a band-aid.  No follow-ups on file.

## 2023-07-16 ENCOUNTER — Telehealth: Payer: Self-pay | Admitting: Podiatry

## 2023-07-16 NOTE — Telephone Encounter (Signed)
Patient called about getting her two braces her insurance company said they do cover them but need a authorization for them. If you could contact and advise about getting the braces

## 2023-07-19 ENCOUNTER — Ambulatory Visit: Payer: Medicare HMO | Admitting: Clinical

## 2023-07-19 DIAGNOSIS — F4321 Adjustment disorder with depressed mood: Secondary | ICD-10-CM

## 2023-07-19 NOTE — Progress Notes (Signed)
[facility] Counselor/Therapist Progress Note  Patient ID: Nancy [name], MRN: [id number],    Date: 07/19/2023  Time Spent: 2:32pm - 3:18pm : 46 minutes   Treatment Type: Individual Therapy  Reported Symptoms: Patient reported worry  Mental Status Exam: Appearance:  Neat and Well Groomed     Behavior: Appropriate  Motor: Normal  Speech/Language:  Clear and Coherent  Affect: Appropriate  Mood: normal  Thought process: normal  Thought content:   WNL  Sensory/Perceptual disturbances:   WNL  Orientation: oriented to person, place, and situation  Attention: Good  Concentration: Good  Memory: WNL  Fund of knowledge:  Good  Insight:   Good  Judgment:  Good  Impulse Control: Fair   Risk Assessment: Danger to Self:  No Patient denied current suicidal ideation  Self-injurious Behavior: No Danger to Others: No Patient denied current homicidal ideation Duty to Warn:no Physical Aggression / Violence:No  Access to Firearms a concern: No  Gang Involvement:No   Subjective: Patient stated, "pretty good" in response to patient's mood since last session. Patient reported her emotions feel intense at the time of the emotion and reported when reviewing her thought record patient noted the emotions were not as intense.  Patient stated, "It really was an eye opener" in response to thought record. Patient reported during a recent appointment with her podiatrist, patient was advised to stop using her treadmill. Patient reported she misses walking on her treadmill and reported she feels an improvement in mood when using her treadmill. Patient reported she has considered another low impact machine, joining a gym, and other alternatives. Patient reported she has been worried about losing a friendship. Patient reported her friend has been asking patient's perspective on situations. Patient reported when she discloses her perspective to her friend, patient's feelings are hurt by friend's  response.  Patient reported she tries to avoid responding when her friend asks for patient's perspective. Patient reported feeling her friend's response can be somewhat hostile.  Patient stated, "I find it odd that she won't give me the space to be myself". Patient reported she has tried to establish boundaries with her friend and feels her friend has been offended in response. Patient reported she plans to practice using "I" statements when communicating with her friend. Patient stated, "the friendship has become uncomfortable".  Patient stated, "I feel like my mood is ok".  Interventions: Cognitive Behavioral Therapy and Interpersonal. Clinician conducted session via caregility video from clinician's home office. Patient provided verbal consent to proceed with telehealth session and is aware of limitations of telephone or video visits. Patient participated in session from patient's home. Discussed patient's thoughts and feelings associated with patient's thought record. Discussed patient's recent appointment with her podiatrist and explored alternatives to patient using her treadmill. Discussed recent conflict with patient's friend, patient's response, and strategies to approach friend's requests for patient's perspective in the future. Explored barriers to patient disclosing her thoughts/feelings and establishing boundaries with her friend. Provided psycho education related to use "I" statements. Assessed patient's current mood. Clinician requested patient continue thought record for homework.     Collaboration of Care: Other not required at this time   Diagnosis:  Adjustment disorder with depressed mood     Plan: Patient is to utilize [employer] Therapy, thought re-framing, behavioral activation, and coping strategies to decrease symptoms associated with their diagnosis. Frequency: bi-weekly  Modality: individual      Long-term goal:   Patient identified a long term goal to enroll in  college  courses/program for creative writing.  Target Date: 06/03/24  Progress: 0    Short-term goal:  Develop and implement coping strategies to decrease food intake when feeling frustrated, in response to interpersonal conflict, or in response to feelings of isolation   Target Date: 12/04/23  Progress: progressing    Identify and increase patient's participation in activities patient enjoys, such as, pursuing culinary interests, travel, knitting, crocheting, learning to draw Target Date: 12/04/23  Progress: progressing    Develop and implement coping strategies to decrease the intensity and frequency of symptoms related to anxiety and depression Target Date: 12/04/23  Progress: progressing    Develop and utilize effective communication strategies to implement and maintain healthy boundaries with others Target Date: 12/04/23  Progress: progressing    Doree Barthel, LCSW

## 2023-07-19 NOTE — Progress Notes (Signed)
                Karen Sharpe, LCSW 

## 2023-07-25 LAB — HM DIABETES EYE EXAM

## 2023-07-26 DIAGNOSIS — H538 Other visual disturbances: Secondary | ICD-10-CM | POA: Diagnosis not present

## 2023-07-26 DIAGNOSIS — Z961 Presence of intraocular lens: Secondary | ICD-10-CM | POA: Diagnosis not present

## 2023-08-03 ENCOUNTER — Ambulatory Visit: Payer: Medicare HMO | Admitting: Obstetrics and Gynecology

## 2023-08-03 ENCOUNTER — Encounter: Payer: Self-pay | Admitting: Obstetrics and Gynecology

## 2023-08-03 VITALS — BP 139/63 | HR 60 | Ht 65.0 in | Wt 247.4 lb

## 2023-08-03 DIAGNOSIS — N9089 Other specified noninflammatory disorders of vulva and perineum: Secondary | ICD-10-CM | POA: Diagnosis not present

## 2023-08-03 NOTE — Progress Notes (Signed)
GYNECOLOGY OFFICE NOTE  History:  72 y.o. G2P0020 here today for "knot in vagina". Has been occurring for 3-4 months, has popped a couple of times but grown back. Tiny bit of blood came out. The "big" one is on the left side, and a "tiny" one on the right side. In the vagina. Has never occurred before. Not painful but uncomfortable.   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   Complication of anesthesia    pt states she woke up during a tonsillectomy when she was 28   Hyperlipidemia    Hypertension    Hypothyroidism 1980   Pre-diabetes    Prediabetes    Thyroid disease     Past Surgical History:  Procedure Laterality Date   BREAST SURGERY     CHOLECYSTECTOMY     COSMETIC SURGERY  1985   EYE SURGERY  2023   HERNIA REPAIR  2023   LIPOMA EXCISION N/A 06/10/2022   Procedure: EXCISION ABDOMINAL WALL SEROMA;  Surgeon: Peggye Form, DO;  Location: [facility];  Service: Plastics;  Laterality: N/A;  45 MINUTES   NASAL SEPTUM SURGERY  1990   PANNICULECTOMY N/A 10/20/2021   Procedure: PANNICULECTOMY;  Surgeon: Peggye Form, DO;  Location: [facility] OR;  Service: Plastics;  Laterality: N/A;  3.5 hours   REDUCTION MAMMAPLASTY     TONSILLECTOMY     VENTRAL HERNIA REPAIR N/A 10/20/2021   Procedure: HERNIA REPAIR VENTRAL ADULT WITH MESH;  Surgeon: Abigail Miyamoto, MD;  Location: [facility] OR;  Service: General;  Laterality: N/A;     Current Outpatient Medications:    atorvastatin (LIPITOR) 20 MG tablet, TAKE ONE TABLET BY MOUTH EVERYDAY AT BEDTIME FOR CHOLESTEROL, Disp: 90 tablet, Rfl: 1   azelastine (OPTIVAR) 0.05 % ophthalmic solution, INSTILL ONE DROP IN [facility] EYE TWICE DAILY Strength: 0.05 %, Disp: 6 mL, Rfl: 3   blood glucose meter kit and supplies, Dispense based on patient and insurance preference. Use up to four times daily as directed. (FOR ICD-10 E10.9, E11.9)., Disp: 1 each, Rfl:  3   Blood Glucose Monitoring Suppl (ACCU-CHEK GUIDE) w/Device KIT, 1 Device by Does not apply route daily., Disp: 1 kit, Rfl: 0   Blood Pressure Monitor KIT, USE TO CHECK BLOOD PRESSURE 110, Disp: 1 kit, Rfl: 2   Cholecalciferol 125 MCG (5000 UT) TABS, Take 5,000 Units by mouth daily., Disp: , Rfl:    desoximetasone (TOPICORT) 0.25 % cream, Apply to skin daily safe to use on face and skin folds, Disp: 100 g, Rfl: 4   Flaxseed, Linseed, (FLAXSEED OIL) 1000 MG CAPS, Take by mouth., Disp: , Rfl:    GLUCOSAMINE CHONDROITIN COMPLX PO, Take 1 capsule by mouth daily. 1500 mg/1200 mg, Disp: , Rfl:    glucose blood (ONETOUCH ULTRA) test strip, USE AS INSTRUCTED, Disp: 100 strip, Rfl: 12   Lactobacillus (PROBIOTIC ACIDOPHILUS PO), Take 0.5 mg by mouth daily., Disp: , Rfl:    Lancets (ONETOUCH ULTRASOFT) lancets, USE TO check blood sugar DAILY, Disp: 100 each, Rfl: 5   levothyroxine (SYNTHROID) 150 MCG tablet, TAKE ONE TABLET BY MOUTH BEFORE BREAKFAST, Disp: 90 tablet, Rfl: 1   Magnesium 250 MG TABS, Take 250 mg by mouth daily., Disp: , Rfl:    Multiple Vitamins-Minerals (MULTIVITAMIN WOMEN 50+) TABS, Take 1 tablet by mouth daily., Disp: , Rfl:    spironolactone (ALDACTONE) 50 MG tablet, TAKE ONE  TABLET BY MOUTH ONCE DAILY, Disp: 90 tablet, Rfl: 1  The following portions of the patient's history were reviewed and updated as appropriate: allergies, current medications, past family history, past medical history, past social history, past surgical history and problem list.   Review of Systems:  Pertinent items noted in HPI and remainder of comprehensive ROS otherwise negative.   Objective:  Physical Exam BP 139/63   Pulse 60   Ht 5\' 5"  (1.651 m)   Wt 112.2 kg   BMI 41.17 kg/m  CONSTITUTIONAL: Well-developed, well-nourished female in no acute distress.  HENT:  Normocephalic, atraumatic. External right and left ear normal. Oropharynx is clear and moist EYES: Conjunctivae and EOM are normal. Pupils are  equal, round, and reactive to light. No scleral icterus.  NECK: Normal range of motion, supple, no masses SKIN: Skin is warm and dry. No rash noted. Not diaphoretic. No erythema. No pallor. NEUROLOGIC: Alert and oriented to person, place, and time. Normal reflexes, muscle tone coordination. No cranial nerve deficit noted. PSYCHIATRIC: Normal mood and affect. Normal behavior. Normal judgment and thought content. CARDIOVASCULAR: Normal heart rate noted RESPIRATORY: Effort normal, no problems with respiration noted ABDOMEN: Soft, no distention noted.   PELVIC: Normal appearing external genitalia with 2 x 3 cm sebaceous appearing cyst just to left of introitus on vulva, 1 x 1 cm sebaceous appearing cyst right of labia majora, both freely mobile and non-tender, non-fluctuant. MUSCULOSKELETAL: Normal range of motion. No edema noted.  Exam done with chaperone present.  Labs and Imaging DG Foot Complete Right  Result Date: 07/14/2023 Please see detailed radiograph report in office note.   Assessment & Plan:   1. Vulvar lesion - Patient with two sebaceous appearing cysts on the vulva, one 2x3 and one 1x1 cm. I reviewed that incising them to remove cystic material would improve the appearance but would likely not be a long term solution as the cyst wall being stretched out would likely cause recurrence. Reviewed would recommend removing them altogether as they are bothersome and have recurred/grown in size. Although this could potentially be done under local anesthesia, given size, would be hesitant to perform in the office as this would require sutures/cautery and there is potential for significant bleeding. We discussed that removal of entire cyst would prevent recurrence. Reviewed that my concern for malignancy is low.  - Will refer for 2nd opinion for removal in office vs OR given size of cysts and have her do warm compresses in the meantime to encourage drainage   Routine preventative health  maintenance measures emphasized. Please refer to After Visit Summary for other counseling recommendations.   Return in about 4 weeks (around 08/31/2023).   Baldemar Lenis, MD, [facility] Attending Center for [facility])

## 2023-08-03 NOTE — Progress Notes (Signed)
Pt c/o knot/bump on the vagina for 4 months. Denies pain. Pt reports oozing periodically. Treated with antibiotics by PCP 2 months ago but bump is recurring.

## 2023-08-06 ENCOUNTER — Ambulatory Visit (INDEPENDENT_AMBULATORY_CARE_PROVIDER_SITE_OTHER): Payer: Medicare HMO | Admitting: Clinical

## 2023-08-06 DIAGNOSIS — F4323 Adjustment disorder with mixed anxiety and depressed mood: Secondary | ICD-10-CM

## 2023-08-06 NOTE — Progress Notes (Signed)
[facility] Counselor/Therapist Progress Note  Patient ID: Nancy [name], MRN: [id number],    Date: 08/06/2023  Time Spent: 1:32pm - 2:17pm : 45 minutes  Treatment Type: Individual Therapy  Reported Symptoms: Patient reported feeling anxious since last session  Mental Status Exam: Appearance:  Neat and Well Groomed     Behavior: Appropriate  Motor: Normal  Speech/Language:  Clear and Coherent  Affect: Appropriate  Mood: anxious  Thought process: normal  Thought content:   WNL  Sensory/Perceptual disturbances:   WNL  Orientation: oriented to person, place, and situation  Attention: Good  Concentration: Good  Memory: WNL  Fund of knowledge:  Good  Insight:   Fair  Judgment:  Good  Impulse Control: Fair   Risk Assessment: Danger to Self:  No Patient denied current suicidal ideation  Self-injurious Behavior: No Danger to Others: No Patient denied current homicidal ideation Duty to Warn:no Physical Aggression / Violence:No  Access to Firearms a concern: No  Gang Involvement:No   Subjective: Patient stated, "I haven't done it" in response to patient's homework to complete a thought record. Patient reported she was sick for 5 days since last session. Patient stated, "I've kind of been in a funk about it" in response to recent increase in COVID cases. Patient reported when she considers going out in public and being around others she experiences the thought, "no matter what you write you're not going out".  Patient reported feeling more comfortable wearing a mask but reported the only way she would feel more comfortable wearing a mask in public is if she had a medical grade mask. Patient reported her sister died in February 28, 2024from COVID and RSV. Patient reported she had another sister die in 2020 from COVID. Patient stated, "Im just still really really leery and still very scared" in regards to COVID. Patient reported a fear of dying from COVID. Patient stated, "I feel  like the fear is somewhat irrational". Patient stated, "not very good" in response to patient's mood since last session. Patient reported feeling anxious since last session. Patient stated, "it kind of opens up a window and I can kind of see the light on it" in response to feeling of anxiety and challenging thought distortions/negative thoughts.   Interventions: Cognitive Behavioral Therapy. Clinician conducted session via caregility video from clinician's home office. Patient provided verbal consent to proceed with telehealth session and is aware of limitations of telephone or video visits. Patient participated in session from patient's home. Reviewed patient's thought record. Explored and identified barriers to patient completing her thought record. Assisted patient in exploring and identifying thought distortions/negative thoughts. Assisted patient in challenging thought distortions identified during today's session. Discussed patient initiating a conversation with her physician to discuss patient's concerns related to COVID and patient being out in the community. Provided psycho education related to cycle of anxiety and the benefits of maintaining a thought record. Clinician requested for homework patient document her thoughts related to what she wants the next chapter of her life to look like.      Collaboration of Care: Other not required at this time   Diagnosis:  Adjustment disorder with mixed anxiety and depressed mood     Plan: Patient is to utilize [employer] Therapy, thought re-framing, behavioral activation, and coping strategies to decrease symptoms associated with their diagnosis. Frequency: bi-weekly  Modality: individual      Long-term goal:   Patient identified a long term goal to enroll in college courses/program for creative writing.  Target Date: 06/03/24  Progress: 0    Short-term goal:  Develop and implement coping strategies to decrease food intake when feeling  frustrated, in response to interpersonal conflict, or in response to feelings of isolation   Target Date: 12/04/23  Progress: progressing    Identify and increase patient's participation in activities patient enjoys, such as, pursuing culinary interests, travel, knitting, crocheting, learning to draw Target Date: 12/04/23  Progress: progressing    Develop and implement coping strategies to decrease the intensity and frequency of symptoms related to anxiety and depression Target Date: 12/04/23  Progress: progressing    Develop and utilize effective communication strategies to implement and maintain healthy boundaries with others Target Date: 12/04/23  Progress: progressing    Doree Barthel, LCSW

## 2023-08-06 NOTE — Progress Notes (Signed)
                Karen Sharpe, LCSW 

## 2023-08-11 ENCOUNTER — Encounter: Payer: Self-pay | Admitting: Podiatry

## 2023-08-11 ENCOUNTER — Ambulatory Visit: Payer: Medicare HMO | Admitting: Podiatry

## 2023-08-11 DIAGNOSIS — M62469 Contracture of muscle, unspecified lower leg: Secondary | ICD-10-CM

## 2023-08-11 DIAGNOSIS — M722 Plantar fascial fibromatosis: Secondary | ICD-10-CM | POA: Diagnosis not present

## 2023-08-11 NOTE — Progress Notes (Signed)
Subjective:  Patient ID: Nancy [name], female    DOB: 1951/06/05,  MRN: [id number]  Chief Complaint  Patient presents with   Plantar Fasciitis    RM13: Patient is here for foot pain for the last month    72 y.o. female presents with the above complaint.  Patient presents for follow-up of bilateral plantar fasciitis pain.  She states that the pain is doing better with the injection she is here to discuss next treatment plan.  She also would like to get braces as they were not available last time.  Review of Systems: Negative except as noted in the HPI. Denies N/V/F/Ch.  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 Use up to four times daily as directed. (FOR ICD-10 E10.9, E11.9)., Disp: 1 each, Rfl: 3   Blood Glucose Monitoring Suppl (ACCU-CHEK GUIDE) w/Device KIT, 1 Device by Does not apply route daily., Disp: 1 kit, Rfl: 0   Blood Pressure Monitor KIT, USE TO CHECK BLOOD PRESSURE 110, Disp: 1 kit, Rfl: 2   Cholecalciferol 125 MCG (5000 UT) TABS, Take 5,000 Units by mouth daily., Disp: , Rfl:    desoximetasone (TOPICORT) 0.25 % cream, Apply to skin daily safe to use on face and skin folds, Disp: 100 g, Rfl: 4   Flaxseed, Linseed, (FLAXSEED OIL) 1000 MG CAPS, Take by mouth., Disp: , Rfl:    GLUCOSAMINE CHONDROITIN COMPLX PO, Take 1  capsule by mouth daily. 1500 mg/1200 mg, Disp: , Rfl:    glucose blood (ONETOUCH ULTRA) test strip, USE AS INSTRUCTED, Disp: 100 strip, Rfl: 12   Lactobacillus (PROBIOTIC ACIDOPHILUS PO), Take 0.5 mg by mouth daily., Disp: , Rfl:    Lancets (ONETOUCH ULTRASOFT) lancets, USE TO check blood sugar DAILY, Disp: 100 each, Rfl: 5   levothyroxine (SYNTHROID) 150 MCG tablet, TAKE ONE TABLET BY MOUTH BEFORE BREAKFAST, Disp: 90 tablet, Rfl: 1   Magnesium 250 MG TABS, Take 250 mg by mouth daily., Disp: , Rfl:    Multiple Vitamins-Minerals (MULTIVITAMIN WOMEN 50+) TABS, Take 1 tablet by mouth daily., Disp: , Rfl:    spironolactone (ALDACTONE) 50 MG tablet, TAKE ONE TABLET BY MOUTH ONCE DAILY, Disp: 90 tablet, Rfl: 1  Social History   Tobacco Use  Smoking Status Former   Current packs/day: 0.00   Average packs/day: 1 pack/day for 1 year (1.0 ttl pk-yrs)   Types: Cigarettes   Start date: 12/22/2007   Quit date: 12/21/2008   Years since quitting: 14.6  Smokeless Tobacco Never    No Known Allergies Objective:   There were no vitals filed for this visit.  There is no height or weight on file to calculate BMI. Constitutional Well developed. Well nourished.  Vascular Dorsalis pedis pulses palpable bilaterally. Posterior tibial pulses palpable  bilaterally. Capillary refill normal to all digits.  No cyanosis or clubbing noted. Pedal hair growth normal.  Neurologic Normal speech. Oriented to person, place, and time. Epicritic sensation to light touch grossly present bilaterally.  Dermatologic Nails well groomed and normal in appearance. No open wounds. No skin lesions.  Orthopedic: Normal joint ROM without pain or crepitus bilaterally. No visible deformities. Tender to palpation at the calcaneal tuber bilaterally. No pain with calcaneal squeeze bilaterally. Ankle ROM diminished range of motion bilaterally. Silfverskiold Test: positive bilaterally.   Radiographs: 3 views of sketcher mature the  bilateral foot:Plantar and posterior heel spurring noted midfoot arthritis noted pes planovalgus foot structure noted  Assessment:   No diagnosis found.  Plan:  Patient was evaluated and treated and all questions answered.  Plantar Fasciitis, bilaterally with underlying gastrocnemius equinus- XR reviewed as above.  - Educated on icing and stretching. Instructions given.  -Second injection delivered to the plantar fascia as below. - DME: Plantar fascial brace dispensed to support the medial longitudinal arch of the foot and offload pressure from the heel and prevent arch collapse during weightbearing - Pharmacologic management: None  Procedure: Injection Tendon/Ligament Location: Bilateral plantar fascia at the glabrous junction; medial approach. Skin Prep: alcohol Injectate: 0.5 cc 0.5% marcaine plain, 0.5 cc of 1% Lidocaine, 0.5 cc kenalog 10. Disposition: Patient tolerated procedure well. Injection site dressed with a band-aid.  No follow-ups on file.

## 2023-08-20 ENCOUNTER — Ambulatory Visit: Payer: Medicare HMO | Admitting: Clinical

## 2023-08-20 DIAGNOSIS — F4323 Adjustment disorder with mixed anxiety and depressed mood: Secondary | ICD-10-CM | POA: Diagnosis not present

## 2023-08-20 NOTE — Progress Notes (Signed)
[facility] Counselor/Therapist Progress Note  Patient ID: Nancy [name], MRN: [id number],    Date: 08/20/2023  Time Spent: 9:34 am - 10:18 am : 44 minutes   Treatment Type: Individual Therapy  Reported Symptoms: Patient reported feeling anxious  Mental Status Exam: Appearance:  Neat and Well Groomed     Behavior: Appropriate  Motor: Normal  Speech/Language:  Clear and Coherent  Affect: Appropriate  Mood: anxious  Thought process: normal  Thought content:   WNL  Sensory/Perceptual disturbances:   WNL  Orientation: oriented to person, place, and situation  Attention: Good  Concentration: Good  Memory: WNL  Fund of knowledge:  Good  Insight:   Fair  Judgment:  Good  Impulse Control: Good   Risk Assessment: Danger to Self:  No Patient denied current suicidal ideation  Self-injurious Behavior: No Danger to Others: No Patient denied current homicidal ideation Duty to Warn:no Physical Aggression / Violence:No  Access to Firearms a concern: No  Gang Involvement:No   Subjective: Patient stated,"they've been going pretty good, really busy" in response to events since last session. Patient reported her friend recently visited and patient stated, "it was a good time".  Patient reported her friend's recent visit prompted patient to think about how much she misses her family and friends on the [address] and is considering relocating to the [employer]. Patient reported she moved to [place] to be close to her brother but reported she does not see her brother frequently. Patient stated, "I've been feeling better" in response to patient's mood. Patient reported she feels waking up in pain was affecting her mood. Patient stated, "not having that same intense amount of pain has lifted my mood". Patient reported being isolated in her home and grief are triggers for decline in mood. Patient reported she would like to obtain a job and is exploring teaching opportunities.  Patient reported she would like to travel, have friends visit more frequently, would like to go to the movies, go to the bookstore and read, and go to the gym. Patient reported she plans to start taking smaller steps to work towards participating in activities patient enjoys.  Patient stated, "I think I'd like to work on the gym one in particular". Patient reported she plans to choose one activity and start working towards participating in the activity. Patient stated, "I'm feeling a little bit of anxiety thinking about it" in response to participating in activities.   Interventions: Cognitive Behavioral Therapy and behavioral activation . Clinician conducted session via caregility video from clinician's home office. Patient provided verbal consent to proceed with telehealth session and is aware of limitations of telephone or video visits. Patient participated in session from patient's home. Reviewed events since last session. Assessed patient's mood since last session and patient's current mood. Explored and identified recent triggers for decline in mood. Reviewed patient's homework. Provided psycho education related to depression, behavioral activation, and dividing activities/goals into smaller tasks. Explored strategies to increase patient's activity level and improve mood, such as, patient reading outside and going to locations of interest/participating in activities at a time when there are fewer people present. Clinician requested for homework patient identify one activity, divide the activity into smaller steps, and work towards completing the activity.      Collaboration of Care: Other not required at this time   Diagnosis:  Adjustment disorder with mixed anxiety and depressed mood     Plan: Patient is to utilize [employer] Therapy, thought re-framing, behavioral activation, and  coping strategies to decrease symptoms associated with their diagnosis. Frequency: bi-weekly  Modality:  individual      Long-term goal:   Patient identified a long term goal to enroll in college courses/program for creative writing.  Target Date: 06/03/24  Progress: progressing    Short-term goal:  Develop and implement coping strategies to decrease food intake when feeling frustrated, in response to interpersonal conflict, or in response to feelings of isolation   Target Date: 12/04/23  Progress: progressing    Identify and increase patient's participation in activities patient enjoys, such as, pursuing culinary interests, travel, knitting, crocheting, learning to draw Target Date: 12/04/23  Progress: progressing    Develop and implement coping strategies to decrease the intensity and frequency of symptoms related to anxiety and depression Target Date: 12/04/23  Progress: progressing    Develop and utilize effective communication strategies to implement and maintain healthy boundaries with others Target Date: 12/04/23  Progress: progressing     Doree Barthel, LCSW

## 2023-08-20 NOTE — Progress Notes (Signed)
                Karen Sharpe, LCSW 

## 2023-08-27 ENCOUNTER — Other Ambulatory Visit: Payer: Self-pay | Admitting: Nurse Practitioner

## 2023-08-27 DIAGNOSIS — I10 Essential (primary) hypertension: Secondary | ICD-10-CM

## 2023-08-27 DIAGNOSIS — E039 Hypothyroidism, unspecified: Secondary | ICD-10-CM

## 2023-09-03 ENCOUNTER — Ambulatory Visit: Payer: Medicare HMO | Admitting: Clinical

## 2023-09-03 DIAGNOSIS — F4323 Adjustment disorder with mixed anxiety and depressed mood: Secondary | ICD-10-CM | POA: Diagnosis not present

## 2023-09-03 NOTE — Progress Notes (Signed)
[facility] Counselor/Therapist Progress Note  Patient ID: Nancy [name], MRN: [id number],    Date: 09/03/2023  Time Spent: 8:34am - 9:16am : 42 minutes   Treatment Type: Individual Therapy  Reported Symptoms: Patient reported recent improvement in mood  Mental Status Exam: Appearance:  Neat and Well Groomed     Behavior: Appropriate  Motor: Normal  Speech/Language:  Clear and Coherent  Affect: Appropriate  Mood: normal  Thought process: normal  Thought content:   WNL  Sensory/Perceptual disturbances:   WNL  Orientation: oriented to person, place, and situation  Attention: Good  Concentration: Good  Memory: WNL  Fund of knowledge:  Good  Insight:   Fair  Judgment:  Good  Impulse Control: Good   Risk Assessment: Danger to Self:  No Patient denied current suicidal ideation  Self-injurious Behavior: No Danger to Others: No Patient denied current homicidal ideation Duty to Warn:no Physical Aggression / Violence:No  Access to Firearms a concern: No  Gang Involvement:No   Subjective: Patient stated, "they've been moving" in response to events since last session. Patient reported for the first five days after patient's last therapy session, patient got up and got dressed for the gym each day and drove to different gyms to assess the amount of traffic in and out of the gyms. Patient stated, "after that I kind of got busy with people". Patient reported since last session she has spent time with friends, including going to a restaurant with a friend, and visited with a friend at the friend's home. Patient stated, "there was some calmness with it" in response to completion of patient's homework assignment. Patient stated, "I actually enjoyed the time speaking with people". Patient reported she recently met some new individuals. Patient reported she is considering asking her neighbor to accompany patient to one of the local gardens. Patient reported she feels going into the  gym for 5-10 minutes is a step she feels is feasible to complete. Patient stated, "its been better" in response to patient's mood since last session.   Interventions: Cognitive Behavioral Therapy and behavioral activation . Clinician conducted session via caregility video from clinician's home office. Patient provided verbal consent to proceed with telehealth session and is aware of limitations of telephone or video visits. Patient participated in session from patient's home. Reviewed events since last session. Reviewed patient's homework, the outcome, and patient's response. Explored and identified additional strategies to divide activities/goals into smaller tasks to work towards patient's goal of attending a gym. Explored and identified additional strategies to decrease isolation, such as, going to local gardens to read, utilizing the local greenway, going to a local park, asking neighbor to accompany patient to one of the local gardens. Assessed patient's mood since last session and patient's current mood. Provided reflective listening and validation. Clinician requested for homework patient continue to divide identified activity into smaller steps, and work towards completing the activity.     Collaboration of Care: Other not required at this time   Diagnosis:  Adjustment disorder with mixed anxiety and depressed mood     Plan: Patient is to utilize [employer] Therapy, thought re-framing, behavioral activation, and coping strategies to decrease symptoms associated with their diagnosis. Frequency: bi-weekly  Modality: individual      Long-term goal:   Patient identified a long term goal to enroll in college courses/program for creative writing.  Target Date: 06/03/24  Progress: progressing    Short-term goal:  Develop and implement coping strategies to decrease food intake when feeling  frustrated, in response to interpersonal conflict, or in response to feelings of isolation    Target Date: 12/04/23  Progress: progressing    Identify and increase patient's participation in activities patient enjoys, such as, pursuing culinary interests, travel, knitting, crocheting, learning to draw Target Date: 12/04/23  Progress: progressing    Develop and implement coping strategies to decrease the intensity and frequency of symptoms related to anxiety and depression Target Date: 12/04/23  Progress: progressing    Develop and utilize effective communication strategies to implement and maintain healthy boundaries with others Target Date: 12/04/23  Progress: progressing     Doree Barthel, LCSW

## 2023-09-03 NOTE — Progress Notes (Signed)
                Dezi Schaner, LCSW 

## 2023-09-06 ENCOUNTER — Ambulatory Visit (INDEPENDENT_AMBULATORY_CARE_PROVIDER_SITE_OTHER): Payer: Medicare HMO | Admitting: Obstetrics and Gynecology

## 2023-09-06 ENCOUNTER — Encounter: Payer: Self-pay | Admitting: Obstetrics and Gynecology

## 2023-09-06 VITALS — BP 131/81 | HR 83 | Wt 248.0 lb

## 2023-09-06 DIAGNOSIS — N907 Vulvar cyst: Secondary | ICD-10-CM

## 2023-09-06 DIAGNOSIS — L723 Sebaceous cyst: Secondary | ICD-10-CM

## 2023-09-06 NOTE — Progress Notes (Signed)
72 yo returning for the evaluation and management of vulva sebaceous cysts. Patient reports some improvement with warm compresses in the sense that she has not noticed any more bleeding. No change in size. She remains asymptomatic and denies any pain. 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   Complication of anesthesia    pt states she woke up during a tonsillectomy when she was 28   Hyperlipidemia    Hypertension    Hypothyroidism 1980   Pre-diabetes    Prediabetes    Thyroid disease    Past Surgical History:  Procedure Laterality Date   BREAST SURGERY     CHOLECYSTECTOMY     COSMETIC SURGERY  1985   EYE SURGERY  2023   HERNIA REPAIR  2023   LIPOMA EXCISION N/A 06/10/2022   Procedure: EXCISION ABDOMINAL WALL SEROMA;  Surgeon: Peggye Form, DO;  Location: [facility];  Service: Plastics;  Laterality: N/A;  45 MINUTES   NASAL SEPTUM SURGERY  1990   PANNICULECTOMY N/A 10/20/2021   Procedure: PANNICULECTOMY;  Surgeon: Peggye Form, DO;  Location: [facility] OR;  Service: Plastics;  Laterality: N/A;  3.5 hours   REDUCTION MAMMAPLASTY     TONSILLECTOMY     VENTRAL HERNIA REPAIR N/A 10/20/2021   Procedure: HERNIA REPAIR VENTRAL ADULT WITH MESH;  Surgeon: Abigail Miyamoto, MD;  Location: [facility] OR;  Service: General;  Laterality: N/A;   Family History  Problem Relation Age of Onset   Diabetes Mother    Hypertension Mother    Hypertension Father    Heart disease Father    Diabetes Sister    Kidney failure Sister    Diabetes Brother    Kidney disease Brother    Diabetes Sister    Diabetes Sister    Asthma Sister    Cancer Maternal Grandfather    Cancer Paternal Grandfather     Social History   Tobacco Use   Smoking status: Former    Current packs/day: 0.00    Average packs/day: 1 pack/day for 1 year  (1.0 ttl pk-yrs)    Types: Cigarettes    Start date: 12/22/2007    Quit date: 12/21/2008    Years since quitting: 14.7   Smokeless tobacco: Never  Vaping Use   Vaping status: Never Used  Substance Use Topics   Alcohol use: Not Currently    Comment: rare   Drug use: Never   ROS See pertinent in HPI. All other systems reviewed and non contributory Blood pressure 131/81, pulse 83, weight 248 lb (112.5 kg). GENERAL: Well-developed, well-nourished female in no acute distress.  ABDOMEN: Soft, nontender, nondistended. No organomegaly. PELVIC: Normal external female genitalia with a 2 cm sebaceous cyst on left labia majora and smaller subcentimer similar lesion on superior aspect of right labia majora. Chaperone present during the pelvic exam EXTREMITIES: No cyanosis, clubbing, or edema, 2+ distal pulses.  A/P 72 yo with vulva sebaceous cyst requesting removal - Reassurance provided on type of lesion - After informed consent was obtained, lidocaine was injected over the cyst at planned incision sites. A small incision was performed allowing for removal of cyst content. Patient tolerated the procedure well. Hemostasis was obtained with 3.0 vicryl interrupted suture. Precautions and care instructions reviewed with the patient - RTC prn

## 2023-09-06 NOTE — Progress Notes (Signed)
Pt is in office for follow up for vaginal cyst -here for re eval/ ?removal.

## 2023-09-22 ENCOUNTER — Encounter: Payer: Self-pay | Admitting: Nurse Practitioner

## 2023-09-24 ENCOUNTER — Ambulatory Visit: Payer: Medicare HMO | Admitting: Clinical

## 2023-09-24 DIAGNOSIS — F4323 Adjustment disorder with mixed anxiety and depressed mood: Secondary | ICD-10-CM | POA: Diagnosis not present
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
## 2023-09-24 NOTE — Progress Notes (Signed)
                Dezi Schaner, LCSW 

## 2023-10-07 DIAGNOSIS — H1132 Conjunctival hemorrhage, left eye: Secondary | ICD-10-CM | POA: Diagnosis not present

## 2023-10-08 ENCOUNTER — Ambulatory Visit: Payer: Medicare HMO | Admitting: Clinical

## 2023-10-08 DIAGNOSIS — F4323 Adjustment disorder with mixed anxiety and depressed mood: Secondary | ICD-10-CM
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
## 2023-10-08 NOTE — Progress Notes (Signed)
                Dezi Schaner, LCSW 

## 2023-10-22 ENCOUNTER — Ambulatory Visit: Payer: Medicare HMO | Admitting: Clinical

## 2023-10-22 DIAGNOSIS — F4323 Adjustment disorder with mixed anxiety and depressed mood: Secondary | ICD-10-CM | POA: Diagnosis not present

## 2023-10-22 NOTE — Progress Notes (Signed)
                Dezi Schaner, LCSW 

## 2023-10-22 NOTE — Progress Notes (Signed)
[facility] Counselor/Therapist Progress Note  Patient ID: Nancy [name], MRN: [id number],    Date: 10/22/2023  Time Spent: 8:32am - 9:21am : 49 minutes   Treatment Type: Individual Therapy  Reported Symptoms: Patient reported feeling exhausted  Mental Status Exam: Appearance:  Neat and Well Groomed     Behavior: Appropriate  Motor: Normal  Speech/Language:  Clear and Coherent  Affect: Appropriate  Mood: Patient stated, "I'm pensive a lot"  Thought process: normal  Thought content:   WNL  Sensory/Perceptual disturbances:   WNL  Orientation: oriented to person, place, and situation  Attention: Good  Concentration: Good  Memory: WNL  Fund of knowledge:  Good  Insight:   Good  Judgment:  Good  Impulse Control: Good   Risk Assessment: Danger to Self:  No Patient denied current suicidal ideation  Self-injurious Behavior: No Danger to Others: No Patient denied current homicidal ideation Duty to Warn:no Physical Aggression / Violence:No  Access to Firearms a concern: No  Gang Involvement:No   Subjective: Patient stated, "they've been really busy and I have to confess I haven't done any of my homework".  Patient reported a recent death in the family. Patient reported she has been spending a significant amount of time talking with friends and family on the phone. Patient reported patient's friend accidentally called patient and patient overheard friend "on a rant". Patient reported she thought patient/friend were having a conversation before  patient realized friend had accidentally called patient. Patient reported friend stated she was hurt and stated she felt patient was invading her privacy by remaining on the phone. Patient reported she blocked the friend in response to the situation and later unblocked friend. Patient reported her friend is currently involved in a court case against friend's neighbor. Patient stated, "I thought I might as well help her through this  (court case)". Patient reported she has been trying to be a support to her friend. Patient stated, "there are so many times she undercuts me". Patient reported her friend has made suicidal statements in the past, and patient reported she has been afraid for her friend's "mental condition" in the past. Patient stated, "I have to find a way to break this, she's consuming so much of my time".  Patient reported she dreads text notifications for fear that it may be friend texting patient. Patient reported she dreads opening her door for fear friend may be outside of patient's home. Patient stated, "Im just at my wits end". Patient stated, "Its really wearing me down, I'm exhausted today", "it felt so good to kind of vent".   Interventions: Cognitive Behavioral Therapy and Interpersonal. Clinician conducted session via caregility video from clinician's home office. Patient provided verbal consent to proceed with telehealth session and is aware of limitations of telephone or video visits. Patient participated in session from a vehicle. Reviewed events since last session. Discussed patient's thought record and explored barriers to completing thought record.  Provided supportive therapy, active listening, and validation as patient discussed recent conflict with patient's friend and patient's response to the conflict. Discussed dynamics of patient's relationship with friend and assisted patient in exploring and identifying thoughts/feelings associated with the friendship. Provided psycho education related to establishing and maintaining boundaries. Discussed with patient resources for patient to utilize in the event patient becomes concerned for friend's safety in the future, such as, calling 911, taking friend to behavioral health urgent care. Clinician requested for homework patient continue thought record and identify evidence for/against distortions.  Collaboration of Care: Other not required at this time    Diagnosis:  Adjustment disorder with mixed anxiety and depressed mood     Plan: Patient is to utilize [employer] Therapy, thought re-framing, behavioral activation, and coping strategies to decrease symptoms associated with their diagnosis. Frequency: bi-weekly  Modality: individual      Long-term goal:   Patient identified a long term goal to enroll in college courses/program for creative writing.  Target Date: 06/03/24  Progress: progressing    Short-term goal:  Develop and implement coping strategies to decrease food intake when feeling frustrated, in response to interpersonal conflict, or in response to feelings of isolation   Target Date: 12/04/23  Progress: progressing    Identify and increase patient's participation in activities patient enjoys, such as, pursuing culinary interests, travel, knitting, crocheting, learning to draw Target Date: 12/04/23  Progress: progressing    Develop and implement coping strategies to decrease the intensity and frequency of symptoms related to anxiety and depression Target Date: 12/04/23  Progress: progressing    Develop and utilize effective communication strategies to implement and maintain healthy boundaries with others Target Date: 12/04/23  Progress: progressing     Nancy Barthel, LCSW

## 2023-11-05 ENCOUNTER — Ambulatory Visit: Payer: Medicare HMO | Admitting: Clinical

## 2023-11-15 ENCOUNTER — Other Ambulatory Visit: Payer: Self-pay | Admitting: Nurse Practitioner

## 2023-11-15 DIAGNOSIS — R7303 Prediabetes: Secondary | ICD-10-CM

## 2023-11-22 ENCOUNTER — Ambulatory Visit: Payer: Medicare HMO | Admitting: Clinical

## 2023-11-22 DIAGNOSIS — F4323 Adjustment disorder with mixed anxiety and depressed mood: Secondary | ICD-10-CM

## 2023-11-22 NOTE — Progress Notes (Signed)
                Dezi Schaner, LCSW 

## 2023-11-22 NOTE — Progress Notes (Signed)
[facility] Counselor/Therapist Progress Note  Patient ID: Nancy [name], MRN: [id number],    Date: 11/22/2023  Time Spent: 8:33am - 9:15am : 42 minutes   Treatment Type: Individual Therapy  Reported Symptoms: Patient reported feeling overwhelmed at times  Mental Status Exam: Appearance:  Neat and Well Groomed     Behavior: Appropriate  Motor: Normal  Speech/Language:  Clear and Coherent  Affect: Appropriate  Mood: normal  Thought process: normal  Thought content:   WNL  Sensory/Perceptual disturbances:   WNL  Orientation: oriented to person, place, and situation  Attention: Good  Concentration: Good  Memory: WNL  Fund of knowledge:  Good  Insight:   Good  Judgment:  Good  Impulse Control: Good   Risk Assessment: Danger to Self:  No Patient denied current suicidal ideation  Self-injurious Behavior: No Danger to Others: No Patient denied current homicidal ideation Duty to Warn:no Physical Aggression / Violence:No  Access to Firearms a concern: No  Gang Involvement:No   Subjective: Patient stated, "it's been pretty good" in response to events since last session.  Patient reported she cancelled her credit card due to fraudulent charges and patient reported she cancelled her last therapy session because she was waiting for her new credit card to arrive.  Patient reported she recently injured her calf and is waiting for calf to heal before resuming physical activity. Patient reported she plans to return to a gym close to her home and reactivate her silver sneakers membership. Patient reported she experiences the cognitive distortion of fortune telling. Patient stated, "sometimes I feel overwhelmed with the house and the car".   Patient reported she enjoys baking, making jewelry, would like to develop a cookbook. Patient reported she plans to stop by the senior center near patient's home to obtain information. Patient stated, "my mood has been pretty good" in response  to patient's mood since last session. Patient stated, "that's been pretty good", "I try to reel it back into reality", "when I think about what it takes to get to that worst case scenario it reels me back in" in response to patient's homework.   Interventions: Cognitive Behavioral Therapy. Clinician conducted session via caregility video from clinician's home office. Patient provided verbal consent to proceed with telehealth session and is aware of limitations of telephone or video visits. Patient participated in session from patient's home. Reviewed events since last session. Discussed patient's recent cancellation and barriers to attending session. Reviewed cognitive distortions, such as, fortune telling. Provided psycho education related to depressive symptoms, physical activity, and exercises of gratitude. Assisted patient in exploring and identifying additional activities patient finds enjoyable and strategies to incorporate enjoyable activities with exercises of gratitude, such as, teaching a baking class and volunteering. Assessed patient's mood since last session and current mood. Reviewed patient's homework. Clinician requested for homework patient continue thought record, identify evidence for/against distortions, and start a gratitude journal.      Collaboration of Care: Other not required at this time   Diagnosis:  Adjustment disorder with mixed anxiety and depressed mood     Plan: Patient is to utilize [employer] Therapy, thought re-framing, behavioral activation, and coping strategies to decrease symptoms associated with their diagnosis. Frequency: bi-weekly  Modality: individual      Long-term goal:   Patient identified a long term goal to enroll in college courses/program for creative writing.  Target Date: 06/03/24  Progress: progressing    Short-term goal:  Develop and implement coping strategies to decrease food intake when  feeling frustrated, in response to  interpersonal conflict, or in response to feelings of isolation   Target Date: 12/04/23  Progress: progressing    Identify and increase patient's participation in activities patient enjoys, such as, pursuing culinary interests, travel, knitting, crocheting, learning to draw Target Date: 12/04/23  Progress: progressing    Develop and implement coping strategies to decrease the intensity and frequency of symptoms related to anxiety and depression Target Date: 12/04/23  Progress: progressing    Develop and utilize effective communication strategies to implement and maintain healthy boundaries with others Target Date: 12/04/23  Progress: progressing    Doree Barthel, LCSW

## 2023-12-03 ENCOUNTER — Encounter: Payer: Self-pay | Admitting: Podiatry

## 2023-12-03 ENCOUNTER — Ambulatory Visit: Payer: Medicare HMO | Admitting: Podiatry

## 2023-12-03 DIAGNOSIS — M722 Plantar fascial fibromatosis: Secondary | ICD-10-CM

## 2023-12-03 NOTE — Progress Notes (Unsigned)
Subjective:  Patient ID: Nancy [name], female    DOB: [birth year],  MRN: [id number]  Chief Complaint  Patient presents with   Foot Pain    Pain states she was walking about a week ago and hurt her calf muscle , no wit is starting to heel she hurt her knees . Patient states that it is getting better , she has some questions that she wanted to ask about the brace if she needs to continue to wear it? And she has a moe at the bottom of her left foot and if doctor can keep track of it.    72 y.o. female presents with the above complaint.  Patient presents for follow-up of bilateral plantar fasciitis pain.  She states she is doing better.  The injection helped.  Bracing has helped.  Denies any other acute issues  Review of Systems: Negative except as noted in the HPI. Denies N/V/F/Ch.  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 Use up to four times daily as directed. (FOR ICD-10 E10.9, E11.9)., Disp: 1 each, Rfl: 3   Blood Glucose Monitoring Suppl (ACCU-CHEK GUIDE) w/Device KIT, 1 Device by Does not apply route daily., Disp: 1 kit, Rfl: 0   Blood Pressure Monitor KIT, USE TO CHECK BLOOD PRESSURE 110, Disp: 1 kit, Rfl: 2   Cholecalciferol 125 MCG (5000 UT) TABS, Take 5,000 Units by mouth daily., Disp: , Rfl:    desoximetasone (TOPICORT) 0.25 %  cream, Apply to skin daily safe to use on face and skin folds, Disp: 100 g, Rfl: 4   Flaxseed, Linseed, (FLAXSEED OIL) 1000 MG CAPS, Take by mouth., Disp: , Rfl:    GLUCOSAMINE CHONDROITIN COMPLX PO, Take 1 capsule by mouth daily. 1500 mg/1200 mg, Disp: , Rfl:    glucose blood (ONETOUCH ULTRA) test strip, USE TO CHECK BLOOD GLUCOSE ONCE DAILY AS DIRECTED, Disp: 100 strip, Rfl: 8   Lactobacillus (PROBIOTIC ACIDOPHILUS PO), Take 0.5 mg by mouth daily., Disp: , Rfl:    Lancets (ONETOUCH ULTRASOFT) lancets, USE TO check blood sugar DAILY, Disp: 100 each, Rfl: 5   levothyroxine (SYNTHROID) 150 MCG tablet, TAKE 1 TABLET BY MOUTH BEFORE BREAKFAST DAILY, Disp: 90 tablet, Rfl: 1   Magnesium 250 MG TABS, Take 250 mg by mouth daily., Disp: , Rfl:    Multiple Vitamins-Minerals (MULTIVITAMIN WOMEN 50+) TABS, Take 1 tablet by mouth daily., Disp: , Rfl:    spironolactone (ALDACTONE) 50 MG tablet, TAKE 1 TABLET BY MOUTH EVERY DAY, Disp: 90 tablet, Rfl: 1  Social History   Tobacco Use  Smoking Status Former   Current packs/day: 0.00   Average packs/day: 1 pack/day for 1 year (1.0 ttl pk-yrs)   Types: Cigarettes   Start date: 12/22/2007   Quit  date: 12/21/2008   Years since quitting: 14.9  Smokeless Tobacco Never    No Known Allergies Objective:   There were no vitals filed for this visit.  There is no height or weight on file to calculate BMI. Constitutional Well developed. Well nourished.  Vascular Dorsalis pedis pulses palpable bilaterally. Posterior tibial pulses palpable bilaterally. Capillary refill normal to all digits.  No cyanosis or clubbing noted. Pedal hair growth normal.  Neurologic Normal speech. Oriented to person, place, and time. Epicritic sensation to light touch grossly present bilaterally.  Dermatologic Nails well groomed and normal in appearance. No open wounds. No skin lesions.  Orthopedic: Normal joint ROM without pain or crepitus bilaterally. No visible  deformities. Very mild tender to palpation at the calcaneal tuber bilaterally. No pain with calcaneal squeeze bilaterally. Ankle ROM diminished range of motion bilaterally. Silfverskiold Test: positive bilaterally.   Radiographs: 3 views of sketcher mature the bilateral foot:Plantar and posterior heel spurring noted midfoot arthritis noted pes planovalgus foot structure noted  Assessment:   No diagnosis found.  Plan:  Patient was evaluated and treated and all questions answered.  Plantar Fasciitis, bilaterally with underlying gastrocnemius equinus- XR reviewed as above.  -Clinically healed and officially discharged from my care.  I discussed shoe gear modification.  At this time patient is able to manage her pain.  I discussed that if it starts to reoccurs or continues to get worse to come see me right away.  She states understanding.  No follow-ups on file.

## 2023-12-06 ENCOUNTER — Encounter: Payer: Medicare HMO | Admitting: Clinical

## 2023-12-06 NOTE — Progress Notes (Signed)
                Day Deery, LCSWThis encounter was created in error - please disregard. 

## 2023-12-08 ENCOUNTER — Ambulatory Visit (INDEPENDENT_AMBULATORY_CARE_PROVIDER_SITE_OTHER): Payer: Medicare HMO | Admitting: Nurse Practitioner

## 2023-12-08 ENCOUNTER — Encounter: Payer: Self-pay | Admitting: Nurse Practitioner

## 2023-12-08 VITALS — BP 120/80 | HR 67 | Temp 98.1°F | Ht 65.0 in | Wt 254.6 lb

## 2023-12-08 DIAGNOSIS — E039 Hypothyroidism, unspecified: Secondary | ICD-10-CM | POA: Diagnosis not present

## 2023-12-08 DIAGNOSIS — R7303 Prediabetes: Secondary | ICD-10-CM | POA: Diagnosis not present

## 2023-12-08 DIAGNOSIS — Z6841 Body Mass Index (BMI) 40.0 and over, adult: Secondary | ICD-10-CM

## 2023-12-08 DIAGNOSIS — E782 Mixed hyperlipidemia: Secondary | ICD-10-CM | POA: Diagnosis not present

## 2023-12-08 DIAGNOSIS — I10 Essential (primary) hypertension: Secondary | ICD-10-CM

## 2023-12-08 NOTE — Assessment & Plan Note (Signed)
Blood pressure is controlled. Continue current medications.

## 2023-12-08 NOTE — Assessment & Plan Note (Signed)
Continue current medications and f/u with Endocrinology, thyroid levels are managed by Endo

## 2023-12-08 NOTE — Progress Notes (Signed)
Nancy [name], CMA,acting as a Neurosurgeon for Nancy Felts, FNP.,have documented all relevant documentation on the behalf of Nancy Felts, FNP,as directed by  Nancy Felts, FNP while in the presence of Nancy Felts, FNP.  Subjective:  Patient ID: Nancy [name] , female    DOB: 11-10-1951 , 72 y.o.   MRN: [id number]  Chief Complaint  Patient presents with   Hypertension    HPI  Patient presents today for a bp and pre dm follow up, Patient reports compliance with medication. Patient denies any chest pain, SOB, or headaches. Patient reports she has a little white bump above her top lip she would like looked at. She strained her left calf muscle so she has not been exercising for about 2 months. She has to get rid of her treadmill per her podiatrist. Continues to go to counselor feels like is going well. Blood sugar has been 90-101 range.     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   Complication of anesthesia    pt states she woke up during a tonsillectomy when she was 28   Hyperlipidemia    Hypertension    Hypothyroidism 1980   Pre-diabetes    Prediabetes    Thyroid disease      Family History  Problem Relation Age of Onset   Diabetes Mother    Hypertension Mother    Hypertension Father    Heart disease Father    Diabetes Sister    Kidney failure Sister    Diabetes Brother    Kidney disease Brother    Diabetes Sister    Diabetes Sister    Asthma Sister    Cancer Maternal Grandfather    Cancer Paternal Grandfather      Current Outpatient Medications:    atorvastatin (LIPITOR) 20 MG tablet, TAKE 1 TABLET BY MOUTH EVERY DAY AT BEDTIME FOR CHOLESTROL, Disp: 90 tablet, Rfl: 1   azelastine (OPTIVAR) 0.05 % ophthalmic solution, INSTILL ONE DROP IN EACH EYE TWICE DAILY Strength: 0.05 %, Disp: 6 mL, Rfl: 3   blood glucose meter kit and supplies, Dispense based on patient and insurance preference. Use up  to four times daily as directed. (FOR ICD-10 E10.9, E11.9)., Disp: 1 each, Rfl: 3   Blood Glucose Monitoring Suppl (ACCU-CHEK GUIDE) w/Device KIT, 1 Device by Does not apply route daily., Disp: 1 kit, Rfl: 0   Blood Pressure Monitor KIT, USE TO CHECK BLOOD PRESSURE 110, Disp: 1 kit, Rfl: 2   Cholecalciferol 125 MCG (5000 UT) TABS, Take 5,000 Units by mouth daily., Disp: , Rfl:    desoximetasone (TOPICORT) 0.25 % cream, Apply to skin daily safe to use on face and skin folds, Disp: 100 g, Rfl: 4   Flaxseed, Linseed, (FLAXSEED OIL) 1000 MG CAPS, Take by mouth., Disp: , Rfl:    GLUCOSAMINE CHONDROITIN COMPLX PO, Take 1 capsule by mouth daily. 1500 mg/1200 mg, Disp: , Rfl:    glucose blood (ONETOUCH ULTRA) test strip, USE TO CHECK BLOOD GLUCOSE ONCE DAILY AS DIRECTED, Disp: 100 strip, Rfl: 8   Lactobacillus (PROBIOTIC ACIDOPHILUS PO), Take 0.5 mg by mouth daily., Disp: , Rfl:    Lancets (ONETOUCH ULTRASOFT) lancets, USE TO check blood sugar DAILY, Disp: 100 each, Rfl: 5   levothyroxine (SYNTHROID) 150 MCG tablet, TAKE 1 TABLET BY MOUTH BEFORE BREAKFAST DAILY, Disp: 90 tablet, Rfl: 1   Magnesium 250 MG TABS, Take 250 mg  by mouth daily., Disp: , Rfl:    Multiple Vitamins-Minerals (MULTIVITAMIN WOMEN 50+) TABS, Take 1 tablet by mouth daily., Disp: , Rfl:    spironolactone (ALDACTONE) 50 MG tablet, TAKE 1 TABLET BY MOUTH EVERY DAY, Disp: 90 tablet, Rfl: 1   No Known Allergies   Review of Systems  Constitutional: Negative.   Respiratory: Negative.    Cardiovascular: Negative.   Gastrointestinal: Negative.   Neurological: Negative.   Psychiatric/Behavioral: Negative.       Today's Vitals   12/08/23 0831  BP: 120/80  Pulse: 67  Temp: 98.1 F (36.7 C)  TempSrc: Oral  Weight: 254 lb 9.6 oz (115.5 kg)  Height: 5\' 5"  (1.651 m)  PainSc: 2   PainLoc: Leg   Body mass index is 42.37 kg/m.  Wt Readings from Last 3 Encounters:  12/08/23 254 lb 9.6 oz (115.5 kg)  09/06/23 248 lb (112.5 kg)   08/03/23 247 lb 6.4 oz (112.2 kg)     Objective:  Physical Exam Vitals reviewed.  Constitutional:      General: She is not in acute distress.    Appearance: Normal appearance.  Cardiovascular:     Rate and Rhythm: Normal rate and regular rhythm.     Pulses: Normal pulses.     Heart sounds: Normal heart sounds. No murmur heard. Pulmonary:     Effort: Pulmonary effort is normal. No respiratory distress.     Breath sounds: No wheezing.  Abdominal:     General: Abdomen is flat. Bowel sounds are normal. There is no distension.     Palpations: Abdomen is soft. There is no mass.     Tenderness: There is no abdominal tenderness.  Musculoskeletal:        General: No tenderness.     Right lower leg: No edema.     Left lower leg: No edema.  Skin:    General: Skin is warm and dry.     Capillary Refill: Capillary refill takes less than 2 seconds.     Findings: Rash (white papular vesicle to upper lip on right side) present.  Neurological:     General: No focal deficit present.     Mental Status: She is alert and oriented to person, place, and time.     Cranial Nerves: No cranial nerve deficit.     Motor: No weakness.  Psychiatric:        Mood and Affect: Mood normal.        Behavior: Behavior normal.        Thought Content: Thought content normal.        Judgment: Judgment normal.         Assessment And Plan:  Essential hypertension Assessment & Plan: Blood pressure is controlled. Continue current medications  Orders: -     BMP8+eGFR  Prediabetes Assessment & Plan: Hgba1c is stable, diet controlled.   Orders: -     BMP8+eGFR -     Hemoglobin A1c  Mixed hyperlipidemia Assessment & Plan: Cholesterol levels are stable, continue statin  Orders: -     BMP8+eGFR -     Lipid panel  Acquired hypothyroidism Assessment & Plan: Continue current medications and f/u with Endocrinology, thyroid levels are managed by Endo  Orders: -     BMP8+eGFR -     TSH + free  T4  Obesity, morbid (HCC) Assessment & Plan: She is encouraged to strive for BMI less than 30 to decrease cardiac risk. Advised to aim for at least 150 minutes of  exercise per week.    BMI 40.0-44.9, adult (HCC)   Return for 6 month bp check.  Patient was given opportunity to ask questions. Patient verbalized understanding of the plan and was able to repeat key elements of the plan. All questions were answered to their satisfaction.    Nancy Sparrow, FNP, have reviewed all documentation for this visit. The documentation on 12/08/23 for the exam, diagnosis, procedures, and orders are all accurate and complete.   IF YOU HAVE BEEN REFERRED TO A SPECIALIST, IT MAY TAKE 1-2 WEEKS TO SCHEDULE/PROCESS THE REFERRAL. IF YOU HAVE NOT HEARD FROM US/SPECIALIST IN TWO WEEKS, PLEASE GIVE [place] A CALL AT [phone].

## 2023-12-08 NOTE — Assessment & Plan Note (Signed)
 She is encouraged to strive for BMI less than 30 to decrease cardiac risk. Advised to aim for at least 150 minutes of exercise per week.

## 2023-12-08 NOTE — Assessment & Plan Note (Signed)
Cholesterol levels are stable, continue statin 

## 2023-12-08 NOTE — Assessment & Plan Note (Signed)
Hgba1c is stable, diet controlled.

## 2023-12-09 LAB — BMP8+EGFR
BUN/Creatinine Ratio: 14 (ref 12–28)
BUN: 14 mg/dL (ref 8–27)
CO2: 25 mmol/L (ref 20–29)
Calcium: 9.9 mg/dL (ref 8.7–10.3)
Chloride: 104 mmol/L (ref 96–106)
Creatinine, Ser: 1.01 mg/dL — ABNORMAL HIGH (ref 0.57–1.00)
Glucose: 91 mg/dL (ref 70–99)
Potassium: 5 mmol/L (ref 3.5–5.2)
Sodium: 143 mmol/L (ref 134–144)
eGFR: 59 mL/min/{1.73_m2} — ABNORMAL LOW (ref >59)

## 2023-12-09 LAB — LIPID PANEL
Chol/HDL Ratio: 2.8 {ratio} (ref 0.0–4.4)
Cholesterol, Total: 152 mg/dL (ref 100–199)
HDL: 55 mg/dL (ref >39)
LDL Chol Calc (NIH): 82 mg/dL (ref 0–99)
Triglycerides: 78 mg/dL (ref 0–149)
VLDL Cholesterol Cal: 15 mg/dL (ref 5–40)

## 2023-12-09 LAB — HEMOGLOBIN A1C
Est. average glucose Bld gHb Est-mCnc: 123 mg/dL
Hgb A1c MFr Bld: 5.9 % — ABNORMAL HIGH (ref 4.8–5.6)

## 2023-12-09 LAB — TSH+FREE T4
Free T4: 1.85 ng/dL — ABNORMAL HIGH (ref 0.82–1.77)
TSH: 1.22 u[IU]/mL (ref 0.450–4.500)

## 2023-12-10 ENCOUNTER — Ambulatory Visit: Payer: Medicare HMO | Admitting: Clinical

## 2023-12-10 DIAGNOSIS — F4323 Adjustment disorder with mixed anxiety and depressed mood: Secondary | ICD-10-CM | POA: Diagnosis not present

## 2023-12-10 NOTE — Progress Notes (Addendum)
[facility] Counselor/Therapist Progress Note  Patient ID: Nancy [name], MRN: [id number],    Date: 12/10/2023  Time Spent: 8:36am - 9:17am : 41 minutes   Treatment Type: Individual Therapy  Reported Symptoms: tearful when discussing friend  Mental Status Exam: Appearance:  Neat and Well Groomed     Behavior: Appropriate  Motor: Normal  Speech/Language:  Clear and Coherent  Affect: Tearful when discussing friend  Mood: normal  Thought process: normal  Thought content:   WNL  Sensory/Perceptual disturbances:   WNL  Orientation: oriented to person, place, and situation  Attention: Good  Concentration: Good  Memory: WNL  Fund of knowledge:  Good  Insight:   Good  Judgment:  Good  Impulse Control: Good   Risk Assessment:  Danger to Self:  No Patient denied current suicidal ideation  Self-injurious Behavior: No Danger to Others: No Patient denied current homicidal ideation Duty to Warn:no Physical Aggression / Violence:No  Access to Firearms a concern: No  Gang Involvement:No   Subjective: Patient reported during a recent phone conversation with a friend the friend was speaking incoherently and patient became concerned. Patient reported she called law enforcement to perform a welfare check on friend. Patient reported it was determined her friend had a stroke and is now recovering from the stroke. Patient stated "I still feel guilty about not calling an ambulance right away". Patient reported she is grateful her friend is gaining strength and grateful it is not raining or snowing. Patient stated, "my mood's been pretty good". Patient reported she did not attend a local event due to fear of getting sick. Patient reported she went to view the holiday lights and went to eat with a friend instead of going to the local event. Patient stated, "I really like it, every day it makes me stop and think", "I was really surprised how much the little things mean too","it helps me to  appreciate little things that have happened" in response to gratitude journal. Patient stated, "just looking over the things makes me smile". Patient reported she is grateful for receiving christmas cards from others, grateful she was able to recall a recent dream, grateful all of patient's doctors visit are completed for the year. Patient stated, "there's a lot to be happy for" and stated, "its working" in response to gratitude journal.  Patient stated, "its been pretty good" in response to patient's mood since last session.   Interventions: Cognitive Behavioral Therapy. Clinician conducted session via caregility video from clinician's home office. Patient provided verbal consent to proceed with telehealth session and is aware of limitations of telephone or video visits. Patient participated in session from patient's home. Reviewed events since last session. Provided supportive therapy, active listening, and validation as patient discussed recent concern for patient's friend and patient's response. Explored patient's emotional and behavioral response to recent feelings of fear. Assessed patient's mood since last session and assessed current mood. Reviewed patient's gratitude journal and patient's response to gratitude journal. Reviewed patient's thought record. Discussed patient's progress in therapy and provided praise for patient's progress in therapy. Clinician requested for homework patient continue thought record, identify evidence for/against distortions, and continue gratitude journal.       Collaboration of Care: Other not required at this time   Diagnosis:  Adjustment disorder with mixed anxiety and depressed mood     Plan: Patient is to utilize [employer] Therapy, thought re-framing, behavioral activation, and coping strategies to decrease symptoms associated with their diagnosis. Frequency: bi-weekly  Modality: individual  Long-term goal:   Patient identified a long term  goal to enroll in college courses/program for creative writing.  Target Date: 06/03/24  Progress: progressing    Short-term goal:  Develop and implement coping strategies to decrease food intake when feeling frustrated, in response to interpersonal conflict, or in response to feelings of isolation   Target Date: 06/03/24  Progress: progressing    Identify and increase patient's participation in activities patient enjoys, such as, pursuing culinary interests, travel, knitting, crocheting, learning to draw Target Date: 06/03/24  Progress: progressing    Develop and implement coping strategies to decrease the intensity and frequency of symptoms related to anxiety and depression Target Date: 06/03/24  Progress: progressing    Develop and utilize effective communication strategies to implement and maintain healthy boundaries with others Target Date: 06/03/24  Progress: progressing    Doree Barthel, LCSW

## 2023-12-10 NOTE — Progress Notes (Signed)
                Dezi Schaner, LCSW 

## 2023-12-31 ENCOUNTER — Ambulatory Visit: Payer: Medicare HMO | Admitting: Clinical

## 2023-12-31 DIAGNOSIS — F4323 Adjustment disorder with mixed anxiety and depressed mood: Secondary | ICD-10-CM

## 2023-12-31 NOTE — Progress Notes (Signed)
   Nancy Barthel, LCSW

## 2023-12-31 NOTE — Progress Notes (Signed)
 [facility] Counselor/Therapist Progress Note  Patient ID: Nancy [name], MRN: [id number],    Date: 12/31/2023  Time Spent: 8:34am - 9:15am : 41 minutes   Treatment Type: Individual Therapy  Reported Symptoms: Patient reported feeling anxious  Mental Status Exam: Appearance:  Neat and Well Groomed     Behavior: Appropriate  Motor: Normal  Speech/Language:  Clear and Coherent  Affect: Appropriate  Mood: anxious  Thought process: normal  Thought content:   WNL  Sensory/Perceptual disturbances:   WNL  Orientation: oriented to person, place, and situation  Attention: Good  Concentration: Good  Memory: WNL  Fund of knowledge:  Good  Insight:   Good  Judgment:  Good  Impulse Control: Good   Risk Assessment: Danger to Self:  No Patient denied current suicidal ideation  Self-injurious Behavior: No Danger to Others: No Patient denied current homicidal ideation Duty to Warn:no Physical Aggression / Violence:No  Access to Firearms a concern: No  Gang Involvement:No   Subjective: Patient stated, after Christmas things just started snowballing, it seems like for the past year right after Christmas it's just been bad news. Patient stated, this year it wasn't really as bad, it just all happened at once. Patient reported after the holidays a family member passed away.  Patient stated, the storm that's coming has me a little stressed out and I'm overeating. Patient reported she has been preparing for the upcoming winter storm. Patient reported she worries about being homebound for several days or the electricity going out during the upcoming storm. Patient reported she does not like feeling that she is not in control and stated, the idea of being so isolated from everything and everybody in regard to upcoming storm. Patient stated, I know my feelings are irrational. Patient stated, it (eating) relaxes me for a few minutes, when I'm trapped with people and they're  upsetting me I've got a rolodex in my head of what I'm going to eat. Patient identified the following coping strategies: ending the stressful situation, taking a walk, turn on a good TV show, and knitting/crocheting. Patient stated, I'm going to try that strategy in response to visualization/imagery. Patient stated, my mood has been very good I think.  Patient stated, I go over my list and think of all the things I have to be grateful for. Patient stated, I love the journal because it made me stop and think about the worst thing that can happen and what I can do to help.  Patient stated, writing down the reality of it has helped in response to the use of patient's thought record and stated, its working.   Interventions: Cognitive Behavioral Therapy. Clinician conducted session via caregility video from clinician's home office. Patient provided verbal consent to proceed with telehealth session and is aware of limitations of telephone or video visits. Patient participated in session from patient's home. Reviewed events since last session. Assisted patient in exploring and identifying thoughts/feelings triggered by pending winter storm. Discussed coping strategies to decrease feelings of isolation during storm, such as, using video communication. Challenged cognitive distortions and assisted patient in reframing thoughts/situations. Explored and identifying triggers for overeating and discussed coping strategies to utilize in response. Provided psycho education related to visualization/imagery and deep breathing exercises. Explored patient's thoughts associated with the events that occurred after the holiday. Assessed patient's mood. Reviewed patient's thought record. Clinician requested for homework patient continue thought record, identify evidence for/against distortions, and continue gratitude journal.      Collaboration of Care: Other  not required at this time   Diagnosis:  Adjustment  disorder with mixed anxiety and depressed mood     Plan: Patient is to utilize [employer] Therapy, thought re-framing, behavioral activation, and coping strategies to decrease symptoms associated with their diagnosis. Frequency: bi-weekly  Modality: individual      Long-term goal:   Patient identified a long term goal to enroll in college courses/program for creative writing.  Target Date: 06/03/24  Progress: progressing    Short-term goal:  Develop and implement coping strategies to decrease food intake when feeling frustrated, in response to interpersonal conflict, or in response to feelings of isolation   Target Date: 06/03/24  Progress: progressing    Identify and increase patient's participation in activities patient enjoys, such as, pursuing culinary interests, travel, knitting, crocheting, learning to draw Target Date: 06/03/24  Progress: progressing    Develop and implement coping strategies to decrease the intensity and frequency of symptoms related to anxiety and depression Target Date: 06/03/24  Progress: progressing    Develop and utilize effective communication strategies to implement and maintain healthy boundaries with others Target Date: 06/03/24  Progress: progressing     Darice Seats, LCSW

## 2024-01-14 ENCOUNTER — Ambulatory Visit: Payer: Medicare HMO | Admitting: Clinical

## 2024-01-14 DIAGNOSIS — F4323 Adjustment disorder with mixed anxiety and depressed mood: Secondary | ICD-10-CM

## 2024-01-14 NOTE — Progress Notes (Signed)
[facility] Counselor/Therapist Progress Note  Patient ID: Nancy [name], MRN: [id number],    Date: 01/14/2024  Time Spent: 8:34am - 9:21am : 47 minutes   Treatment Type: Individual Therapy  Reported Symptoms: Patient reported recent feelings of anger in response to a trigger  Mental Status Exam: Appearance:  Neat and Well Groomed     Behavior: Appropriate  Motor: Normal  Speech/Language:  Clear and Coherent  Affect: Appropriate  Mood: normal  Thought process: normal  Thought content:   WNL  Sensory/Perceptual disturbances:   WNL  Orientation: oriented to person, place, and situation  Attention: Good  Concentration: Good  Memory: WNL  Fund of knowledge:  Good  Insight:   Good  Judgment:  Good  Impulse Control: Good   Risk Assessment: Danger to Self:  No Patient denied current suicidal ideation  Self-injurious Behavior: No Danger to Others: No Patient denied current homicidal ideation Duty to Warn:no Physical Aggression / Violence:No  Access to Firearms a concern: No  Gang Involvement:No   Subjective: Patient stated, "I wanted to talk to you about my friend" and stated, "couple of things happened and it kind of upset me".  Patient reported she received a voicemail from a friend and stated the friend "was jumping from one thing to the next". Patient reported patient called her friend to ask what her friend needed and stated, "she (friend) went off". Patient reported feeling angry in response. Patient stated, "I was really upset with the way that she treated me and I didn't appreciate it", "I was very offended by the whole thing". Patient stated, "I really do like her and I care about her" in regard to friend. Patient stated, "I'm so stressed, I never know what's going to set her (friend) off" in regard to the relationship with patient's friend.  Patient stated, "the good times are fine but they're far and few between". Patient stated, "It's definitely a one way  friendship". Patient stated, "this has given me a lot to think about". Patient stated, "my mood has been very good". Patient stated, "the visualization didn't work, I just kept going off track". Patient reported she has been baking more and has found it fun. Patient stated, "my mood has changed dramatically".  Interventions: Cognitive Behavioral Therapy and Interpersonal. Clinician conducted session via caregility video from clinician's home office. Patient provided verbal consent to proceed with telehealth session and is aware of limitations of telephone or video visits. Patient participated in session from patient's home.  Discussed patient's recent interactions with patient's friend. Assisted patient in examining the dynamics of patient's relationship with her friend and the impact on patient's mental health. Assisted patient in exploring and identifying thoughts/feelings triggered by friend's behaviors. Provided psycho education related to components of a healthy relationship. Provided psycho education related to establishing and maintaining healthy boundaries. Discussed communication strategies to verbalize patient's boundaries to friend. Assessed patient's mood. Reviewed patient's use of visualization/imagery and the outcome. Clinician requested for homework patient continue thought record, identify evidence for/against distortions, and continue gratitude journal.     Collaboration of Care: Other not required at this time   Diagnosis:  Adjustment disorder with mixed anxiety and depressed mood     Plan: Patient is to utilize [employer] Therapy, thought re-framing, behavioral activation, and coping strategies to decrease symptoms associated with their diagnosis. Frequency: bi-weekly  Modality: individual      Long-term goal:   Patient identified a long term goal to enroll in college courses/program for creative writing.  Target Date: 06/03/24  Progress: progressing    Short-term goal:   Develop and implement coping strategies to decrease food intake when feeling frustrated, in response to interpersonal conflict, or in response to feelings of isolation   Target Date: 06/03/24  Progress: progressing    Identify and increase patient's participation in activities patient enjoys, such as, pursuing culinary interests, travel, knitting, crocheting, learning to draw Target Date: 06/03/24  Progress: progressing    Develop and implement coping strategies to decrease the intensity and frequency of symptoms related to anxiety and depression Target Date: 06/03/24  Progress: progressing    Develop and utilize effective communication strategies to implement and maintain healthy boundaries with others Target Date: 06/03/24  Progress: progressing     Doree Barthel, LCSW

## 2024-01-14 NOTE — Progress Notes (Signed)
Nancy Barthel, LCSW

## 2024-01-16 ENCOUNTER — Other Ambulatory Visit: Payer: Self-pay | Admitting: Nurse Practitioner

## 2024-01-28 ENCOUNTER — Ambulatory Visit (INDEPENDENT_AMBULATORY_CARE_PROVIDER_SITE_OTHER): Payer: Medicare HMO | Admitting: Clinical

## 2024-01-28 DIAGNOSIS — F4323 Adjustment disorder with mixed anxiety and depressed mood: Secondary | ICD-10-CM
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
## 2024-01-28 NOTE — Progress Notes (Signed)
   Doree Barthel, LCSW

## 2024-02-11 ENCOUNTER — Ambulatory Visit: Payer: Medicare HMO | Admitting: Clinical

## 2024-02-11 DIAGNOSIS — F4323 Adjustment disorder with mixed anxiety and depressed mood: Secondary | ICD-10-CM | POA: Diagnosis not present

## 2024-02-11 NOTE — Progress Notes (Signed)
   Doree Barthel, LCSW
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
## 2024-03-06 ENCOUNTER — Ambulatory Visit: Payer: Medicare HMO | Admitting: Clinical

## 2024-03-06 DIAGNOSIS — F4323 Adjustment disorder with mixed anxiety and depressed mood: Secondary | ICD-10-CM
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
## 2024-03-06 NOTE — Progress Notes (Signed)
   Nancy Barthel, LCSW

## 2024-03-19 ENCOUNTER — Encounter: Payer: Self-pay | Admitting: Nurse Practitioner

## 2024-03-20 ENCOUNTER — Ambulatory Visit (INDEPENDENT_AMBULATORY_CARE_PROVIDER_SITE_OTHER): Admitting: Clinical

## 2024-03-20 DIAGNOSIS — F4323 Adjustment disorder with mixed anxiety and depressed mood: Secondary | ICD-10-CM
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
## 2024-03-20 NOTE — Progress Notes (Signed)
   Doree Barthel, LCSW

## 2024-04-12 ENCOUNTER — Ambulatory Visit: Admitting: Clinical

## 2024-04-12 DIAGNOSIS — F4323 Adjustment disorder with mixed anxiety and depressed mood: Secondary | ICD-10-CM | POA: Diagnosis not present

## 2024-04-12 NOTE — Progress Notes (Signed)
 [facility] Counselor/Therapist Progress Note  Patient ID: Nancy [name], MRN: [id number],    Date: 04/12/2024  Time Spent: 8:35am - 9:29am : 54 minutes   Treatment Type: Individual Therapy  Reported Symptoms: fear of contracting an illness  Mental Status Exam: Appearance:  Neat and Well Groomed     Behavior: Appropriate  Motor: Normal  Speech/Language:  Clear and Coherent and Normal Rate  Affect: Appropriate  Mood: normal  Thought process: normal  Thought content:   WNL  Sensory/Perceptual disturbances:   WNL  Orientation: oriented to person, place, and situation  Attention: Good  Concentration: Good  Memory: WNL  Fund of knowledge:  Good  Insight:   Good  Judgment:  Good  Impulse Control: Good   Risk Assessment: Danger to Self:  No no Si/HI Self-injurious Behavior: No Danger to Others: No Duty to Warn:no Physical Aggression / Violence:No  Access to Firearms a concern: No  Gang Involvement:No   Subjective: Patient stated, "they've been going", "just every day life things that are so annoying", "things are going well, just minor complaints" in response to events since last session. Patient reported she was notified last night patient's brother's kidney is failing and functioning at 20%.  Patient reported patient's brother will have to resume dialysis. Patient stated, "Im just trying to feel my way around this and see where this is headed". Patient reported feeling "like I got kicked in the stomach" in response to the news of patient's brother's health. Patient reported she gave her brother a travel clock 30 years ago and her brother recently returned the clock. Patient stated, "I don't like it" in reference to brother returning the clock. Patient reported concern that her brother returned the clock due to brother no longer wanting to fight his illness any longer. Patient reported she hopes her brother will resume dialysis. Patient stated, "basically overall I've  been feeling pretty good" in response to patients mood. Patient reported she continues to maintain a daily gratitude journal. Patient reported she completed a cost benefit analysis to refraining from having lunch with patient's neighbor. Patient stated, "there are more benefits, but it's not worth it to me" in reference to cost/benefit analysis.   Interventions: Cognitive Behavioral Therapy and supportive therapy . Clinician conducted session via caregility video from clinician's office at [facility]. Patient provided verbal consent to proceed with telehealth session and is aware of limitations of telephone or video visits. Patient participated in session from patient's home. Reviewed events since last session and assessed for changes. Provided supportive therapy, active listening, and validation as patient discussed recent change in brother's health and patient's response. Assisted patient in exploring thoughts and feelings triggered by decline in brother's health and clock being returned. Discussed strategies for patient to utilize to support patient's brother. Reviewed patient's gratitude journal entries. Reviewed patient's thought record and cost/benefit analysis. Provided psycho education related to cycle of anxiety. Clinician requested for homework patient continue thought record, identify evidence for/against distortions, continue gratitude journal, and complete cost/benefit analysis.    Collaboration of Care: Other not required at this time   Diagnosis:  Adjustment disorder with mixed anxiety and depressed mood     Plan: Patient is to utilize [employer] Therapy, thought re-framing, behavioral activation, and coping strategies to decrease symptoms associated with their diagnosis. Frequency: bi-weekly  Modality: individual      Long-term goal:   Patient identified a long term goal to enroll in college courses/program for creative writing.  Target Date: 06/03/24  Progress:  progressing    Short-term goal:  Develop and implement coping strategies to decrease food intake when feeling frustrated, in response to interpersonal conflict, or in response to feelings of isolation   Target Date: 06/03/24  Progress: progressing    Identify and increase patient's participation in activities patient enjoys, such as, pursuing culinary interests, travel, knitting, crocheting, learning to draw Target Date: 06/03/24  Progress: progressing    Develop and implement coping strategies to decrease the intensity and frequency of symptoms related to anxiety and depression Target Date: 06/03/24  Progress: progressing    Develop and utilize effective communication strategies to implement and maintain healthy boundaries with others Target Date: 06/03/24  Progress: progressing     Burlene Carpen, LCSW

## 2024-04-12 NOTE — Progress Notes (Signed)
   Doree Barthel, LCSW

## 2024-05-02 ENCOUNTER — Other Ambulatory Visit: Payer: Self-pay | Admitting: Nurse Practitioner

## 2024-05-02 DIAGNOSIS — E039 Hypothyroidism, unspecified: Secondary | ICD-10-CM

## 2024-05-02 DIAGNOSIS — I10 Essential (primary) hypertension: Secondary | ICD-10-CM

## 2024-05-03 ENCOUNTER — Ambulatory Visit: Payer: Medicare HMO

## 2024-05-03 DIAGNOSIS — Z0000 Encounter for general adult medical examination without abnormal findings: Secondary | ICD-10-CM | POA: Diagnosis not present

## 2024-05-03 NOTE — Progress Notes (Signed)
 Subjective:   Nancy [name] is a 73 y.o. who presents for a Medicare Wellness preventive visit.  As a reminder, Annual Wellness Visits don't include a physical exam, and some assessments may be limited, especially if this visit is performed virtually. We may recommend an in-person visit if needed.  Visit Complete: Virtual I connected with  Nancy [name] on 05/03/24 by a video and audio enabled telemedicine application and verified that I am speaking with the correct person using two identifiers.  Patient Location: Home  Provider Location: Office/Clinic  I discussed the limitations of evaluation and management by telemedicine. The patient expressed understanding and agreed to proceed.  Vital Signs: Because this visit was a virtual/telehealth visit, some criteria may be missing or patient reported. Any vitals not documented were not able to be obtained and vitals that have been documented are patient reported.    Persons Participating in Visit: Patient.  AWV Questionnaire: Yes: Patient Medicare AWV questionnaire was completed by the patient on 05/02/2024; I have confirmed that all information answered by patient is correct and no changes since this date.  Cardiac Risk Factors include: advanced age (>25men, >67 women);dyslipidemia;hypertension     Objective:     Today's Vitals   There is no height or weight on file to calculate BMI.     05/03/2024    9:19 AM 02/18/2023   11:41 AM 08/26/2022    8:35 AM 06/10/2022   11:47 AM 06/02/2022    5:12 PM 12/31/2021    3:44 PM 10/20/2021    6:12 AM  Advanced Directives  Does Patient Have a Medical Advance Directive? No No No No No No No  Would patient like information on creating a medical advance directive? No - Patient declined  No - Patient declined No - Patient declined No - Patient declined  No - Patient declined    Current Medications (verified) Outpatient Encounter Medications as of 05/03/2024  Medication Sig   atorvastatin   (LIPITOR) 20 MG tablet TAKE 1 TABLET BY MOUTH EVERY DAY AT BEDTIME FOR CHOLESTROL   azelastine  (OPTIVAR ) 0.05 % ophthalmic solution INSTILL ONE DROP IN EACH EYE TWICE DAILY Strength: 0.05 %   blood glucose meter kit and supplies Dispense based on patient and insurance preference. Use up to four times daily as directed. (FOR ICD-10 E10.9, E11.9).   Blood Glucose Monitoring Suppl (ACCU-CHEK GUIDE) w/Device KIT 1 Device by Does not apply route daily.   Blood Pressure Monitor KIT USE TO CHECK BLOOD PRESSURE 110   Cholecalciferol 125 MCG (5000 UT) TABS Take 5,000 Units by mouth daily.   desoximetasone  (TOPICORT ) 0.25 % cream Apply to skin daily safe to use on face and skin folds   Flaxseed, Linseed, (FLAXSEED OIL) 1000 MG CAPS Take by mouth.   GLUCOSAMINE CHONDROITIN COMPLX PO Take 1 capsule by mouth daily. 1500 mg/1200 mg   glucose blood (ONETOUCH ULTRA) test strip USE TO CHECK BLOOD GLUCOSE ONCE DAILY AS DIRECTED   Lactobacillus (PROBIOTIC ACIDOPHILUS PO) Take 0.5 mg by mouth daily.   Lancets (ONETOUCH DELICA PLUS LANCET30G) MISC USE TO CHECK BLOOD SUGAR ONE DAILY AS DIRECTED   levothyroxine  (SYNTHROID ) 150 MCG tablet TAKE 1 TABLET BY MOUTH BEFORE BREAKFAST DAILY   Magnesium 250 MG TABS Take 250 mg by mouth daily.   Multiple Vitamins-Minerals (MULTIVITAMIN WOMEN 50+) TABS Take 1 tablet by mouth daily.   spironolactone  (ALDACTONE ) 50 MG tablet TAKE 1 TABLET BY MOUTH EVERY DAY   No facility-administered encounter medications on file as of 05/03/2024.  Allergies (verified) Patient has no known allergies.   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   Complication of anesthesia    pt states she woke up during a tonsillectomy when she was 28   Hyperlipidemia    Hypertension    Hypothyroidism 1980   Pre-diabetes    Prediabetes    Thyroid  disease    Past Surgical History:  Procedure Laterality Date    BREAST SURGERY     CHOLECYSTECTOMY     COSMETIC SURGERY  1985   EYE SURGERY  2023   HERNIA REPAIR  2023   LIPOMA EXCISION N/A 06/10/2022   Procedure: EXCISION ABDOMINAL WALL SEROMA;  Surgeon: Thornell Flirt, DO;  Location: [facility];  Service: Plastics;  Laterality: N/A;  45 MINUTES   NASAL SEPTUM SURGERY  1990   PANNICULECTOMY N/A 10/20/2021   Procedure: PANNICULECTOMY;  Surgeon: Thornell Flirt, DO;  Location: [facility] OR;  Service: Plastics;  Laterality: N/A;  3.5 hours   REDUCTION MAMMAPLASTY     TONSILLECTOMY     VENTRAL HERNIA REPAIR N/A 10/20/2021   Procedure: HERNIA REPAIR VENTRAL ADULT WITH MESH;  Surgeon: Oza Blumenthal, MD;  Location[facility];  Service: General;  Laterality: N/A;   Family History  Problem Relation Age of Onset   Diabetes Mother    Hypertension Mother    Hypertension Father    Heart disease Father    Diabetes Sister    Kidney failure Sister    Diabetes Brother    Kidney disease Brother    Diabetes Sister    Diabetes Sister    Asthma Sister    Cancer Maternal Grandfather    Cancer Paternal Grandfather    Social History   Socioeconomic History   Marital status: Unknown    Spouse name: Not on file   Number of children: Not on file   Years of education: Not on file   Highest education level: Master's degree (e.g., MA, MS, MEng, MEd, MSW, MBA)  Occupational History   Occupation: retired  Tobacco Use   Smoking status: Former    Current packs/day: 0.00    Average packs/day: 1 pack/day for 1 year (1.0 ttl pk-yrs)    Types: Cigarettes    Start date: 12/22/2007    Quit date: 12/21/2008    Years since quitting: 15.3   Smokeless tobacco: Never  Vaping Use   Vaping status: Never Used  Substance and Sexual Activity   Alcohol use: Not Currently    Comment: rare   Drug use: Never   Sexual activity: Not Currently    Birth control/protection: Post-menopausal  Other Topics Concern   Not on file  Social History Narrative   Not on  file   Social Drivers of Health   Financial Resource Strain: Low Risk  (05/03/2024)   Overall Financial Resource Strain (CARDIA)    Difficulty of Paying Living Expenses: Not hard at all  Food Insecurity: No Food Insecurity (05/03/2024)   Hunger Vital Sign    Worried About Running Out of Food in the Last Year: Never true    Ran Out of Food in the Last Year: Never true  Transportation Needs: No Transportation Needs (05/03/2024)   PRAPARE - Administrator, Civil Service (Medical): No    Lack of Transportation (Non-Medical): No  Physical Activity: Inactive (05/03/2024)   Exercise Vital Sign    Days  of Exercise per Week: 0 days    Minutes of Exercise per Session: 0 min  Stress: No Stress Concern Present (05/03/2024)   [employer] of Occupational Health - Occupational Stress Questionnaire    Feeling of Stress : Only a little  Social Connections: Socially Isolated (05/03/2024)   Social Connection and Isolation Panel [NHANES]    Frequency of Communication with Friends and Family: More than three times a week    Frequency of Social Gatherings with Friends and Family: Once a week    Attends Religious Services: Never    Database administrator or Organizations: No    Attends Engineer, structural: Never    Marital Status: Divorced    Tobacco Counseling Counseling given: Not Answered    Clinical Intake:  Pre-visit preparation completed: Yes  Pain : No/denies pain     Nutritional Risks: None Diabetes: No  Lab Results  Component Value Date   HGBA1C 5.9 (H) 12/08/2023   HGBA1C 5.8 (H) 06/08/2023   HGBA1C 5.8 (H) 09/24/2022     How often do you need to have someone help you when you read instructions, pamphlets, or other written materials from your doctor or pharmacy?: 1 - Never  Interpreter Needed?: No  Information entered by :: NAllen LPN   Activities of Daily Living     05/02/2024    9:28 AM  In your present state of health, do you have any  difficulty performing the following activities:  Hearing? 0  Vision? 0  Difficulty concentrating or making decisions? 0  Walking or climbing stairs? 0  Dressing or bathing? 0  Doing errands, shopping? 0  Preparing Food and eating ? N  Using the Toilet? N  In the past six months, have you accidently leaked urine? N  Do you have problems with loss of bowel control? N  Managing your Medications? N  Managing your Finances? N  Housekeeping or managing your Housekeeping? N    Patient Care Team: Susanna Epley, FNP as PCP - General (General Practice) Hugh Madura, MD as PCP - Cardiology (Cardiology) Pearson, Vallie J, [facility] (Inactive) (Pharmacist) Sheffield, Raynald Calkins, PA-C as Physician Assistant (Dermatology) [facility]  Indicate any recent Medical Services you may have received from other than [facility] providers in the past year (date may be approximate).     Assessment:    This is a routine wellness examination for Nancy [name].  Hearing/Vision screen Hearing Screening - Comments:: Denies hearing issues Vision Screening - Comments:: Regular eye exams, [facility]   Goals Addressed             This Visit's Progress    Patient Stated       05/03/2024, wants to lower blood sugar, but not increase the fat       Depression Screen     05/03/2024    9:21 AM 06/08/2023    9:35 AM 02/18/2023   11:43 AM 09/24/2022   10:57 AM 12/31/2021    3:45 PM 01/16/2021   11:42 AM 04/10/2020   10:38 AM  PHQ 2/9 Scores  PHQ - 2 Score 0 1 0 0 0 0 0  PHQ- 9 Score 0 7         Fall Risk     05/02/2024    9:28 AM 06/08/2023    9:35 AM 02/18/2023   11:43 AM 09/24/2022   10:56 AM 12/31/2021    3:44 PM  Fall Risk   Falls in the past year?  0 0 0 0 0  Number falls in past yr: 0 0 0 0   Injury with Fall? 0 0 0 0   Risk for fall due to : Medication side effect No Fall Risks Medication side effect No Fall Risks Medication side effect  Follow up Falls evaluation completed;Falls prevention  discussed Falls evaluation completed Falls prevention discussed;Education provided;Falls evaluation completed Falls evaluation completed Falls evaluation completed;Education provided;Falls prevention discussed    MEDICARE RISK AT HOME:  Medicare Risk at Home Any stairs in or around the home?: (Patient-Rptd) No Home free of loose throw rugs in walkways, pet beds, electrical cords, etc?: (Patient-Rptd) No Adequate lighting in your home to reduce risk of falls?: (Patient-Rptd) Yes Life alert?: (Patient-Rptd) No Use of a cane, walker or w/c?: (Patient-Rptd) No Grab bars in the bathroom?: (Patient-Rptd) No Shower chair or bench in shower?: (Patient-Rptd) No Elevated toilet seat or a handicapped toilet?: (Patient-Rptd) No  TIMED UP AND GO:  Was the test performed?  No  Cognitive Function: 6CIT completed        05/03/2024    9:22 AM 02/18/2023   11:45 AM 12/31/2021    3:45 PM 01/16/2021   11:44 AM 01/11/2020   10:36 AM  6CIT Screen  What Year? 0 points 0 points 0 points 0 points 0 points  What month? 0 points 0 points 0 points 0 points 0 points  What time? 0 points 0 points 0 points 0 points 0 points  Count back from 20 0 points 0 points 0 points 0 points 0 points  Months in reverse 0 points 0 points 0 points 0 points 0 points  Repeat phrase 0 points 2 points 0 points 2 points 0 points  Total Score 0 points 2 points 0 points 2 points 0 points    Immunizations Immunization History  Administered Date(s) Administered   Fluad Quad(high Dose 65+) 09/24/2022   Influenza, High Dose Seasonal PF 08/13/2019   Influenza,inj,Quad PF,6+ Mos 08/13/2019   Influenza-Unspecified 09/11/2021, 08/07/2023   Moderna Covid-19 Fall Seasonal Vaccine 75yrs & older 09/11/2023   PFIZER Comirnaty(Gray Top)Covid-19 Tri-Sucrose Vaccine 06/12/2021   PFIZER(Purple Top)SARS-COV-2 Vaccination 02/16/2020, 03/13/2020, 10/03/2020, 06/12/2021, 06/12/2021   PNEUMOCOCCAL CONJUGATE-20 12/26/2021   Pfizer Covid-19 Vaccine  Bivalent Booster 31yrs & up 10/12/2021, 09/25/2022   Tdap 01/11/2020   Zoster Recombinant(Shingrix) 08/29/2020, 11/06/2020    Screening Tests Health Maintenance  Topic Date Due   COVID-19 Vaccine (8 - Pfizer risk 2024-25 season) 03/10/2024   INFLUENZA VACCINE  07/21/2024   Medicare Annual Wellness (AWV)  05/03/2025   MAMMOGRAM  06/10/2025   Colonoscopy  09/01/2028   DTaP/Tdap/Td (2 - Td or Tdap) 01/10/2030   Pneumonia Vaccine 76+ Years old  Completed   DEXA SCAN  Completed   Hepatitis C Screening  Completed   Zoster Vaccines- Shingrix  Completed   HPV VACCINES  Aged Out   Meningococcal B Vaccine  Aged Out    Health Maintenance  Health Maintenance Due  Topic Date Due   COVID-19 Vaccine (8 - Pfizer risk 2024-25 season) 03/10/2024   Health Maintenance Items Addressed: Up to date  Additional Screening:  Vision Screening: Recommended annual ophthalmology exams for early detection of glaucoma and other disorders of the eye.  Dental Screening: Recommended annual dental exams for proper oral hygiene  Community Resource Referral / Chronic Care Management: CRR required this visit?  No   CCM required this visit?  No   Plan:    I have personally reviewed and noted the following  in the patient's chart:   Medical and social history Use of alcohol, tobacco or illicit drugs  Current medications and supplements including opioid prescriptions. Patient is not currently taking opioid prescriptions. Functional ability and status Nutritional status Physical activity Advanced directives List of other physicians Hospitalizations, surgeries, and ER visits in previous 12 months Vitals Screenings to include cognitive, depression, and falls Referrals and appointments  In addition, I have reviewed and discussed with patient certain preventive protocols, quality metrics, and best practice recommendations. A written personalized care plan for preventive services as well as general  preventive health recommendations were provided to patient.   Areatha Beecham, LPN   [id number]/[id number]   After Visit Summary: (MyChart) Due to this being a telephonic visit, the after visit summary with patients personalized plan was offered to patient via MyChart   Notes: Nothing significant to report at this time.

## 2024-05-03 NOTE — Patient Instructions (Signed)
 Nancy [name] , Thank you for taking time out of your busy schedule to complete your Annual Wellness Visit with me. I enjoyed our conversation and look forward to speaking with you again next year. I, as well as your care team,  appreciate your ongoing commitment to your health goals. Please review the following plan we discussed and let me know if I can assist you in the future. Your Game plan/ To Do List    Referrals: If you haven't heard from the office you've been referred to, please reach out to them at the phone provided.  N/a Follow up Visits: Next Medicare AWV with our clinical staff: office will schedule   Have you seen your provider in the last 6 months (3 months if uncontrolled diabetes)? Yes Next Office Visit with your provider: 06/08/2024 at 8:20  Clinician Recommendations:  Aim for 30 minutes of exercise or brisk walking, 6-8 glasses of water, and 5 servings of fruits and vegetables each day.       This is a list of the screening recommended for you and due dates:  Health Maintenance  Topic Date Due   COVID-19 Vaccine (8 - Pfizer risk 2024-25 season) 03/10/2024   Flu Shot  07/21/2024   Medicare Annual Wellness Visit  05/03/2025   Mammogram  06/10/2025   Colon Cancer Screening  09/01/2028   DTaP/Tdap/Td vaccine (2 - Td or Tdap) 01/10/2030   Pneumonia Vaccine  Completed   DEXA scan (bone density measurement)  Completed   Hepatitis C Screening  Completed   Zoster (Shingles) Vaccine  Completed   HPV Vaccine  Aged Out   Meningitis B Vaccine  Aged Out    Advanced directives: (Declined) Advance directive discussed with you today. Even though you declined this today, please call our office should you change your mind, and we can give you the proper paperwork for you to fill out. Advance Care Planning is important because it:  [x]  Makes sure you receive the medical care that is consistent with your values, goals, and preferences  [x]  It provides guidance to your family and loved  ones and reduces their decisional burden about whether or not they are making the right decisions based on your wishes.  Follow the link provided in your after visit summary or read over the paperwork we have mailed to you to help you started getting your Advance Directives in place. If you need assistance in completing these, please reach out to us  so that we can help you!  See attachments for Preventive Care and Fall Prevention Tips.

## 2024-05-17 ENCOUNTER — Ambulatory Visit: Admitting: Clinical

## 2024-05-17 DIAGNOSIS — F4323 Adjustment disorder with mixed anxiety and depressed mood: Secondary | ICD-10-CM

## 2024-05-17 NOTE — Progress Notes (Signed)
   Nancy Barthel, LCSW

## 2024-05-17 NOTE — Progress Notes (Signed)
 [facility] Counselor/Therapist Progress Note  Patient ID: Nancy [name], MRN: [id number],    Date: 05/17/2024  Time Spent: 8:40am - 9:27am : 47 minutes   Treatment Type: Individual Therapy  Reported Symptoms: fear, anxiety  Mental Status Exam: Appearance:  Neat and Well Groomed     Behavior: Appropriate  Motor: Normal  Speech/Language:  Clear and Coherent and Normal Rate  Affect: Appropriate  Mood: normal  Thought process: normal  Thought content:   WNL  Sensory/Perceptual disturbances:   WNL  Orientation: oriented to person, place, time/date, and situation  Attention: Good  Concentration: Good  Memory: WNL  Fund of knowledge:  Good  Insight:   Good  Judgment:  Good  Impulse Control: Good   Risk Assessment: Danger to Self:  No Patient denied current suicidal ideation  Self-injurious Behavior: No Danger to Others: No Patient denied current homicidal ideation Duty to Warn:no Physical Aggression / Violence:No  Access to Firearms a concern: No  Gang Involvement:No   Subjective: Patient stated, "its being going pretty good" in response to events since last session. Patient reported patient's brother's kidney function has declined significantly and patient reported she feels her brother is more receptive to receiving dialysis. Patient stated, "its like a load off" in reference to brother being receptive to dialysis. Patient stated, "pretty good" in response to mood since last session. Patient reported a friend is planning to visit patient in August and friend has requested to go to [place]. Patient reported patient suggested traveling [place] and staying in a hotel. Patient reported she is now uncertain about traveling by bus and staying in a hotel. Patient reported fear of traveling to [place] by car due to limited lighting and isolated areas. Patient reported a fear of traveling by bus due to being in an enclosed space for a long period  of time, being around a large number of people, fear of COVID. Patient reported fear of COVID due to two siblings passing from COVID. Patient reported "I'm finding that to be true" in reference to cycle of anxiety. Patient stated, "I want to be around people but I don't want to be around people". Patient reported she has considered going to [employer].  Patient stated, "I feel like it would be a safe place" in reference to [employer].    Interventions: Cognitive Behavioral Therapy. Clinician conducted session via caregility video from clinician's office at [facility]. Patient provided verbal consent to proceed with telehealth session and is aware of limitations of telephone or video visits. Patient participated in session from patient's home. Reviewed events since last session and assessed for changes. Discussed status of brother's health and patient's thoughts/feelings in response. Explored fear of traveling by car and bus to the beach. Challenged statements/thoughts associated with fear to assist patient in reframing situation. Provided psycho education related to cognitive distortion of over generalization and cycle of anxiety. Explored strategies to [employer], such as, calling to inquire about lower traffic times, attending a larger library with more space, attending during lower traffic times. Clinician requested for homework patient continue thought record, identify/challenge evidence for/against distortions, continue gratitude journal.    Collaboration of Care: Other not required at this time   Diagnosis:  Adjustment disorder with mixed anxiety and depressed mood     Plan: Patient is to utilize [employer], thought re-framing, behavioral activation, and coping strategies to decrease symptoms associated with their diagnosis. Frequency: bi-weekly  Modality: individual  Long-term goal:   Patient identified a long term goal to enroll in college courses/program  for creative writing.  Target Date: 06/03/24  Progress: progressing    Short-term goal:  Develop and implement coping strategies to decrease food intake when feeling frustrated, in response to interpersonal conflict, or in response to feelings of isolation   Target Date: 06/03/24  Progress: progressing    Identify and increase patient's participation in activities patient enjoys, such as, pursuing culinary interests, travel, knitting, crocheting, learning to draw Target Date: 06/03/24  Progress: progressing    Develop and implement coping strategies to decrease the intensity and frequency of symptoms related to anxiety and depression Target Date: 06/03/24  Progress: progressing    Develop and utilize effective communication strategies to implement and maintain healthy boundaries with others Target Date: 06/03/24  Progress: progressing     Burlene Carpen, LCSW

## 2024-06-02 ENCOUNTER — Ambulatory Visit: Admitting: Clinical

## 2024-06-08 ENCOUNTER — Encounter: Payer: Self-pay | Admitting: Nurse Practitioner

## 2024-06-08 ENCOUNTER — Ambulatory Visit: Payer: Medicare HMO | Admitting: Nurse Practitioner

## 2024-06-08 VITALS — Temp 98.5°F | Ht 65.0 in | Wt 248.0 lb

## 2024-06-08 DIAGNOSIS — Z2821 Immunization not carried out because of patient refusal: Secondary | ICD-10-CM | POA: Diagnosis not present

## 2024-06-08 DIAGNOSIS — E782 Mixed hyperlipidemia: Secondary | ICD-10-CM

## 2024-06-08 DIAGNOSIS — R42 Dizziness and giddiness: Secondary | ICD-10-CM

## 2024-06-08 DIAGNOSIS — E039 Hypothyroidism, unspecified: Secondary | ICD-10-CM

## 2024-06-08 DIAGNOSIS — I10 Essential (primary) hypertension: Secondary | ICD-10-CM | POA: Diagnosis not present

## 2024-06-08 DIAGNOSIS — R7303 Prediabetes: Secondary | ICD-10-CM

## 2024-06-08 DIAGNOSIS — Z6841 Body Mass Index (BMI) 40.0 and over, adult: Secondary | ICD-10-CM | POA: Diagnosis not present

## 2024-06-08 NOTE — Progress Notes (Signed)
 LILLETTE Kristeen JINNY Gladis, CMA,acting as a Neurosurgeon for Nancy Ada, FNP.,have documented all relevant documentation on the behalf of Nancy Ada, FNP,as directed by  Nancy Ada, FNP while in the presence of Nancy Ada, FNP.  Subjective:  Patient ID: Nancy [name] , female    DOB: 1951-05-31 , 73 y.o.   MRN: [id number]  Chief Complaint  Patient presents with   Hypertension    Patient presents today for a bp and pre dm follow up, Patient reports compliance with medication. Patient denies any chest pain, SOB, or headaches.    Dizziness    Patient states she has been having muscle cramps and dizziness when she wakes up.     HPI  Patient presents with waking up with dizziness, dry mouth and leg cramps.  The dizziness has been over the last month, when this happens she has had a 1-2 lbs weight drop. 3 episodes over the past month. She is drinking 4 ounce bottles of water per day. No associated with any numbness, headaches.  Feels that headaches are related to sinus congestion that is chronic for her.  No aggravating factors, relieved with rest, getting up slowly.  Patient has lost a lot of weight and is concerned that her medications have remained at the same dosage.    Recent eye exam completed.   Edema may be diet related, occurs after meals.  Has had increased salt intake.    Feels that blood sugars been higher, had been 90-110, highest 112.  Believes this is related to increased carbs.    Hypertension This is a chronic problem. The current episode started more than 1 year ago. The problem has been resolved since onset. The problem is controlled. Associated symptoms include blurred vision, peripheral edema and shortness of breath. Pertinent negatives include no anxiety, chest pain, headaches or palpitations. Risk factors for coronary artery disease include diabetes mellitus, sedentary lifestyle, obesity, dyslipidemia and family history. Hypertensive end-organ damage includes kidney disease.   Diabetes Hypoglycemia symptoms include dizziness. Pertinent negatives for hypoglycemia include no headaches. Associated symptoms include blurred vision. Pertinent negatives for diabetes include no chest pain, no polydipsia, no polyphagia, no polyuria and no weakness.     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   Complication of anesthesia    pt states she woke up during a tonsillectomy when she was 28   Hyperlipidemia    Hypertension    Hypothyroidism 1980   Panniculitis 05/20/2021   Pre-diabetes    Prediabetes    Thyroid  disease      Family History  Problem Relation Age of Onset   Diabetes Mother    Hypertension Mother    Hypertension Father    Heart disease Father    Diabetes Sister    Kidney failure Sister    Diabetes Brother    Kidney disease Brother    Diabetes Sister    Diabetes Sister    Asthma Sister    Cancer Maternal Grandfather    Cancer Paternal Grandfather      Current Outpatient Medications:    atorvastatin  (LIPITOR) 20 MG tablet, TAKE 1 TABLET BY MOUTH EVERY DAY AT BEDTIME FOR CHOLESTROL, Disp: 90 tablet, Rfl: 1   azelastine  (OPTIVAR ) 0.05 % ophthalmic solution, INSTILL ONE DROP IN EACH EYE TWICE DAILY Strength: 0.05 %, Disp: 6 mL, Rfl: 3   blood glucose meter kit and supplies, Dispense based on patient and insurance  preference. Use up to four times daily as directed. (FOR ICD-10 E10.9, E11.9)., Disp: 1 each, Rfl: 3   Blood Glucose Monitoring Suppl (ACCU-CHEK GUIDE) w/Device KIT, 1 Device by Does not apply route daily., Disp: 1 kit, Rfl: 0   Blood Pressure Monitor KIT, USE TO CHECK BLOOD PRESSURE 110, Disp: 1 kit, Rfl: 2   Cholecalciferol 125 MCG (5000 UT) TABS, Take 5,000 Units by mouth daily., Disp: , Rfl:    desoximetasone  (TOPICORT ) 0.25 % cream, Apply to skin daily safe to use on face and skin folds, Disp: 100 g, Rfl: 4   Flaxseed, Linseed, (FLAXSEED OIL) 1000 MG CAPS,  Take by mouth., Disp: , Rfl:    GLUCOSAMINE CHONDROITIN COMPLX PO, Take 1 capsule by mouth daily. 1500 mg/1200 mg, Disp: , Rfl:    glucose blood (ONETOUCH ULTRA) test strip, USE TO CHECK BLOOD GLUCOSE ONCE DAILY AS DIRECTED, Disp: 100 strip, Rfl: 8   Lactobacillus (PROBIOTIC ACIDOPHILUS PO), Take 0.5 mg by mouth daily., Disp: , Rfl:    Lancets (ONETOUCH DELICA PLUS LANCET30G) MISC, USE TO CHECK BLOOD SUGAR ONE DAILY AS DIRECTED, Disp: 100 each, Rfl: 2   levothyroxine  (SYNTHROID ) 150 MCG tablet, TAKE 1 TABLET BY MOUTH BEFORE BREAKFAST DAILY, Disp: 90 tablet, Rfl: 1   Magnesium 250 MG TABS, Take 250 mg by mouth daily., Disp: , Rfl:    Multiple Vitamins-Minerals (MULTIVITAMIN WOMEN 50+) TABS, Take 1 tablet by mouth daily., Disp: , Rfl:    spironolactone  (ALDACTONE ) 50 MG tablet, TAKE 1 TABLET BY MOUTH EVERY DAY, Disp: 90 tablet, Rfl: 1   No Known Allergies   Review of Systems  Constitutional:  Negative for appetite change, chills and fever.  HENT:  Positive for congestion.   Eyes:  Positive for blurred vision.  Respiratory:  Positive for shortness of breath.   Cardiovascular:  Negative for chest pain and palpitations.  Gastrointestinal:  Negative for constipation, diarrhea and nausea.  Endocrine: Negative for polydipsia, polyphagia and polyuria.  Genitourinary:  Negative for dysuria.  Neurological:  Positive for dizziness and light-headedness. Negative for weakness and headaches.     Today's Vitals   06/08/24 0833  Temp: 98.5 F (36.9 C)  TempSrc: Oral  Weight: 248 lb (112.5 kg)  Height: 5' 5 (1.651 m)  PainSc: 2   PainLoc: Hip   Body mass index is 41.27 kg/m.  Wt Readings from Last 3 Encounters:  06/08/24 248 lb (112.5 kg)  12/08/23 254 lb 9.6 oz (115.5 kg)  09/06/23 248 lb (112.5 kg)      Objective:  Physical Exam Vitals reviewed.  Constitutional:      General: She is not in acute distress.    Appearance: Normal appearance.  HENT:     Head: Normocephalic and  atraumatic.  Neck:     Vascular: No carotid bruit.   Cardiovascular:     Rate and Rhythm: Normal rate and regular rhythm.     Pulses: Normal pulses.     Heart sounds: Normal heart sounds. No murmur heard. Pulmonary:     Effort: Pulmonary effort is normal. No respiratory distress.     Breath sounds: Normal breath sounds. No wheezing.  Abdominal:     General: Bowel sounds are normal.   Musculoskeletal:        General: No tenderness. Normal range of motion.     Right lower leg: No edema.     Left lower leg: No edema.   Skin:    General: Skin is warm and dry.  Capillary Refill: Capillary refill takes less than 2 seconds.     Findings: No rash.   Neurological:     General: No focal deficit present.     Mental Status: She is alert and oriented to person, place, and time. Mental status is at baseline.     Cranial Nerves: No cranial nerve deficit.     Motor: No weakness.   Psychiatric:        Mood and Affect: Mood normal.        Behavior: Behavior normal.        Thought Content: Thought content normal.        Judgment: Judgment normal.         Assessment And Plan:  Essential hypertension Assessment & Plan: Blood pressure is controlled. Continue current medications  Orders: -     CMP14+EGFR  Prediabetes Assessment & Plan: Hgba1c is stable, diet controlled.   Orders: -     Hemoglobin A1c  Acquired hypothyroidism Assessment & Plan: Continue current medications and continue f/u with Endocrinology, will check thyroid  levels due to having dizziness.   Orders: -     TSH + free T4  Mixed hyperlipidemia Assessment & Plan: Cholesterol levels are stable, continue statin  Orders: -     Lipid panel -     CMP14+EGFR  Dizziness Assessment & Plan: Orthostats are normal. She is encouraged to change positions slowly. Also to stay well hydrated with water.   Orders: -     TSH + free T4 -     CBC with Differential/Platelet -     CMP14+EGFR  COVID-19 vaccination  declined Assessment & Plan: Declines covid 19 vaccine. Discussed risk of covid 57 and if she changes her mind about the vaccine to call the office. Education has been provided regarding the importance of this vaccine but patient still declined. Advised may receive this vaccine at local pharmacy or Health Dept.or vaccine clinic. Aware to provide a copy of the vaccination record if obtained from local pharmacy or Health Dept.  Encouraged to take multivitamin, vitamin d, vitamin c and zinc to increase immune system. Aware can call office if would like to have vaccine here at office. Verbalized acceptance and understanding.    Obesity, morbid, BMI 40.0-49.9 (HCC) Assessment & Plan: She is encouraged to strive for BMI less than 30 to decrease cardiac risk. Advised to aim for at least 150 minutes of exercise per week.      Return for 6 month phy when able..  Patient was given opportunity to ask questions. Patient verbalized understanding of the plan and was able to repeat key elements of the plan. All questions were answered to their satisfaction.   LILLETTE Nancy Ada, FNP, have reviewed all documentation for this visit. The documentation on 06/08/24 for the exam, diagnosis, procedures, and orders are all accurate and complete.    IF YOU HAVE BEEN REFERRED TO A SPECIALIST, IT MAY TAKE 1-2 WEEKS TO SCHEDULE/PROCESS THE REFERRAL. IF YOU HAVE NOT HEARD FROM US /SPECIALIST IN TWO WEEKS, PLEASE GIVE US  A CALL AT [phone].

## 2024-06-08 NOTE — Patient Instructions (Addendum)
 When you have morning dizziness, check your blood pressure and save the readings to send to the office.    Make sure that you are getting up slowly in the mornings to allow your blood pressure to adjust.  Add an additional bottle of water to your daily intake.    Take 250mg  of Magnesium along with Calcium  to replace electrolytes and help with leg cramps.

## 2024-06-09 LAB — CMP14+EGFR
ALT: 30 IU/L (ref 0–32)
AST: 29 IU/L (ref 0–40)
Albumin: 4.9 g/dL — ABNORMAL HIGH (ref 3.8–4.8)
Alkaline Phosphatase: 71 IU/L (ref 44–121)
BUN/Creatinine Ratio: 24 (ref 12–28)
BUN: 25 mg/dL (ref 8–27)
Bilirubin Total: 1 mg/dL (ref 0.0–1.2)
CO2: 19 mmol/L — ABNORMAL LOW (ref 20–29)
Calcium: 10.1 mg/dL (ref 8.7–10.3)
Chloride: 102 mmol/L (ref 96–106)
Creatinine, Ser: 1.06 mg/dL — ABNORMAL HIGH (ref 0.57–1.00)
Globulin, Total: 2.9 g/dL (ref 1.5–4.5)
Glucose: 90 mg/dL (ref 70–99)
Potassium: 4.5 mmol/L (ref 3.5–5.2)
Sodium: 142 mmol/L (ref 134–144)
Total Protein: 7.8 g/dL (ref 6.0–8.5)
eGFR: 55 mL/min/{1.73_m2} — ABNORMAL LOW (ref >59)

## 2024-06-09 LAB — CBC WITH DIFFERENTIAL/PLATELET
Basophils Absolute: 0.1 10*3/uL (ref 0.0–0.2)
Basos: 1 %
EOS (ABSOLUTE): 0.2 10*3/uL (ref 0.0–0.4)
Eos: 3 %
Hematocrit: 47.3 % — ABNORMAL HIGH (ref 34.0–46.6)
Hemoglobin: 15.5 g/dL (ref 11.1–15.9)
Immature Grans (Abs): 0 10*3/uL (ref 0.0–0.1)
Immature Granulocytes: 0 %
Lymphocytes Absolute: 1.5 10*3/uL (ref 0.7–3.1)
Lymphs: 25 %
MCH: 30.3 pg (ref 26.6–33.0)
MCHC: 32.8 g/dL (ref 31.5–35.7)
MCV: 92 fL (ref 79–97)
Monocytes Absolute: 0.5 10*3/uL (ref 0.1–0.9)
Monocytes: 8 %
Neutrophils Absolute: 3.9 10*3/uL (ref 1.4–7.0)
Neutrophils: 63 %
Platelets: 252 10*3/uL (ref 150–450)
RBC: 5.12 x10E6/uL (ref 3.77–5.28)
RDW: 14.1 % (ref 11.7–15.4)
WBC: 6.1 10*3/uL (ref 3.4–10.8)

## 2024-06-09 LAB — LIPID PANEL
Chol/HDL Ratio: 3 ratio (ref 0.0–4.4)
Cholesterol, Total: 156 mg/dL (ref 100–199)
HDL: 52 mg/dL (ref >39)
LDL Chol Calc (NIH): 87 mg/dL (ref 0–99)
Triglycerides: 93 mg/dL (ref 0–149)
VLDL Cholesterol Cal: 17 mg/dL (ref 5–40)

## 2024-06-09 LAB — TSH+FREE T4
Free T4: 1.93 ng/dL — ABNORMAL HIGH (ref 0.82–1.77)
TSH: 1.09 u[IU]/mL (ref 0.450–4.500)

## 2024-06-09 LAB — HEMOGLOBIN A1C
Est. average glucose Bld gHb Est-mCnc: 123 mg/dL
Hgb A1c MFr Bld: 5.9 % — ABNORMAL HIGH (ref 4.8–5.6)

## 2024-06-18 ENCOUNTER — Ambulatory Visit: Payer: Self-pay | Admitting: Nurse Practitioner

## 2024-06-18 ENCOUNTER — Encounter: Payer: Self-pay | Admitting: Nurse Practitioner

## 2024-06-18 DIAGNOSIS — Z2821 Immunization not carried out because of patient refusal: Secondary | ICD-10-CM | POA: Insufficient documentation

## 2024-06-18 DIAGNOSIS — R42 Dizziness and giddiness: Secondary | ICD-10-CM | POA: Insufficient documentation

## 2024-06-18 NOTE — Assessment & Plan Note (Signed)
 Cholesterol levels are stable, continue statin.

## 2024-06-18 NOTE — Assessment & Plan Note (Signed)
 Orthostats are normal. She is encouraged to change positions slowly. Also to stay well hydrated with water.

## 2024-06-18 NOTE — Assessment & Plan Note (Signed)
Hgba1c is stable, diet controlled.

## 2024-06-18 NOTE — Assessment & Plan Note (Signed)

## 2024-06-18 NOTE — Assessment & Plan Note (Addendum)
 Continue current medications and continue f/u with Endocrinology, will check thyroid  levels due to having dizziness.

## 2024-06-18 NOTE — Assessment & Plan Note (Signed)
 Blood pressure is controlled. Continue current medications.

## 2024-06-18 NOTE — Assessment & Plan Note (Signed)
 She is encouraged to strive for BMI less than 30 to decrease cardiac risk. Advised to aim for at least 150 minutes of exercise per week.

## 2024-06-19 NOTE — Progress Notes (Signed)
 updated

## 2024-07-14 ENCOUNTER — Other Ambulatory Visit: Payer: Self-pay | Admitting: Nurse Practitioner

## 2024-07-14 DIAGNOSIS — Z1231 Encounter for screening mammogram for malignant neoplasm of breast: Secondary | ICD-10-CM

## 2024-07-17 ENCOUNTER — Ambulatory Visit: Admitting: Clinical

## 2024-07-17 DIAGNOSIS — F4323 Adjustment disorder with mixed anxiety and depressed mood: Secondary | ICD-10-CM

## 2024-07-17 NOTE — Progress Notes (Signed)
 [facility] Counselor/Therapist Progress Note  Patient ID: Nancy [name], MRN: [id number],    Date: 07/17/2024  Time Spent: 8:34am - 9:24am : 50 minutes   Treatment Type: Individual Therapy  Reported Symptoms: Patient reported intrusive thoughts and recent feelings of anger and anxiety  Mental Status Exam: Appearance:  Neat and Well Groomed     Behavior: Appropriate  Motor: Normal  Speech/Language:  Clear and Coherent and Normal Rate  Affect: Appropriate  Mood: normal  Thought process: normal  Thought content:   WNL  Sensory/Perceptual disturbances:   WNL  Orientation: oriented to person, place, time/date, and situation  Attention: Good  Concentration: Good  Memory: WNL  Fund of knowledge:  Good  Insight:   Fair  Judgment:  Good  Impulse Control: Good   Risk Assessment: Danger to Self:  No Patient denied current suicidal ideation  Self-injurious Behavior: No Danger to Others: No Patient denied current homicidal ideation Duty to Warn:no Physical Aggression / Violence:No  Access to Firearms a concern: No  Gang Involvement:No   Subjective: Patient stated, they've been going ok, I've just been questioning everything in response to events since last session. Patient stated, I quit writing everything because my feelings are still the same, no change, I didn't see any point in writing it down anymore.  Patient reported patient recently spoke with PCP regarding COVID risks and PCP recommended patient perform activities in public settings. Patient reported patient recently attended [employer] and stated, I stayed to myself. Patient reported patient has been taking faith based classes via phone. Patient stated, I don't know those people I don't know what they might have in response to barriers to socializing with others. Patient stated, Im just leery, I'm all vaxxed up and everything, I just didn't want to be around people, I just don't want people to get  close in response to barriers to socialization. Patient stated, I don't think I want to do it, I don't know if it would work work for me in response to EMDR. Patient stated, there might be a break through, if its successful maybe it will help me to move on in response to pros to EMDR. Patient stated, it will be a waste of time in response to cons to EMDR.  Patient reported patient has experienced a little bit of anger and feels the anger is associated with grief. Patient stated, I feel kind of out of place, there hasn't been a whole lot of joy. Patient reported feeling topics of recent faith based studies, such as, mortality have triggered anxiety. Patient stated, the whole thing has countered my own beliefs in reference to classes. Patient stated,  I feel like I can kind of exhale in response to today's session. Patient reported current goals are still applicable and requested to continue to work towards current goals. Patient stated, a lot better than I did yesterday in response to current mood. Patient reported experiencing anxiety this morning and stated,  now I feel like its reduced by 75%.   Interventions: Cognitive Behavioral Therapy. Clinician conducted session via caregility video from clinician's home office. Patient provided verbal consent to proceed with telehealth session and is aware of limitations of telephone or video visits. Patient participated in session from patient's home. Reviewed events since last session and assessed for changes. Discussed patient's absence from therapy. Reviewed patient's homework and barriers to completing homework assignments.  Discussed patient's conversation with PCP. Explored barriers to socialization with others. Discussed recommendation for EMDR and patients' thoughts/feelings  in response. Challenged statements/thoughts to assist patient in reframing thoughts. Assisted patient in identifying pros/cons for EMDR. Provided psycho education  related to triggers for anxiety. Assisted patient in exploring and identifying triggers for recent feelings of anxiety. Reviewed patient's goals and patient's progress.    Collaboration of Care: Other not required at this time   Diagnosis:  Adjustment disorder with mixed anxiety and depressed mood     Plan: Patient is to utilize [employer] [facility], thought re-framing, behavioral activation, and coping strategies to decrease symptoms associated with their diagnosis. Frequency: bi-weekly  Modality: individual      Long-term goal:   Patient identified a long term goal to enroll in college courses/program for creative writing.  Target Date: 06/03/25  Progress: progressing    Short-term goal:  Develop and implement coping strategies to decrease food intake when feeling frustrated, in response to interpersonal conflict, or in response to feelings of isolation   Target Date: 06/03/25  Progress: progressing    Identify and increase patient's participation in activities patient enjoys, such as, pursuing culinary interests, travel, knitting, crocheting, learning to draw Target Date: 06/03/25  Progress: progressing    Develop and implement coping strategies to decrease the intensity and frequency of symptoms related to anxiety and depression Target Date: 06/03/25  Progress: progressing    Develop and utilize effective communication strategies to implement and maintain healthy boundaries with others Target Date: 06/03/25  Progress: progressing    Darice Seats, LCSW

## 2024-07-17 NOTE — Progress Notes (Signed)
   Doree Barthel, LCSW

## 2024-07-28 ENCOUNTER — Ambulatory Visit: Admitting: Clinical

## 2024-07-28 DIAGNOSIS — F4323 Adjustment disorder with mixed anxiety and depressed mood: Secondary | ICD-10-CM
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
## 2024-07-28 NOTE — Progress Notes (Signed)
   Nancy Barthel, LCSW

## 2024-08-07 DIAGNOSIS — H1045 Other chronic allergic conjunctivitis: Secondary | ICD-10-CM | POA: Diagnosis not present

## 2024-08-07 DIAGNOSIS — H0288B Meibomian gland dysfunction left eye, upper and lower eyelids: Secondary | ICD-10-CM | POA: Diagnosis not present

## 2024-08-07 DIAGNOSIS — Z961 Presence of intraocular lens: Secondary | ICD-10-CM | POA: Diagnosis not present

## 2024-08-07 DIAGNOSIS — H35033 Hypertensive retinopathy, bilateral: Secondary | ICD-10-CM | POA: Diagnosis not present

## 2024-08-07 DIAGNOSIS — H0288A Meibomian gland dysfunction right eye, upper and lower eyelids: Secondary | ICD-10-CM | POA: Diagnosis not present

## 2024-08-07 LAB — HM DIABETES EYE EXAM

## 2024-08-09 ENCOUNTER — Ambulatory Visit
Admission: RE | Admit: 2024-08-09 | Discharge: 2024-08-09 | Disposition: A | Discharge status: Home / Self Care | Source: Ambulatory Visit | Attending: Nurse Practitioner | Admitting: Nurse Practitioner

## 2024-08-09 DIAGNOSIS — Z1231 Encounter for screening mammogram for malignant neoplasm of breast: Secondary | ICD-10-CM

## 2024-08-18 ENCOUNTER — Ambulatory Visit: Admitting: Clinical

## 2024-08-18 DIAGNOSIS — F4323 Adjustment disorder with mixed anxiety and depressed mood: Secondary | ICD-10-CM

## 2024-08-18 NOTE — Progress Notes (Signed)
   Darice Seats, LCSW

## 2024-08-18 NOTE — Progress Notes (Signed)
 [facility] Counselor/Therapist Progress Note  Patient ID: Nancy [name], MRN: [id number],    Date: 08/18/2024  Time Spent: 9:34am - 10:20am : 46 minutes   Treatment Type: Individual Therapy  Reported Symptoms: Patient reported a recent decrease in anxiety.   Mental Status Exam: Appearance:  Neat and Well Groomed     Behavior: Appropriate  Motor: Normal  Speech/Language:  Clear and Coherent and Normal Rate  Affect: Appropriate  Mood: normal  Thought process: normal  Thought content:   WNL  Sensory/Perceptual disturbances:   WNL  Orientation: oriented to person, place, time/date, and situation  Attention: Good  Concentration: Good  Memory: WNL  Fund of knowledge:  Good  Insight:   Good  Judgment:  Good  Impulse Control: Good   Risk Assessment: Danger to Self:  No Patient denied current suicidal ideation  Self-injurious Behavior: No Danger to Others: No Patient denied current homicidal ideation Duty to Warn:no Physical Aggression / Violence:No  Access to Firearms a concern: No  Gang Involvement:No   Subjective: Patient stated, its been going really well and stated, I ventured out in response to events since last session. Patient reported patient went to a local bookstore, had lunch with brother and patient stated, I had a really great time, it was a really comfortable atmosphere.  Patient reported patient accompanied neighbor for a mammogram and went to [employer] to eat after mammogram. Patient stated,  while I was sitting there I was very comfortable in reference to restaurant. Patient stated,  I think it's just my attitude in response to recent changes in patient's activities. Patient stated, I looked at the world and so many things I don't have control over but I have control over me. Patient stated, Its been such a burden since COVID dealing with everything. Patient stated, I'm just fed up with the attitudes that people have and during  session patient acknowledge patient can not control others' attitudes. Patient stated, all I can control is me. Patient stated, I'm tired of it (anxiety) having power over me, I'm tired of it (anxiety) influencing every decision I make. Patient stated, I'm feeling a lot of anger too. 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  Patient stated, It feels really good too in reference to change in anxiety and stated, its liberating. Patient stated, I'm tired of being afraid, what kind of quality of life is that. Patient reported no symptoms of anxiety during recent activities. Patient reported  almost a euphoria in response to impact of activities on patient's mood. Patient stated, It was so liberating, so freeing, It was really happiness in reference to decrease in anxiety and activities. Patient stated,  I just want my power back, I just want my life back in reference to anxiety.   Interventions: Cognitive Behavioral Therapy. Clinician conducted session via caregility video from clinician's home office. Patient provided verbal consent to proceed with telehealth session and is aware of limitations of telephone or video visits. Patient participated in session from patient's home. Reviewed events since last session and assessed for changes. reviewed patient's recent experiences in public settings and patient's response. Explored contributing factors to recent change in anxiety, behaviors, thoughts, and feelings in public settings. Discussed feelings of anger and explored/identified triggers for anger. Processed patient's response to decrease in anxiety in public settings. Explored impact of anxiety/isolation on patient's mood, socialization, opportunities. Provided psycho education related to CBT, connection between thoughts, feelings,  emotions, and triggers. Reviewed thought record. Clinician  requested for homework patient complete thought record.   Collaboration of Care: Other not required at this time   Diagnosis:  Adjustment disorder with mixed anxiety and depressed mood     Plan: Patient is to utilize [employer] Therapy, thought re-framing, behavioral activation, and coping strategies to decrease symptoms associated with their diagnosis. Frequency: bi-weekly  Modality: individual      Long-term goal:   Patient identified a long term goal to enroll in college courses/program for creative writing.  Target Date: 06/03/25  Progress: progressing    Short-term goal:  Develop and implement coping strategies to decrease food intake when feeling frustrated, in response to interpersonal conflict, or in response to feelings of isolation   Target Date: 06/03/25  Progress: progressing    Identify and increase patient's participation in activities patient enjoys, such as, pursuing culinary interests, travel, knitting, crocheting, learning to draw Target Date: 06/03/25  Progress: progressing    Develop and implement coping strategies to decrease the intensity and frequency of symptoms related to anxiety and depression Target Date: 06/03/25  Progress: progressing    Develop and utilize effective communication strategies to implement and maintain healthy boundaries with others Target Date: 06/03/25  Progress: progressing    Darice Seats, LCSW

## 2024-08-28 ENCOUNTER — Ambulatory Visit (INDEPENDENT_AMBULATORY_CARE_PROVIDER_SITE_OTHER)

## 2024-08-28 VITALS — BP 118/80 | HR 60 | Temp 98.4°F | Ht 65.0 in | Wt 248.0 lb

## 2024-08-28 DIAGNOSIS — Z23 Encounter for immunization: Secondary | ICD-10-CM | POA: Diagnosis not present

## 2024-08-28 NOTE — Progress Notes (Signed)
 Patient presents today for a covid vaccine. She received the pfizer covid vaccine in the left deltoid. Patient tolerated injection well. YL,RMA

## 2024-09-01 ENCOUNTER — Ambulatory Visit: Payer: Self-pay

## 2024-09-11 ENCOUNTER — Ambulatory Visit: Admitting: Clinical

## 2024-09-11 DIAGNOSIS — F4323 Adjustment disorder with mixed anxiety and depressed mood: Secondary | ICD-10-CM

## 2024-09-11 NOTE — Progress Notes (Signed)
 [facility] Counselor/Therapist Progress Note  Patient ID: Nancy [name], MRN: [id number],    Date: 09/11/2024  Time Spent: 8:36am - 9:24am : 48 minutes  Treatment Type: Individual Therapy  Reported Symptoms: anxiety  Mental Status Exam: Appearance:  Neat and Well Groomed     Behavior: Appropriate  Motor: Normal  Speech/Language:  Clear and Coherent and Normal Rate  Affect: Appropriate  Mood: normal  Thought process: normal  Thought content:   WNL  Sensory/Perceptual disturbances:   WNL  Orientation: oriented to person, place, time/date, and situation  Attention: Good  Concentration: Good  Memory: WNL  Fund of knowledge:  Good  Insight:   Good  Judgment:  Good  Impulse Control: Good   Risk Assessment: Danger to Self:  No Patient denied current suicidal ideation  Self-injurious Behavior: No Danger to Others: No Patient denied current homicidal ideation Duty to Warn:no Physical Aggression / Violence:No  Access to Firearms a concern: No  Gang Involvement:No   Subjective: Patient stated, as far as dealing with COVID and being out in public, things have been going pretty good. Patient reported patient's nieces contracted COVID and patient stated, they didn't want to tell me because they were afraid I'd worry. Patient stated, I had some little incidences going to [employer] where somebody sneezed and patient reported walking past the individual. Patient stated, the worst that happened was my friend had another brain bleed and reported friend was placed on hospice. Patient stated, my friend is still fighting for her life and I'm hoping she'll rally. Patient reported patient was informed another friend was placed on hospice due to dementia diagnosis. Patient stated, Its been kind of a tough week. Patient stated, the uncertainty of it. Patient reported patient's friend's granddaughter was in patient's home recently and had a cold. Patient reported feeling angry  that friend would allow granddaughter into patient's home while experiencing respiratory symptoms. Patient reported feeling just mad at the world. Patient stated, I don't have the same fear with COVID, I will go out. Patient stated, its all the peripheral stuff has me on edge. Patient stated, I don't have the same I'm not going out there kind of feeling, that has improved dramatically in reference to anxiety in public settings. Patient stated, encouraged me to look at the facts of the matter and not catastrophizing and stated, that's been very very helpful for me in reference to challenging negative thoughts. During session patient shared frustrations as it relates to relationship with patient's friend and friend's behaviors. Patient stated, I feel so much better in response to supportive therapy. Patient stated, I realize there are some things I can't control, for the other things I can control I can set those boundaries.   Interventions: Cognitive Behavioral Therapy. Clinician conducted session via caregility video from clinician's home office. Patient provided verbal consent to proceed with telehealth session and is aware of limitations of telephone or video visits. Patient participated in session from patient's home. Reviewed events since last session and assessed for changes. Provided supportive therapy, active listening, and validation as patient discussed status of friends' health, relationship with friend, and patient's response. Discussed thoughts and feelings triggered by friend's granddaughter being in patient's home with respiratory symptoms. Assessed intensity/frequency of anxiety in public settings. Challenged statements to assist patient in reframing thoughts/situations. Reviewed patient's implementation of challenging negative thought patterns/cognitive restructuring and the outcome. Reviewed boundaries. Clinician requested for homework patient complete thought record.     Collaboration of Care: Other not required at  this time   Diagnosis:  Adjustment disorder with mixed anxiety and depressed mood     Plan: Patient is to utilize [employer] Therapy, thought re-framing, behavioral activation, and coping strategies to decrease symptoms associated with their diagnosis. Frequency: bi-weekly  Modality: individual      Long-term goal:   Patient identified a long term goal to enroll in college courses/program for creative writing.  Target Date: 06/03/25  Progress: progressing    Short-term goal:  Develop and implement coping strategies to decrease food intake when feeling frustrated, in response to interpersonal conflict, or in response to feelings of isolation   Target Date: 06/03/25  Progress: progressing    Identify and increase patient's participation in activities patient enjoys, such as, pursuing culinary interests, travel, knitting, crocheting, learning to draw Target Date: 06/03/25  Progress: progressing    Develop and implement coping strategies to decrease the intensity and frequency of symptoms related to anxiety and depression Target Date: 06/03/25  Progress: progressing    Develop and utilize effective communication strategies to implement and maintain healthy boundaries with others Target Date: 06/03/25  Progress: progressing     Darice Seats, LCSW

## 2024-09-11 NOTE — Progress Notes (Signed)
   Darice Seats, LCSW

## 2024-09-29 ENCOUNTER — Ambulatory Visit: Admitting: Clinical

## 2024-09-29 DIAGNOSIS — F4323 Adjustment disorder with mixed anxiety and depressed mood: Secondary | ICD-10-CM

## 2024-09-29 NOTE — Progress Notes (Signed)
 [facility] Counselor/Therapist Progress Note  Patient ID: Nancy [name], MRN: [id number],    Date: 09/29/2024  Time Spent: 8:34am - 9:25am : 51 minutes   Treatment Type: Individual Therapy  Reported Symptoms: sadness  Mental Status Exam: Appearance:  Neat and Well Groomed     Behavior: Appropriate  Motor: Normal  Speech/Language:  Clear and Coherent and Normal Rate  Affect: Tearful  Mood: sad  Thought process: normal  Thought content:   WNL  Sensory/Perceptual disturbances:   WNL  Orientation: oriented to person, place, time/date, and situation  Attention: Good  Concentration: Good  Memory: WNL  Fund of knowledge:  Good  Insight:   Good  Judgment:  Good  Impulse Control: Good   Risk Assessment: Danger to Self:  No Patient denied current suicidal ideation  Self-injurious Behavior: No Danger to Others: No Patient denied current homicidal ideation Duty to Warn:no Physical Aggression / Violence:No  Access to Firearms a concern: No  Gang Involvement:No   Subjective: Patient stated, everything is so upset down right now.  Patient stated, my friend passed away 04-Oct-2025that was kind of hard. Patient stated, she (friend) never told me she had Afib. Patient reported patient's friend's services are tomorrow.  Patient stated, I want to spend some time alone tomorrow to remember her. Patient stated, Its hard to absorb other people's grief. Patient reported patient/friend always arrived to work early and visited prior to the start of the work day. During session, patient shared positive memories related to patient's friend. Patient reported patient's friend frequently made patient laugh while patient/friend worked together. 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 Patient stated, I'm really kind of angry, if I had known she had Afib I  would have checked up on her more often. Patient stated, I'm particularly upset right now at everything that's happening in the world. Patient reported patient continues to meet with a local faith organization and reported feeling uncomfortable with the organization's beliefs regarding death. Patient stated, I think that will help in response to generating alternatives. Patient reported concern about nephew's behaviors and reported concern for nephew and his family is a current stressor.   Interventions: Cognitive Behavioral Therapy and supportive therapy. Clinician conducted session via caregility video from clinician's home office. Patient provided verbal consent to proceed with telehealth session and is aware of limitations of telephone or video visits. Patient participated in session from patient's home. Reviewed events since last session and assessed for changes. Clarified statement made by patient at the beginning of today's session. Provided supportive therapy, active listening, and validation as patient discussed friend's passing. Processed patient's thoughts/feelings in response to loss of patient's friend. Provided active listening and validation as patient shared positive memories of friend. Assisted patient in generating alternatives. Discussed current stressor. Provided psycho education related to grief, mindfulness exercise of leaves on a stream, and the use of imagery. Clinician requested for homework patient complete thought record.     Collaboration of Care: Other not required at this time   Diagnosis:  Adjustment disorder with mixed anxiety and depressed mood     Plan: Patient is to utilize [employer] Therapy, thought re-framing, behavioral activation, and coping strategies to decrease symptoms associated with their diagnosis. Frequency: bi-weekly  Modality: individual      Long-term goal:   Patient identified a long term goal to enroll in college courses/program for  creative  writing.  Target Date: 06/03/25  Progress: progressing    Short-term goal:  Develop and implement coping strategies to decrease food intake when feeling frustrated, in response to interpersonal conflict, or in response to feelings of isolation   Target Date: 06/03/25  Progress: progressing    Identify and increase patient's participation in activities patient enjoys, such as, pursuing culinary interests, travel, knitting, crocheting, learning to draw Target Date: 06/03/25  Progress: progressing    Develop and implement coping strategies to decrease the intensity and frequency of symptoms related to anxiety and depression Target Date: 06/03/25  Progress: progressing    Develop and utilize effective communication strategies to implement and maintain healthy boundaries with others Target Date: 06/03/25  Progress: progressing     Darice Seats, LCSW

## 2024-09-29 NOTE — Progress Notes (Signed)
   Darice Seats, LCSW

## 2024-10-20 ENCOUNTER — Other Ambulatory Visit: Payer: Self-pay | Admitting: Nurse Practitioner

## 2024-10-20 ENCOUNTER — Ambulatory Visit: Admitting: Clinical

## 2024-10-20 DIAGNOSIS — F4323 Adjustment disorder with mixed anxiety and depressed mood: Secondary | ICD-10-CM

## 2024-10-20 NOTE — Progress Notes (Signed)
   Darice Seats, LCSW

## 2024-10-20 NOTE — Progress Notes (Signed)
 [facility] Counselor/Therapist Progress Note  Patient ID: Nancy [name], MRN: [id number],    Date: 10/20/2024  Time Spent: 9:36am - 10:24am : 48 minutes   Treatment Type: Individual Therapy  Reported Symptoms: none reported  Mental Status Exam: Appearance:  Neat and Well Groomed     Behavior: Appropriate  Motor: Normal  Speech/Language:  Clear and Coherent and Normal Rate  Affect: Appropriate  Mood: normal  Thought process: normal  Thought content:   WNL  Sensory/Perceptual disturbances:   WNL  Orientation: oriented to person, place, time/date, and situation  Attention: Good  Concentration: Good  Memory: WNL  Fund of knowledge:  Good  Insight:   Good  Judgment:  Good  Impulse Control: Good   Risk Assessment: Danger to Self:  No Patient denied current suicidal ideation  Self-injurious Behavior: No Danger to Others: No Patient denied current homicidal ideation Duty to Warn:no Physical Aggression / Violence:No  Access to Firearms a concern: No  Gang Involvement:No   Subjective: Patient stated, they've been going really well in response to events since last session.  Patient reported patient's nephew has been attending substance abuse treatment and there has been improvement in family dynamics. Patient stated, so far things look really really good in reference to family dynamics and patient's previous concerns. 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 Patient reported several family members are experiencing financial hardships and patient has provided financial support. Patient reported patient considered flying to [place]  and stated, I was prepared to go to support family members. Patient stated, when I said them I didn't even think about COVID in reference to making plans with family members. Patient inquired about strategies to support nephew if nephew experienced a relapse. Patient stated, much better in  response to mood.   Interventions: Cognitive Behavioral Therapy. Clinician conducted session via caregility video from clinician's home office. Patient provided verbal consent to proceed with telehealth session and is aware of limitations of telephone or video visits. Patient participated in session from patient's home. Reviewed events since last session and assessed for changes. Discussed positive events/changes since last session. Reviewed patient's implementation of imagery and the outcome. Discussed financial resources. Reviewed strategies to support nephew and resources for family members supporting an individual with an alcohol addiction, such as, [employer]. Clinician requested for homework patient complete thought record and practice mindfulness exercises and imagery.    Collaboration of Care: Other not required at this time   Diagnosis:  Adjustment disorder with mixed anxiety and depressed mood     Plan: Patient is to utilize [employer] Therapy, thought re-framing, behavioral activation, and coping strategies to decrease symptoms associated with their diagnosis. Frequency: bi-weekly  Modality: individual      Long-term goal:   Patient identified a long term goal to enroll in college courses/program for creative writing.  Target Date: 06/03/25  Progress: progressing    Short-term goal:  Develop and implement coping strategies to decrease food intake when feeling frustrated, in response to interpersonal conflict, or in response to feelings of isolation   Target Date: 06/03/25  Progress: progressing    Identify and increase patient's participation in activities patient enjoys, such as, pursuing culinary interests, travel, knitting, crocheting, learning to draw Target Date: 06/03/25  Progress: progressing    Develop and implement coping strategies to decrease the intensity and frequency of symptoms related to anxiety and depression Target Date: 06/03/25  Progress: progressing     Develop and utilize effective communication strategies  to implement and maintain healthy boundaries with others Target Date: 06/03/25  Progress: progressing       Darice Seats, LCSW

## 2024-10-24 ENCOUNTER — Other Ambulatory Visit: Payer: Self-pay | Admitting: Nurse Practitioner

## 2024-10-24 DIAGNOSIS — I10 Essential (primary) hypertension: Secondary | ICD-10-CM

## 2024-10-24 DIAGNOSIS — E039 Hypothyroidism, unspecified: Secondary | ICD-10-CM

## 2024-11-03 ENCOUNTER — Other Ambulatory Visit: Payer: Self-pay | Admitting: Nurse Practitioner

## 2024-11-06 ENCOUNTER — Ambulatory Visit: Admitting: Clinical

## 2024-11-06 DIAGNOSIS — F4323 Adjustment disorder with mixed anxiety and depressed mood: Secondary | ICD-10-CM

## 2024-11-06 NOTE — Progress Notes (Signed)
   Nancy Seats, LCSW

## 2024-11-06 NOTE — Progress Notes (Signed)
 [facility] Counselor/Therapist Progress Note  Patient ID: Nancy [name], MRN: [id number],    Date: 11/06/2024  Time Spent: 9:36am - 10:29am : 53 minutes   Treatment Type: Individual Therapy  Reported Symptoms: worry  Mental Status Exam: Appearance:  Neat and Well Groomed     Behavior: Appropriate  Motor: Normal  Speech/Language:  Clear and Coherent and Normal Rate  Affect: Appropriate  Mood: normal  Thought process: normal  Thought content:   WNL  Sensory/Perceptual disturbances:   WNL  Orientation: oriented to person, place, time/date, and situation  Attention: Good  Concentration: Good  Memory: WNL  Fund of knowledge:  Good  Insight:   Good  Judgment:  Good  Impulse Control: Good   Risk Assessment: Danger to Self:  No Patient denied current suicidal ideation  Self-injurious Behavior: No Danger to Others: No Patient denied current homicidal ideation Duty to Warn:no Physical Aggression / Violence:No  Access to Firearms a concern: No  Gang Involvement:No   Subjective: Patient stated, things have been going extremely well in response to events since last session. Patient reported neighbor had surgery this weekend and patient spent time with friend during friend's recovery. Patient reported patient recently attended a class with a friend to learn to make a blanket. Patient reported the instructor started coughing during the class and patient reported wanting to ask if instructor had COVID. Patient reported patient remained in class. Patient stated, I thought well I'm all vaxed up and maybe ill just try and stay. Patient stated, the thought kept running across in reference to thought to leave the class. Patient reported making the blanket distracted patient from thoughts of leaving the class. Patient stated, I didn't have time to really dwell on whether to stay or not, there were a lot more benefits to staying. Patient stated, I was glad I stayed.  Patient reported practicing decatastrophizing/challenging negative thoughts in response to thoughts experiencing during the class. Patient reported patient is considering returning for another class. Patient stated, It was fun and I'm looking forward to doing something again. Patient stated, the tools have helped me to calm down a lot, so many of the tools just thinking through this, not responding to the fear and thinking through it logically, it has helped tremendously.  Patient reported patience has been practicing mindfulness exercise of leaves on a stream and has considered other situations to practice leaves on a stream. Patient stated, its really good in response to current mood.   Interventions: Cognitive Behavioral Therapy. Clinician conducted session via caregility video for the entirety of session and audio via telephone from 9:52am - 10:29am from clinician's home office due to audio component of patient's caregility not working. Patient provided verbal consent to proceed with telehealth session and is aware of limitations of telephone or video visits. Patient participated in session from patient's home. Reviewed events since last session and assessed for changes. Reviewed recent participation in positive activities and opportunities for socialization. Discussed recent triggers for anxiety/worry and thoughts triggered by the situation. Explored patient's response to the situation and reviewed patient's implementation of challenging negative thought patterns/decatastrophizing and leaves on a stream. Explored and identified benefits to remaining in the class. Praised patient's progress and implementation of challenging negative thought patterns. Provided psycho education related to mindfulness exercises, such as, mindful eating, use of 5 senses, and grounding exercises. Clinician requested for homework patient continue thought record and practice mindfulness exercises and imagery.     Collaboration of Care: Other not required at  this time   Diagnosis:  Adjustment disorder with mixed anxiety and depressed mood     Plan: Patient is to utilize [employer] Therapy, thought re-framing, behavioral activation, and coping strategies to decrease symptoms associated with their diagnosis. Frequency: bi-weekly  Modality: individual      Long-term goal:   Patient identified a long term goal to enroll in college courses/program for creative writing.  Target Date: 06/03/25  Progress: progressing    Short-term goal:  Develop and implement coping strategies to decrease food intake when feeling frustrated, in response to interpersonal conflict, or in response to feelings of isolation   Target Date: 06/03/25  Progress: progressing    Identify and increase patient's participation in activities patient enjoys, such as, pursuing culinary interests, travel, knitting, crocheting, learning to draw Target Date: 06/03/25  Progress: progressing    Develop and implement coping strategies to decrease the intensity and frequency of symptoms related to anxiety and depression Target Date: 06/03/25  Progress: progressing    Develop and utilize effective communication strategies to implement and maintain healthy boundaries with others Target Date: 06/03/25  Progress: progressing       Darice Seats, LCSW

## 2024-12-04 ENCOUNTER — Ambulatory Visit: Admitting: Clinical

## 2024-12-18 ENCOUNTER — Encounter: Payer: Self-pay | Admitting: Nurse Practitioner

## 2024-12-18 ENCOUNTER — Ambulatory Visit: Payer: Self-pay | Admitting: Nurse Practitioner

## 2024-12-18 VITALS — BP 140/80 | HR 99 | Temp 98.5°F | Ht 65.0 in | Wt 249.8 lb

## 2024-12-18 DIAGNOSIS — E782 Mixed hyperlipidemia: Secondary | ICD-10-CM

## 2024-12-18 DIAGNOSIS — R42 Dizziness and giddiness: Secondary | ICD-10-CM | POA: Diagnosis not present

## 2024-12-18 DIAGNOSIS — Z79899 Other long term (current) drug therapy: Secondary | ICD-10-CM

## 2024-12-18 DIAGNOSIS — R7303 Prediabetes: Secondary | ICD-10-CM

## 2024-12-18 DIAGNOSIS — E039 Hypothyroidism, unspecified: Secondary | ICD-10-CM | POA: Diagnosis not present

## 2024-12-18 DIAGNOSIS — R21 Rash and other nonspecific skin eruption: Secondary | ICD-10-CM | POA: Insufficient documentation

## 2024-12-18 DIAGNOSIS — L304 Erythema intertrigo: Secondary | ICD-10-CM | POA: Diagnosis not present

## 2024-12-18 DIAGNOSIS — Z0001 Encounter for general adult medical examination with abnormal findings: Secondary | ICD-10-CM

## 2024-12-18 DIAGNOSIS — Z0000 Encounter for general adult medical examination without abnormal findings: Secondary | ICD-10-CM

## 2024-12-18 DIAGNOSIS — I10 Essential (primary) hypertension: Secondary | ICD-10-CM

## 2024-12-18 LAB — POCT URINALYSIS DIP (CLINITEK)
Bilirubin, UA: NEGATIVE
Blood, UA: NEGATIVE
Glucose, UA: NEGATIVE mg/dL
Ketones, POC UA: NEGATIVE mg/dL
Leukocytes, UA: NEGATIVE
Nitrite, UA: NEGATIVE
POC PROTEIN,UA: NEGATIVE
Spec Grav, UA: 1.01
Urobilinogen, UA: 0.2 U/dL
pH, UA: 6

## 2024-12-18 MED ORDER — DESOXIMETASONE 0.25 % EX CREA: TOPICAL_CREAM | CUTANEOUS | 4 refills | Status: AC

## 2024-12-18 MED ORDER — MECLIZINE HCL 12.5 MG PO TABS: 12.5000 mg | ORAL_TABLET | Freq: Three times a day (TID) | ORAL | 0 refills | Status: AC | PRN

## 2024-12-18 MED ORDER — GLUCOSE BLOOD VI STRP: ORAL_STRIP | 3 refills | Status: AC

## 2024-12-18 MED ORDER — ACCU-CHEK GUIDE W/DEVICE KIT: 1.0000 | PACK | Freq: Every day | 0 refills | Status: AC

## 2024-12-18 NOTE — Assessment & Plan Note (Signed)
 Continue current medications and continue f/u with Endocrinology, will check thyroid  levels due to having dizziness.

## 2024-12-18 NOTE — Assessment & Plan Note (Addendum)
 Recent dizziness and vomiting, possibly related to dehydration or orthostatic changes. - Advised on hydration and adjusting fluid intake to avoid nocturia. - orthostats are normal.

## 2024-12-18 NOTE — Assessment & Plan Note (Signed)
 Increased appetite and dietary concerns. Risk of progression to diabetes if dietary habits are not improved. - Provided dietary guidance to reduce carbohydrate intake.

## 2024-12-18 NOTE — Progress Notes (Signed)
 LILLETTE Kristeen JINNY Gladis, CMA,acting as a neurosurgeon for [name], FNP.,have documented all relevant documentation on the behalf of [name], FNP,as directed by  [name], FNP while in the presence of [name], FNP.  Subjective:    Patient ID: Nancy [name] , female    DOB: 27-May-1951 , 73 y.o.   MRN: [id number]  Chief Complaint  Patient presents with   Annual Exam    Patient presents today for HM, Patient reports compliance with medication. Patient denies any chest pain, SOB, or headaches. Patient has no concerns today.     HPI  Discussed the use of AI scribe software for clinical note transcription with the patient, who gave verbal consent to proceed.  History of Present Illness Nancy [name] is a 73 year old female who presents for an annual physical exam.  She experiences dizziness upon standing, described as 'vertigo-like', and has vomited on one occasion. The dizziness occurs when she gets up too quickly and is managed by getting up slowly. She has not taken any medication for vertigo. She drinks about four bottles of water a day but stops by 9 PM to avoid frequent nighttime urination.  She has a history of elevated blood pressure readings, with a recent clinic reading of 140/80 mmHg. At home, her blood pressure is typically around 129/77 mmHg. She occasionally forgets to take her blood pressure medication.  She reports increased hunger and is trying to adjust her diet to support kidney health by reducing cream, nuts, and protein intake. She is confused about dietary recommendations for kidney health and has not seen a nutritionist.  She has decreased physical activity due to foot pain, leading to a recommendation from her podiatrist to stop using her treadmill. She currently walks about 2,000 steps a day and lives in a safe neighborhood.  She has a history of breast reduction surgery, resulting in inverted nipples. She experiences a recurring rash that flares up with certain foods,  particularly carbohydrates, and uses Topicort  cream for management.  She has a BMI of 41 and is concerned about weight management, noting insufficient physical activity and consumption of carbohydrates and sweets.  No abnormal vaginal bleeding. She checks her breasts regularly for lumps or bumps.  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   Cataract 2010   Complication of anesthesia    pt states she woke up during a tonsillectomy when she was 28   Depression    Hyperlipidemia    Hypertension    Hypothyroidism 1980   Panniculitis 05/20/2021   Pre-diabetes    Prediabetes    Thyroid  disease      Family History  Problem Relation Age of Onset   Diabetes Mother    Hypertension Mother    Hypertension Father    Heart disease Father    Diabetes Sister    Kidney failure Sister    Diabetes Brother    Kidney disease Brother    Diabetes Sister    Diabetes Sister    Asthma Sister    Cancer Maternal Grandfather    Cancer Paternal Grandfather     Current Medications[1]   Allergies[2]    The patient states she uses post menopausal status for birth control. No LMP recorded. Patient is postmenopausal.. . Negative for: breast discharge, breast lump(s), breast pain and breast self exam. Associated symptoms include abnormal vaginal bleeding. Pertinent  negatives include abnormal bleeding (hematology), anxiety, decreased libido, depression, difficulty falling sleep, dyspareunia, history of infertility, nocturia, sexual dysfunction, sleep disturbances, urinary incontinence, urinary urgency, vaginal discharge and vaginal itching. Diet regular.The patient states her exercise level is minimal she is getting only about 2000 steps per day, she has gotten rid of her treadmill at the direction of her podiatrist.   The patient's tobacco use is: Tobacco Use History[3]. She has been exposed  to passive smoke. The patient's alcohol use is:  Social History   Substance and Sexual Activity  Alcohol Use Not Currently   Comment: rare    Review of Systems  Constitutional: Negative.   HENT: Negative.    Eyes: Negative.   Respiratory: Negative.    Cardiovascular: Negative.   Gastrointestinal: Negative.   Endocrine: Negative.   Genitourinary: Negative.   Musculoskeletal: Negative.   Skin: Negative.   Allergic/Immunologic: Negative.   Neurological: Negative.   Hematological: Negative.   Psychiatric/Behavioral: Negative.       Today's Vitals   12/18/24 0834  BP: (!) 140/80  Pulse: 99  Temp: 98.5 F (36.9 C)  TempSrc: Oral  Weight: 249 lb 12.8 oz (113.3 kg)  Height: 5' 5 (1.651 m)  PainSc: 0-No pain   Body mass index is 41.57 kg/m.  Wt Readings from Last 3 Encounters:  12/18/24 249 lb 12.8 oz (113.3 kg)  08/28/24 248 lb (112.5 kg)  06/08/24 248 lb (112.5 kg)    Orthostatic Vitals for the past 48 hrs (Last 6 readings):  Orthostatic BP Orthostatic Pulse BP Pulse  12/18/24 0834 -- -- (!) 140/80 99  12/18/24 0925 134/70 61 -- --  12/18/24 0927 134/74 66 -- --  12/18/24 0928 130/78 71 -- --    Objective:  Physical Exam Vitals and nursing note reviewed.  Constitutional:      General: She is not in acute distress.    Appearance: Normal appearance. She is well-developed. She is obese.  HENT:     Head: Normocephalic and atraumatic.     Right Ear: Hearing, tympanic membrane, ear canal and external ear normal. There is no impacted cerumen.     Left Ear: Hearing, tympanic membrane, ear canal and external ear normal. There is no impacted cerumen.     Nose: Nose normal.     Mouth/Throat:     Mouth: Mucous membranes are moist.  Eyes:     General: Lids are normal.     Extraocular Movements: Extraocular movements intact.     Conjunctiva/sclera: Conjunctivae normal.     Pupils: Pupils are equal, round, and reactive to light.     Funduscopic exam:    Right eye: No  papilledema.        Left eye: No papilledema.  Neck:     Thyroid : No thyroid  mass.     Vascular: No carotid bruit.  Cardiovascular:     Rate and Rhythm: Normal rate and regular rhythm.     Pulses: Normal pulses.     Heart sounds: Normal heart sounds. No murmur heard. Pulmonary:     Effort: Pulmonary effort is normal. No respiratory distress.     Breath sounds: Normal breath sounds. No wheezing.  Chest:     Chest wall: No mass.  Breasts:    Tanner Score is 5.     Right: Normal. No mass or tenderness.     Left: Normal. No mass or tenderness.  Abdominal:     General: Abdomen is flat. Bowel sounds are normal. There is no distension.  Palpations: Abdomen is soft.     Tenderness: There is no abdominal tenderness.  Genitourinary:    Labia:        Right: No lesion.      Rectum: Guaiac result negative.     Comments: Left side labia majora has raised vesicle.  Musculoskeletal:        General: No swelling. Normal range of motion.     Cervical back: Full passive range of motion without pain, normal range of motion and neck supple.     Right lower leg: No edema.     Left lower leg: No edema.  Lymphadenopathy:     Upper Body:     Right upper body: No supraclavicular, axillary or pectoral adenopathy.     Left upper body: No supraclavicular, axillary or pectoral adenopathy.  Skin:    General: Skin is warm and dry.     Capillary Refill: Capillary refill takes less than 2 seconds.     Comments: Healing surgical scars to panus  Neurological:     General: No focal deficit present.     Mental Status: She is alert and oriented to person, place, and time.     Cranial Nerves: No cranial nerve deficit.     Sensory: No sensory deficit.     Motor: No weakness.  Psychiatric:        Mood and Affect: Mood normal.        Behavior: Behavior normal.        Thought Content: Thought content normal.        Judgment: Judgment normal.      Assessment And Plan:     Encounter for annual health  examination Assessment & Plan: Need for increased physical activity and dietary modifications to support overall health. - Provided information on kidney-friendly diet. - Encouraged participation in exercise programs to improve physical activity levels.   Essential hypertension Assessment & Plan: Blood pressure elevated at 140/80 mmHg in office, well-controlled at home. Possible orthostatic component due to dizziness upon standing. - Rechecked blood pressure in different positions for orthostatic changes. - Continue current antihypertensive regimen. - No change with EKG from previous  Orders: -     EKG 12-Lead -     POCT URINALYSIS DIP (CLINITEK) -     Microalbumin / creatinine urine ratio -     CMP14+EGFR  Acquired hypothyroidism Assessment & Plan: Continue current medications and continue f/u with Endocrinology, will check thyroid  levels due to having dizziness.   Orders: -     TSH + free T4  Prediabetes Assessment & Plan: Increased appetite and dietary concerns. Risk of progression to diabetes if dietary habits are not improved. - Provided dietary guidance to reduce carbohydrate intake.  Orders: -     Hemoglobin A1c -     Accu-Chek Guide; 1 Device by Does not apply route daily.  Dispense: 1 kit; Refill: 0 -     Glucose Blood; Check blood sugar daily  Dispense: 100 each; Refill: 3 -     Amb Referral To Provider Referral Exercise Program (P.R.E.P)  Mixed hyperlipidemia -     Lipid panel -     Amb Referral To Provider Referral Exercise Program (P.R.E.P)  Other long term (current) drug therapy -     CBC with Differential/Platelet  Dizziness Assessment & Plan: Recent dizziness and vomiting, possibly related to dehydration or orthostatic changes. - Advised on hydration and adjusting fluid intake to avoid nocturia. - orthostats are normal.   Orders: -  Meclizine HCl; Take 1 tablet (12.5 mg total) by mouth 3 (three) times daily as needed for dizziness.  Dispense: 30  tablet; Refill: 0  Intertrigo Assessment & Plan: Recurrent rash, possibly related to dietary factors. Previous use of Topicort  cream with variable effectiveness. - Prescribed Topicort  cream for rash management.  Orders: -     Desoximetasone ; Apply to skin daily safe to use on face and skin folds  Dispense: 100 g; Refill: 4  Obesity, morbid, BMI 40.0-49.9 (HCC) Assessment & Plan: BMI 41. Limited physical activity due to previous podiatrist recommendation. Need to increase physical activity for weight loss. She is currently only walking about 2000 steps a day - Referred to PREP program for structured exercise and nutritional guidance. - Encouraged gradual increase in daily steps, aiming for 10,000 steps per day.  Orders: -     Amb Referral To Provider Referral Exercise Program (P.R.E.P)     Return for 1 year physical, 6 month bp check. Patient was given opportunity to ask questions. Patient verbalized understanding of the plan and was able to repeat key elements of the plan. All questions were answered to their satisfaction.   [name], FNP  I, [name], FNP, have reviewed all documentation for this visit. The documentation on 12/18/2024 for the exam, diagnosis, procedures, and orders are all accurate and complete.      [1]  Current Outpatient Medications:    atorvastatin  (LIPITOR) 20 MG tablet, TAKE 1 TABLET BY MOUTH EVERY DAY AT BEDTIME FOR CHOLESTROL, Disp: 90 tablet, Rfl: 1   Cholecalciferol 125 MCG (5000 UT) TABS, Take 5,000 Units by mouth daily., Disp: , Rfl:    Flaxseed, Linseed, (FLAXSEED OIL) 1000 MG CAPS, Take by mouth., Disp: , Rfl:    GLUCOSAMINE CHONDROITIN COMPLX PO, Take 1 capsule by mouth daily. 1500 mg/1200 mg, Disp: , Rfl:    glucose blood test strip, Check blood sugar daily, Disp: 100 each, Rfl: 3   Lactobacillus (PROBIOTIC ACIDOPHILUS PO), Take 0.5 mg by mouth daily., Disp: , Rfl:    Lancets (ONETOUCH DELICA PLUS LANCET30G) MISC, USE TO CHECK BLOOD  SUGAR ONE DAILY AS DIRECTED, Disp: 100 each, Rfl: 2   levothyroxine  (SYNTHROID ) 150 MCG tablet, TAKE 1 TABLET BY MOUTH BEFORE BREAKFAST DAILY, Disp: 90 tablet, Rfl: 1   Magnesium 250 MG TABS, Take 250 mg by mouth daily., Disp: , Rfl:    meclizine (ANTIVERT) 12.5 MG tablet, Take 1 tablet (12.5 mg total) by mouth 3 (three) times daily as needed for dizziness., Disp: 30 tablet, Rfl: 0   Multiple Vitamins-Minerals (MULTIVITAMIN WOMEN 50+) TABS, Take 1 tablet by mouth daily., Disp: , Rfl:    spironolactone  (ALDACTONE ) 50 MG tablet, TAKE 1 TABLET BY MOUTH EVERY DAY, Disp: 90 tablet, Rfl: 1   Blood Glucose Monitoring Suppl (ACCU-CHEK GUIDE) w/Device KIT, 1 Device by Does not apply route daily., Disp: 1 kit, Rfl: 0   desoximetasone  (TOPICORT ) 0.25 % cream, Apply to skin daily safe to use on face and skin folds, Disp: 100 g, Rfl: 4 [2] No Known Allergies [3]  Social History Tobacco Use  Smoking Status Former   Current packs/day: 0.00   Average packs/day: 1 pack/day for 1 year (1.0 ttl pk-yrs)   Types: Cigarettes   Start date: 12/22/2007   Quit date: 12/21/2008   Years since quitting: 16.0  Smokeless Tobacco Never

## 2024-12-18 NOTE — Assessment & Plan Note (Signed)
 Recurrent rash, possibly related to dietary factors. Previous use of Topicort  cream with variable effectiveness. - Prescribed Topicort  cream for rash management.

## 2024-12-18 NOTE — Assessment & Plan Note (Addendum)
 Blood pressure elevated at 140/80 mmHg in office, well-controlled at home. Possible orthostatic component due to dizziness upon standing. - Rechecked blood pressure in different positions for orthostatic changes. - Continue current antihypertensive regimen. - No change with EKG from previous

## 2024-12-18 NOTE — Assessment & Plan Note (Signed)
 Need for increased physical activity and dietary modifications to support overall health. - Provided information on kidney-friendly diet. - Encouraged participation in exercise programs to improve physical activity levels.

## 2024-12-18 NOTE — Patient Instructions (Signed)
 Health Maintenance  Topic Date Due   COVID-19 Vaccine (9 - Pfizer risk 2025-26 season) 02/25/2025   Medicare Annual Wellness Visit  05/03/2025   Breast Cancer Screening  08/09/2026   Colon Cancer Screening  09/01/2028   DTaP/Tdap/Td vaccine (2 - Td or Tdap) 01/10/2030   Pneumococcal Vaccine for age over 46  Completed   Flu Shot  Completed   Osteoporosis screening with Bone Density Scan  Completed   Hepatitis C Screening  Completed   Zoster (Shingles) Vaccine  Completed   Meningitis B Vaccine  Aged Out    VISIT SUMMARY: Today, you came in for your annual physical exam. We discussed several health concerns, including your dizziness, blood pressure, diet, physical activity, and recurring rash. We also reviewed your general health maintenance and provided recommendations to support your overall well-being.  YOUR PLAN: -ESSENTIAL HYPERTENSION: Essential hypertension means you have high blood pressure. Your blood pressure was elevated in the office but is well-controlled at home. We checked your blood pressure in different positions to see if it changes when you stand up. Please continue taking your current blood pressure medication as prescribed.  -PREDIABETES: Prediabetes means your blood sugar levels are higher than normal but not high enough to be classified as diabetes. You mentioned increased hunger and dietary concerns. We provided guidance on reducing your carbohydrate intake to help manage your blood sugar levels.  -MORBID OBESITY: Morbid obesity means having a very high body weight. Your BMI is 41, and your physical activity is limited due to foot pain. We referred you to the Prep program for structured exercise and nutritional guidance. We also encouraged you to gradually increase your daily steps, aiming for 10,000 steps per day.  -DIZZINESS AND VERTIGO: Dizziness and vertigo refer to feelings of spinning or lightheadedness. Your recent dizziness and vomiting may be related to  dehydration or changes in your blood pressure when you stand up. We advised you to stay hydrated and adjust your fluid intake to avoid frequent nighttime urination.  -INTERTRIGO: Intertrigo is a rash that occurs in skin folds. You have a recurring rash that flares up with certain foods. We prescribed Topicort  cream to help manage the rash.  -GENERAL HEALTH MAINTENANCE: For your overall health, we discussed the importance of increasing physical activity and making dietary modifications. We provided information on a kidney-friendly diet and encouraged you to participate in exercise programs to improve your physical activity levels.  INSTRUCTIONS: Please follow up with the Prep program for structured exercise and nutritional guidance. Continue monitoring your blood pressure at home and take your medication as prescribed. If you experience any new or worsening symptoms, please contact our office.  Contains text generated by [employer].

## 2024-12-18 NOTE — Assessment & Plan Note (Addendum)
 BMI 41. Limited physical activity due to previous podiatrist recommendation. Need to increase physical activity for weight loss. She is currently only walking about 2000 steps a day - Referred to PREP program for structured exercise and nutritional guidance. - Encouraged gradual increase in daily steps, aiming for 10,000 steps per day.

## 2024-12-19 ENCOUNTER — Ambulatory Visit: Payer: Self-pay | Admitting: Nurse Practitioner

## 2024-12-19 LAB — CBC WITH DIFFERENTIAL/PLATELET
Basophils Absolute: 0.1 x10E3/uL (ref 0.0–0.2)
Basos: 1 %
EOS (ABSOLUTE): 0.1 x10E3/uL (ref 0.0–0.4)
Eos: 2 %
Hematocrit: 44.5 % (ref 34.0–46.6)
Hemoglobin: 14.6 g/dL (ref 11.1–15.9)
Immature Grans (Abs): 0 x10E3/uL (ref 0.0–0.1)
Immature Granulocytes: 0 %
Lymphocytes Absolute: 1.4 x10E3/uL (ref 0.7–3.1)
Lymphs: 20 %
MCH: 30.6 pg (ref 26.6–33.0)
MCHC: 32.8 g/dL (ref 31.5–35.7)
MCV: 93 fL (ref 79–97)
Monocytes Absolute: 0.5 x10E3/uL (ref 0.1–0.9)
Monocytes: 7 %
Neutrophils Absolute: 4.9 x10E3/uL (ref 1.4–7.0)
Neutrophils: 70 %
Platelets: 244 x10E3/uL (ref 150–450)
RBC: 4.77 x10E6/uL (ref 3.77–5.28)
RDW: 13.4 % (ref 11.7–15.4)
WBC: 7 x10E3/uL (ref 3.4–10.8)

## 2024-12-19 LAB — CMP14+EGFR
ALT: 18 IU/L (ref 0–32)
AST: 26 IU/L (ref 0–40)
Albumin: 4.7 g/dL (ref 3.8–4.8)
Alkaline Phosphatase: 75 IU/L (ref 49–135)
BUN/Creatinine Ratio: 16 (ref 12–28)
BUN: 18 mg/dL (ref 8–27)
Bilirubin Total: 0.9 mg/dL (ref 0.0–1.2)
CO2: 24 mmol/L (ref 20–29)
Calcium: 9.9 mg/dL (ref 8.7–10.3)
Chloride: 102 mmol/L (ref 96–106)
Creatinine, Ser: 1.14 mg/dL — ABNORMAL HIGH (ref 0.57–1.00)
Globulin, Total: 2.8 g/dL (ref 1.5–4.5)
Glucose: 78 mg/dL (ref 70–99)
Potassium: 4.5 mmol/L (ref 3.5–5.2)
Sodium: 142 mmol/L (ref 134–144)
Total Protein: 7.5 g/dL (ref 6.0–8.5)
eGFR: 51 mL/min/1.73 — ABNORMAL LOW

## 2024-12-19 LAB — LIPID PANEL
Chol/HDL Ratio: 2.8 ratio (ref 0.0–4.4)
Cholesterol, Total: 166 mg/dL (ref 100–199)
HDL: 59 mg/dL
LDL Chol Calc (NIH): 91 mg/dL (ref 0–99)
Triglycerides: 89 mg/dL (ref 0–149)
VLDL Cholesterol Cal: 16 mg/dL (ref 5–40)

## 2024-12-19 LAB — MICROALBUMIN / CREATININE URINE RATIO
Creatinine, Urine: 62.2 mg/dL
Microalb/Creat Ratio: 56 mg/g{creat} — ABNORMAL HIGH (ref 0–29)
Microalbumin, Urine: 34.9 ug/mL

## 2024-12-19 LAB — HEMOGLOBIN A1C
Est. average glucose Bld gHb Est-mCnc: 120 mg/dL
Hgb A1c MFr Bld: 5.8 % — ABNORMAL HIGH (ref 4.8–5.6)

## 2024-12-19 LAB — TSH+FREE T4
Free T4: 1.65 ng/dL (ref 0.82–1.77)
TSH: 0.917 u[IU]/mL (ref 0.450–4.500)

## 2024-12-20 ENCOUNTER — Ambulatory Visit: Admitting: Clinical

## 2024-12-20 ENCOUNTER — Telehealth: Payer: Self-pay

## 2024-12-20 NOTE — Telephone Encounter (Signed)
 Called  re: PREP program referral, left voicemail requesting return call.

## 2024-12-25 ENCOUNTER — Telehealth: Payer: Self-pay

## 2024-12-25 ENCOUNTER — Ambulatory Visit (INDEPENDENT_AMBULATORY_CARE_PROVIDER_SITE_OTHER): Admitting: Clinical

## 2024-12-25 DIAGNOSIS — F4323 Adjustment disorder with mixed anxiety and depressed mood: Secondary | ICD-10-CM | POA: Diagnosis not present

## 2024-12-25 NOTE — Telephone Encounter (Signed)
 Returned her call, explained PREP, she want morning class, offered next class at [facility] on 1/19, every M/W at 12; she will call me back with her decision.
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
## 2024-12-25 NOTE — Progress Notes (Signed)
   Darice Seats, LCSW

## 2025-01-08 ENCOUNTER — Telehealth: Payer: Self-pay

## 2025-01-08 NOTE — Telephone Encounter (Signed)
 Called to discuss PREP schedule for Feb; would like to start the late Feb class; will contact mid-Feb to confirm dates/times and set up assessment visit.

## 2025-01-19 ENCOUNTER — Ambulatory Visit: Admitting: Clinical

## 2025-06-19 ENCOUNTER — Ambulatory Visit: Payer: Self-pay | Admitting: Nurse Practitioner

## 2025-06-20 ENCOUNTER — Ambulatory Visit: Payer: Self-pay

## 2025-12-19 ENCOUNTER — Encounter: Payer: Self-pay | Admitting: Nurse Practitioner
